# Patient Record
Sex: Female | Born: 1935 | Race: Black or African American | Hispanic: No | Marital: Single | Smoking: Former smoker
Health system: Southern US, Community
[De-identification: ages and names within clinical notes are randomized; demographics above are authoritative.]

## PROBLEM LIST (undated history)

## (undated) DIAGNOSIS — I2510 Atherosclerotic heart disease of native coronary artery without angina pectoris: Secondary | ICD-10-CM

## (undated) DIAGNOSIS — I10 Essential (primary) hypertension: Secondary | ICD-10-CM

## (undated) DIAGNOSIS — E785 Hyperlipidemia, unspecified: Secondary | ICD-10-CM

## (undated) HISTORY — DX: Essential (primary) hypertension: I10

## (undated) HISTORY — PX: ABDOMINAL HYSTERECTOMY: SHX81

## (undated) HISTORY — DX: Hyperlipidemia, unspecified: E78.5

## (undated) HISTORY — PX: CORONARY ARTERY BYPASS GRAFT: SHX141

## (undated) HISTORY — DX: Atherosclerotic heart disease of native coronary artery without angina pectoris: I25.10

---

## 2006-07-22 HISTORY — PX: CORONARY ARTERY BYPASS GRAFT: SHX141

## 2016-07-22 HISTORY — PX: CORONARY ANGIOPLASTY WITH STENT PLACEMENT: SHX49

## 2016-11-14 ENCOUNTER — Encounter: Payer: Self-pay | Admitting: Gastroenterology

## 2016-12-19 ENCOUNTER — Other Ambulatory Visit: Payer: Self-pay

## 2016-12-19 ENCOUNTER — Ambulatory Visit (INDEPENDENT_AMBULATORY_CARE_PROVIDER_SITE_OTHER): Payer: Medicare Other | Admitting: Gastroenterology

## 2016-12-19 ENCOUNTER — Encounter: Payer: Self-pay | Admitting: Gastroenterology

## 2016-12-19 DIAGNOSIS — D649 Anemia, unspecified: Secondary | ICD-10-CM | POA: Diagnosis not present

## 2016-12-19 DIAGNOSIS — R194 Change in bowel habit: Secondary | ICD-10-CM

## 2016-12-19 DIAGNOSIS — R634 Abnormal weight loss: Secondary | ICD-10-CM

## 2016-12-19 NOTE — Patient Instructions (Addendum)
IF YOU CONSUME DAIRY PRODUCTS(CHEESE, ICE CREAM), ADD LACTASE 3 PILLS WITH MEALS UP TO THREE TIMES A DAY.  STOP TAKING IRON.  TAKE CALTRATE 30 MINS AFTER SUPPER.  I WILL GET YOUR RECORDS FROM [facility].  COMPLETE COLONOSCOPY AND UPPER ENDOSCOPY WITHIN 3-4 WEEKS TO EVALUATE WEIGHT LOSS AND LOW BLOOD COUNT.  FOLLOW UP IN 3 MOS.

## 2016-12-19 NOTE — Progress Notes (Signed)
Subjective:    Patient ID: Julia Hart, female    DOB: 1926/09/23, 83 y.o.   MRN: [id number]  [name], MD  HPI Symptoms since Feb 2018. MOST TIME IT'S A DULL ACHE(ALL OVER) AND SOMETIMES BURNING. FIRST STARTED IT WAS CRAMPING AND NOW YOU FEEL PAIN IN YOUR RECTUM AND BELLY. PAIN HAPPENS OFF AND ON. STOOL SOFTENER TWICE A DAY MAKES HER HAVE A BM. HAPPENS: NO BAD EPISODE SINCE SAT. NO RADIATION.THOUGHT IT WAS CONSTIPATION. MAY MISS A DAY. HAD LOOSE STOOLS ALL THE TIME AFTER LINZESS. COULDN'T EAT AT ALL. WAS HAVING RABBIT BALLS BEFORE. HAD PROBLEMS WITH CONSTIPATION JUST SINCE FEB 2018.  MAY FEEL NAUSEATED. NO TASTE FOR WATER OR ANYTHING ELSE. EATS LIKE A BIRD. STOP EATING BECAUSE LOST THE TASTE FOR FOOD. WEIGHT: 167 LBS. THYROID TEST-NO. BEEN ON IRON PILL JUST THIS YEAR. AND STOPPED AND THEN RE-STARTED. CALTRATE IN THE EVENING. HEARTBURN: OCCASIONAL HEARTBURN/INDIGESTION. LAST TCS/EGD > 10 YEARS AGO. WAS GETTING B12 SHOTS. LAST BL;OOD DRAW LAST WEEK.  PT DENIES FEVER, CHILLS, HEMATOCHEZIA, vomiting, melena, CHEST PAIN, SHORTNESS OF BREATH,  CHANGE IN BOWEL IN HABITS, problems swallowing, problems with sedation, heartburn or indigestion.  Past Medical History:  Diagnosis Date  . CAD (coronary artery disease)   . HTN (hypertension)   . Hyperlipidemia     Past Surgical History:  Procedure Laterality Date  . ABDOMINAL HYSTERECTOMY    . CORONARY ARTERY BYPASS GRAFT      No Known Allergies  Current Outpatient Prescriptions  Medication Sig Dispense Refill  . amLODipine (NORVASC) 10 MG tablet Take 10 mg by mouth daily.    Marland Kitchen aspirin EC 81 MG tablet Take 81 mg by mouth daily.    Marland Kitchen atorvastatin (LIPITOR) 20 MG tablet Take 20 mg by mouth daily.    . Calcium Carbonate-Vitamin D (CALTRATE 600+D PO) Take by mouth.    . docusate sodium (COLACE) 100 MG capsule Take 100 mg by mouth 2 (two) times daily.    Marland Kitchen epoetin alfa (EPOGEN,PROCRIT) 28413 UNIT/ML injection Inject into the skin.    .  ferrous sulfate 325 (65 FE) MG tablet Take 325 mg by mouth daily with breakfast.    . latanoprost (XALATAN) 0.005 % ophthalmic solution Place 1 drop into both eyes at bedtime.    . metoprolol tartrate (LOPRESSOR) 25 MG tablet Take 25 mg by mouth 2 (two) times daily.    . Multiple Vitamin (MULTIVITAMIN) capsule Take 1 capsule by mouth daily.    . potassium chloride SA (K-DUR,KLOR-CON) 20 MEQ tablet Take 20 mEq by mouth once.    . ranitidine (ZANTAC) 150 MG tablet Take 150 mg by mouth 2 (two) times daily.    . valsartan-hydrochlorothiazide (DIOVAN-HCT) 160-12.5 MG tablet Take 1 tablet by mouth daily.     Family History  Problem Relation Age of Onset  . Colon cancer Neg Hx   . Colon polyps Neg Hx     Social History   Social History  . Marital status: Single    Spouse name: N/A  . Number of children: N/A  . Years of education: N/A   Social History Main Topics  . Smoking status: Former Research scientist (life sciences)  . Smokeless tobacco: Never Used  . Alcohol use No  . Drug use: No  . Sexual activity: Not Asked   Other Topics Concern  . None   Social History Narrative   QUIT SMOKING LONG TIME AGO. WORKED [employer]. NO KIDS. NIECE HELPS CARE FOR HER.   Review of Systems PER  HPI OTHERWISE ALL SYSTEMS ARE NEGATIVE.    Objective:   Physical Exam  Constitutional: She is oriented to person, place, and time. She appears well-developed and well-nourished. No distress.  HENT:  Head: Normocephalic and atraumatic.  Mouth/Throat: Oropharynx is clear and moist. No oropharyngeal exudate.  Eyes: Pupils are equal, round, and reactive to light. No scleral icterus.  Neck: Normal range of motion. Neck supple.  Cardiovascular: Normal rate, regular rhythm and normal heart sounds.   Pulmonary/Chest: Effort normal and breath sounds normal. No respiratory distress.  Abdominal: Soft. Bowel sounds are normal. She exhibits no distension. There is no tenderness.  Musculoskeletal: She exhibits no edema.    Lymphadenopathy:    She has no cervical adenopathy.  Neurological: She is alert and oriented to person, place, and time.  NO  NEW FOCAL DEFICITS, HARD OF HEARING  Psychiatric: She has a normal mood and affect.  Vitals reviewed.     Assessment & Plan:

## 2016-12-19 NOTE — Progress Notes (Signed)
cc'ed to pcp °

## 2016-12-19 NOTE — Assessment & Plan Note (Addendum)
NORMAL TO CONSTIPATION-ASSOCIATED WITH GI UPSET DUE TO LACTOSE, IRON,  AND CALTRATE.   IF YOU CONSUME DAIRY PRODUCTS(CHEESE, ICE CREAM), ADD LACTASE 3 PILLS WITH MEALS UP TO THREE TIMES A DAY. STOP TAKING IRON. TAKE CALTRATE 30 MINS AFTER SUPPER. I WILL GET YOUR RECORDS FROM [facility]. FOLLOW UP IN 3 MOS. Marland Kitchen

## 2016-12-19 NOTE — Progress Notes (Signed)
ON RECALL  °

## 2016-12-19 NOTE — Assessment & Plan Note (Signed)
LIKELY MULTIFACTORIAL-POLYPS AND CRI.  STOP TAKING IRON. TAKE CALTRATE 30 MINS AFTER SUPPER. I WILL GET YOUR RECORDS FROM [facility]. COMPLETE COLONOSCOPY AND UPPER ENDOSCOPY WITHIN 3-4 WEEKS. DISCUSSED PROCEDURE, BENEFITS, & RISKS: < 1% chance of medication reaction, bleeding, perforation, or rupture of spleen/liver. MOVIPREP SPLIT-FENTANYL/VERSED FOR SEDATION.  FOLLOW UP IN 3 MOS.

## 2017-01-09 ENCOUNTER — Telehealth: Payer: Self-pay

## 2017-01-09 NOTE — Telephone Encounter (Signed)
Talked with Hassan Rowan and she is aware to have her there Monday at 12:45 pm

## 2017-01-09 NOTE — Telephone Encounter (Signed)
Talked with patient about moving her TCS up on 01/13/17 and she asked that I call her niece Hassan Rowan. I left a message for Hassan Rowan to call me back.

## 2017-01-13 ENCOUNTER — Encounter (HOSPITAL_COMMUNITY): Payer: Self-pay

## 2017-01-13 ENCOUNTER — Encounter (HOSPITAL_COMMUNITY)
Admission: RE | Disposition: A | Discharge status: Home / Self Care | Payer: Self-pay | Source: Ambulatory Visit | Attending: Gastroenterology

## 2017-01-13 ENCOUNTER — Ambulatory Visit (HOSPITAL_COMMUNITY)
Admission: RE | Admit: 2017-01-13 | Discharge: 2017-01-13 | Disposition: A | Discharge status: Home / Self Care | Payer: Medicare Other | Source: Ambulatory Visit | Attending: Gastroenterology | Admitting: Gastroenterology

## 2017-01-13 DIAGNOSIS — Z87891 Personal history of nicotine dependence: Secondary | ICD-10-CM | POA: Insufficient documentation

## 2017-01-13 DIAGNOSIS — N189 Chronic kidney disease, unspecified: Secondary | ICD-10-CM | POA: Insufficient documentation

## 2017-01-13 DIAGNOSIS — K2950 Unspecified chronic gastritis without bleeding: Secondary | ICD-10-CM | POA: Insufficient documentation

## 2017-01-13 DIAGNOSIS — Z90710 Acquired absence of both cervix and uterus: Secondary | ICD-10-CM | POA: Insufficient documentation

## 2017-01-13 DIAGNOSIS — I129 Hypertensive chronic kidney disease with stage 1 through stage 4 chronic kidney disease, or unspecified chronic kidney disease: Secondary | ICD-10-CM | POA: Diagnosis not present

## 2017-01-13 DIAGNOSIS — E538 Deficiency of other specified B group vitamins: Secondary | ICD-10-CM | POA: Insufficient documentation

## 2017-01-13 DIAGNOSIS — D649 Anemia, unspecified: Secondary | ICD-10-CM | POA: Diagnosis not present

## 2017-01-13 DIAGNOSIS — D631 Anemia in chronic kidney disease: Secondary | ICD-10-CM | POA: Diagnosis not present

## 2017-01-13 DIAGNOSIS — K2970 Gastritis, unspecified, without bleeding: Secondary | ICD-10-CM

## 2017-01-13 DIAGNOSIS — D124 Benign neoplasm of descending colon: Secondary | ICD-10-CM | POA: Insufficient documentation

## 2017-01-13 DIAGNOSIS — R1013 Epigastric pain: Secondary | ICD-10-CM | POA: Diagnosis not present

## 2017-01-13 DIAGNOSIS — Z951 Presence of aortocoronary bypass graft: Secondary | ICD-10-CM | POA: Insufficient documentation

## 2017-01-13 DIAGNOSIS — K648 Other hemorrhoids: Secondary | ICD-10-CM | POA: Insufficient documentation

## 2017-01-13 DIAGNOSIS — Z7982 Long term (current) use of aspirin: Secondary | ICD-10-CM | POA: Insufficient documentation

## 2017-01-13 DIAGNOSIS — E785 Hyperlipidemia, unspecified: Secondary | ICD-10-CM | POA: Insufficient documentation

## 2017-01-13 DIAGNOSIS — I2510 Atherosclerotic heart disease of native coronary artery without angina pectoris: Secondary | ICD-10-CM | POA: Diagnosis not present

## 2017-01-13 DIAGNOSIS — Q438 Other specified congenital malformations of intestine: Secondary | ICD-10-CM | POA: Insufficient documentation

## 2017-01-13 DIAGNOSIS — R634 Abnormal weight loss: Secondary | ICD-10-CM | POA: Diagnosis not present

## 2017-01-13 DIAGNOSIS — K635 Polyp of colon: Secondary | ICD-10-CM

## 2017-01-13 DIAGNOSIS — R109 Unspecified abdominal pain: Secondary | ICD-10-CM | POA: Diagnosis not present

## 2017-01-13 DIAGNOSIS — K5730 Diverticulosis of large intestine without perforation or abscess without bleeding: Secondary | ICD-10-CM | POA: Insufficient documentation

## 2017-01-13 HISTORY — PX: ESOPHAGOGASTRODUODENOSCOPY: SHX5428

## 2017-01-13 HISTORY — PX: BIOPSY: SHX5522

## 2017-01-13 HISTORY — PX: COLONOSCOPY: SHX5424

## 2017-01-13 LAB — CBC
HCT: 26.1 % — ABNORMAL LOW (ref 36.0–46.0)
HEMOGLOBIN: 8.7 g/dL — AB (ref 12.0–15.0)
MCH: 28.1 pg (ref 26.0–34.0)
MCHC: 33.3 g/dL (ref 30.0–36.0)
MCV: 84.2 fL (ref 78.0–100.0)
Platelets: 210 10*3/uL (ref 150–400)
RBC: 3.1 MIL/uL — AB (ref 3.87–5.11)
RDW: 14.2 % (ref 11.5–15.5)
WBC: 5.9 10*3/uL (ref 4.0–10.5)

## 2017-01-13 SURGERY — COLONOSCOPY
Anesthesia: Moderate Sedation

## 2017-01-13 MED ORDER — MINERAL OIL PO OIL
TOPICAL_OIL | ORAL | Status: AC
  Filled 2017-01-13: qty 30

## 2017-01-13 MED ORDER — MEPERIDINE HCL 100 MG/ML IJ SOLN
INTRAMUSCULAR | Status: AC
  Filled 2017-01-13: qty 2

## 2017-01-13 MED ORDER — MEPERIDINE HCL 100 MG/ML IJ SOLN
INTRAMUSCULAR | Status: DC | PRN
  Administered 2017-01-13 (×2): 25 mg via INTRAVENOUS

## 2017-01-13 MED ORDER — MIDAZOLAM HCL 5 MG/5ML IJ SOLN
INTRAMUSCULAR | Status: AC
  Filled 2017-01-13: qty 10

## 2017-01-13 MED ORDER — LIDOCAINE VISCOUS 2 % MT SOLN
OROMUCOSAL | Status: AC
  Filled 2017-01-13: qty 15

## 2017-01-13 MED ORDER — MIDAZOLAM HCL 5 MG/5ML IJ SOLN
INTRAMUSCULAR | Status: DC | PRN
  Administered 2017-01-13 (×3): 1 mg via INTRAVENOUS
  Administered 2017-01-13: 2 mg via INTRAVENOUS

## 2017-01-13 MED ORDER — STERILE WATER FOR IRRIGATION IR SOLN
Status: DC | PRN
  Administered 2017-01-13: 15:00:00

## 2017-01-13 MED ORDER — SODIUM CHLORIDE 0.9 % IV SOLN
INTRAVENOUS | Status: DC
  Administered 2017-01-13: 1000 mL via INTRAVENOUS

## 2017-01-13 NOTE — H&P (View-Only) (Signed)
Subjective:    Patient ID: Julia Hart, female    DOB: 21-Aug-1926, 83 y.o.   MRN: [id number]  [name], MD  HPI Symptoms since Feb 2018. MOST TIME IT'S A DULL ACHE(ALL OVER) AND SOMETIMES BURNING. FIRST STARTED IT WAS CRAMPING AND NOW YOU FEEL PAIN IN YOUR RECTUM AND BELLY. PAIN HAPPENS OFF AND ON. STOOL SOFTENER TWICE A DAY MAKES HER HAVE A BM. HAPPENS: NO BAD EPISODE SINCE SAT. NO RADIATION.THOUGHT IT WAS CONSTIPATION. MAY MISS A DAY. HAD LOOSE STOOLS ALL THE TIME AFTER LINZESS. COULDN'T EAT AT ALL. WAS HAVING RABBIT BALLS BEFORE. HAD PROBLEMS WITH CONSTIPATION JUST SINCE FEB 2018.  MAY FEEL NAUSEATED. NO TASTE FOR WATER OR ANYTHING ELSE. EATS LIKE A BIRD. STOP EATING BECAUSE LOST THE TASTE FOR FOOD. WEIGHT: 167 LBS. THYROID TEST-NO. BEEN ON IRON PILL JUST THIS YEAR. AND STOPPED AND THEN RE-STARTED. CALTRATE IN THE EVENING. HEARTBURN: OCCASIONAL HEARTBURN/INDIGESTION. LAST TCS/EGD > 10 YEARS AGO. WAS GETTING B12 SHOTS. LAST BL;OOD DRAW LAST WEEK.  PT DENIES FEVER, CHILLS, HEMATOCHEZIA, vomiting, melena, CHEST PAIN, SHORTNESS OF BREATH,  CHANGE IN BOWEL IN HABITS, problems swallowing, problems with sedation, heartburn or indigestion.  Past Medical History:  Diagnosis Date  . CAD (coronary artery disease)   . HTN (hypertension)   . Hyperlipidemia     Past Surgical History:  Procedure Laterality Date  . ABDOMINAL HYSTERECTOMY    . CORONARY ARTERY BYPASS GRAFT      No Known Allergies  Current Outpatient Prescriptions  Medication Sig Dispense Refill  . amLODipine (NORVASC) 10 MG tablet Take 10 mg by mouth daily.    Marland Kitchen aspirin EC 81 MG tablet Take 81 mg by mouth daily.    Marland Kitchen atorvastatin (LIPITOR) 20 MG tablet Take 20 mg by mouth daily.    . Calcium Carbonate-Vitamin D (CALTRATE 600+D PO) Take by mouth.    . docusate sodium (COLACE) 100 MG capsule Take 100 mg by mouth 2 (two) times daily.    Marland Kitchen epoetin alfa (EPOGEN,PROCRIT) 78295 UNIT/ML injection Inject into the skin.    .  ferrous sulfate 325 (65 FE) MG tablet Take 325 mg by mouth daily with breakfast.    . latanoprost (XALATAN) 0.005 % ophthalmic solution Place 1 drop into both eyes at bedtime.    . metoprolol tartrate (LOPRESSOR) 25 MG tablet Take 25 mg by mouth 2 (two) times daily.    . Multiple Vitamin (MULTIVITAMIN) capsule Take 1 capsule by mouth daily.    . potassium chloride SA (K-DUR,KLOR-CON) 20 MEQ tablet Take 20 mEq by mouth once.    . ranitidine (ZANTAC) 150 MG tablet Take 150 mg by mouth 2 (two) times daily.    . valsartan-hydrochlorothiazide (DIOVAN-HCT) 160-12.5 MG tablet Take 1 tablet by mouth daily.     Family History  Problem Relation Age of Onset  . Colon cancer Neg Hx   . Colon polyps Neg Hx     Social History   Social History  . Marital status: Single    Spouse name: N/A  . Number of children: N/A  . Years of education: N/A   Social History Main Topics  . Smoking status: Former Research scientist (life sciences)  . Smokeless tobacco: Never Used  . Alcohol use No  . Drug use: No  . Sexual activity: Not Asked   Other Topics Concern  . None   Social History Narrative   QUIT SMOKING LONG TIME AGO. WORKED [employer]. NO KIDS. NIECE HELPS CARE FOR HER.   Review of Systems PER  HPI OTHERWISE ALL SYSTEMS ARE NEGATIVE.    Objective:   Physical Exam  Constitutional: She is oriented to person, place, and time. She appears well-developed and well-nourished. No distress.  HENT:  Head: Normocephalic and atraumatic.  Mouth/Throat: Oropharynx is clear and moist. No oropharyngeal exudate.  Eyes: Pupils are equal, round, and reactive to light. No scleral icterus.  Neck: Normal range of motion. Neck supple.  Cardiovascular: Normal rate, regular rhythm and normal heart sounds.   Pulmonary/Chest: Effort normal and breath sounds normal. No respiratory distress.  Abdominal: Soft. Bowel sounds are normal. She exhibits no distension. There is no tenderness.  Musculoskeletal: She exhibits no edema.    Lymphadenopathy:    She has no cervical adenopathy.  Neurological: She is alert and oriented to person, place, and time.  NO  NEW FOCAL DEFICITS, HARD OF HEARING  Psychiatric: She has a normal mood and affect.  Vitals reviewed.     Assessment & Plan:

## 2017-01-13 NOTE — Op Note (Signed)
[facility] Patient Name: Julia Hart Procedure Date: 01/13/2017 2:30 PM MRN: [id number] Date of Birth: [birth year] Attending MD: Barney Drain , MD CSN: [id number] Age: 83 Admit Type: Outpatient Procedure:                Colonoscopy WITH COLD FORCEPS POLYPECTOMY Indications:              NORMOCYTIC Anemia, Weight loss, ABDOMINAL PAIN                            AFTER TAKING IRON PO. PMHx: CRI AND B12 DEFICIENCY Providers:                Barney Drain, MD, Otis Peak B. Sharon Seller, RN, Randa Spike, Technician Referring MD:             [name] Medicines:                Meperidine 50 mg IV, Midazolam 4 mg IV Complications:            No immediate complications. Estimated Blood Loss:     Estimated blood loss was minimal. Procedure:                Pre-Anesthesia Assessment:                           - Prior to the procedure, a History and Physical                            was performed, and patient medications and                            allergies were reviewed. The patient's tolerance of                            previous anesthesia was also reviewed. The risks                            and benefits of the procedure and the sedation                            options and risks were discussed with the patient.                            All questions were answered, and informed consent                            was obtained. Prior Anticoagulants: The patient has                            taken aspirin, last dose was 1 day prior to                            procedure. ASA Grade Assessment: II - A patient  with mild systemic disease. After reviewing the                            risks and benefits, the patient was deemed in                            satisfactory condition to undergo the procedure.                            After obtaining informed consent, the colonoscope                            was passed under  direct vision. Throughout the                            procedure, the patient's blood pressure, pulse, and                            oxygen saturations were monitored continuously. The                            EC-[id number] ([id number]) scope was introduced through                            the anus and advanced to the 4 cm into the ileum.                            The colonoscopy was technically difficult and                            complex due to a tortuous colon. Successful                            completion of the procedure was aided by COLOWRAP.                            The patient tolerated the procedure fairly well.                            The quality of the bowel preparation was excellent.                            The terminal ileum, ileocecal valve, appendiceal                            orifice, and rectum were photographed. Scope In: 2:55:21 PM Scope Out: 3:11:25 PM Scope Withdrawal Time: 0 hours 11 minutes 12 seconds  Total Procedure Duration: 0 hours 16 minutes 4 seconds  Findings:      The terminal ileum appeared normal.      Two sessile polyps were found in the descending colon. The polyps were 2       to 3 mm in size. These polyps were removed with a cold biopsy forceps.  Resection and retrieval were complete.      Multiple small and large-mouthed diverticula were found in the entire       colon.      Internal hemorrhoids were found during retroflexion. The hemorrhoids       were mild. Impression:               - The examined portion of the ileum was normal.                           - Two 2 to 3 mm polyps in the descending colon,                            removed with a cold biopsy forceps. Resected and                            retrieved.                           - Diverticulosis in the entire examined colon.                           - Internal hemorrhoids.                           - NO SOURCE FOR WEIGHT LOSS/ANEMIA IDENTIFIED Moderate  Sedation:      Moderate (conscious) sedation was administered by the endoscopy nurse       and supervised by the endoscopist. The following parameters were       monitored: oxygen saturation, heart rate, blood pressure, and response       to care. Total physician intraservice time was 55 minutes. Recommendation:           - Resume previous diet.                           - Continue present medications.                           - Await pathology results.                           - Return to my office in 2 months.                           - Patient has a contact number available for                            emergencies. The signs and symptoms of potential                            delayed complications were discussed with the                            patient. Return to normal activities tomorrow.                            Written  discharge instructions were provided to the                            patient.                           - No repeat colonoscopy due to age. Procedure Code(s):        --- Professional ---                           [phone], Colonoscopy, flexible; with biopsy, single                            or multiple                           99152, Moderate sedation services provided by the                            same physician or other qualified health care                            professional performing the diagnostic or                            therapeutic service that the sedation supports,                            requiring the presence of an independent trained                            observer to assist in the monitoring of the                            patient's level of consciousness and physiological                            status; initial 15 minutes of intraservice time,                            patient age 84 years or older                           [phone], Moderate sedation services; each additional                            15 minutes  intraservice time                           99153, Moderate sedation services; each additional                            15 minutes intraservice time                           99153, Moderate sedation services; each additional  15 minutes intraservice time Diagnosis Code(s):        --- Professional ---                           K64.8, Other hemorrhoids                           D12.4, Benign neoplasm of descending colon                           D64.9, Anemia, unspecified                           R63.4, Abnormal weight loss                           K57.30, Diverticulosis of large intestine without                            perforation or abscess without bleeding CPT copyright 2016 [employer]. All rights reserved. The codes documented in this report are preliminary and upon coder review may  be revised to meet current compliance requirements. Barney Drain, MD Barney Drain, MD 01/13/2017 3:39:56 PM This report has been signed electronically. Number of Addenda: 0

## 2017-01-13 NOTE — Discharge Instructions (Signed)
You had 2 polyps removed. You have internal hemorrhoids & DIVERTICULOSIS IN YOUR RIGHT AND LEFT COLON. THE LAST PART OF YOUR SMALL BOWEL IS NORMAL. You have MILD gastritis.  I biopsied your stomach AND SMALL BOWEL.    I AM CHECKING YOUR BLOOD COUNT, IRON STORES, AND VITAMIN B12 LEVEL TODAY.  DRINK WATER TO KEEP YOUR URINE LIGHT YELLOW.  FOLLOW A HIGH FIBER DIET. AVOID ITEMS THAT CAUSE BLOATING. SEE INFO BELOW.  YOUR BIOPSY RESULTS WILL BE AVAILABLE IN MY CHART AFTER JUN 28 AND MY OFFICE WILL CONTACT YOU IN 10-14 DAYS WITH YOUR RESULTS.   FOLLOW UP IN AUG 2018.   ENDOSCOPY Care After Read the instructions outlined below and refer to this sheet in the next week. These discharge instructions provide you with general information on caring for yourself after you leave the hospital. While your treatment has been planned according to the most current medical practices available, unavoidable complications occasionally occur. If you have any problems or questions after discharge, call DR. Sovereign Ramiro, [phone].  ACTIVITY  You may resume your regular activity, but move at a slower pace for the next 24 hours.   Take frequent rest periods for the next 24 hours.   Walking will help get rid of the air and reduce the bloated feeling in your belly (abdomen).   No driving for 24 hours (because of the medicine (anesthesia) used during the test).   You may shower.   Do not sign any important legal documents or operate any machinery for 24 hours (because of the anesthesia used during the test).    NUTRITION  Drink plenty of fluids.   You may resume your normal diet as instructed by your doctor.   Begin with a light meal and progress to your normal diet. Heavy or fried foods are harder to digest and may make you feel sick to your stomach (nauseated).   Avoid alcoholic beverages for 24 hours or as instructed.    MEDICATIONS  You may resume your normal medications.   WHAT YOU CAN EXPECT  TODAY  Some feelings of bloating in the abdomen.   Passage of more gas than usual.   Spotting of blood in your stool or on the toilet paper  .  IF YOU HAD POLYPS REMOVED DURING THE ENDOSCOPY:  Eat a soft diet IF YOU HAVE NAUSEA, BLOATING, ABDOMINAL PAIN, OR VOMITING.    FINDING OUT THE RESULTS OF YOUR TEST Not all test results are available during your visit. DR. Oneida Alar WILL CALL YOU WITHIN 7 DAYS OF YOUR PROCEDUE WITH YOUR RESULTS. Do not assume everything is normal if you have not heard from DR. Jacere Pangborn IN ONE WEEK, CALL HER OFFICE AT [phone].  SEEK IMMEDIATE MEDICAL ATTENTION AND CALL THE OFFICE: [phone] IF:  You have more than a spotting of blood in your stool.   Your belly is swollen (abdominal distention).   You are nauseated or vomiting.   You have a temperature over 101F.   You have abdominal pain or discomfort that is severe or gets worse throughout the day.   Gastritis/DUODENITIS  Gastritis/DUODENITIS is inflammation (the body's way of reacting to injury and/or infection) of the stomach/SMALLBOWEL. It is often caused by viral or bacterial (germ) infections. It can also be caused BY ASPIRIN, BC/GOODY POWDER'S, (IBUPROFEN) MOTRIN, OR ALEVE (NAPROXEN), chemicals (including alcohol), SPICY FOODS, and medications. This illness may be associated with generalized malaise (feeling tired, not well), UPPER ABDOMINAL STOMACH cramps, and fever. One common bacterial cause of gastritis  is an organism known as H. Pylori. This can be treated with antibiotics.    High-Fiber Diet A high-fiber diet changes your normal diet to include more whole grains, legumes, fruits, and vegetables. Changes in the diet involve replacing refined carbohydrates with unrefined foods. The calorie level of the diet is essentially unchanged. The Dietary Reference Intake (recommended amount) for adult males is 38 grams per day. For adult females, it is 25 grams per day. Pregnant and lactating women  should consume 28 grams of fiber per day. Fiber is the intact part of a plant that is not broken down during digestion. Functional fiber is fiber that has been isolated from the plant to provide a beneficial effect in the body. PURPOSE  Increase stool bulk.   Ease and regulate bowel movements.   Lower cholesterol.  INDICATIONS THAT YOU NEED MORE FIBER  Constipation and hemorrhoids.   Uncomplicated diverticulosis (intestine condition) and irritable bowel syndrome.   Weight management.   As a protective measure against hardening of the arteries (atherosclerosis), diabetes, and cancer.   GUIDELINES FOR INCREASING FIBER IN THE DIET  Start adding fiber to the diet slowly. A gradual increase of about 5 more grams (2 slices of whole-wheat bread, 2 servings of most fruits or vegetables, or 1 bowl of high-fiber cereal) per day is best. Too rapid an increase in fiber may result in constipation, flatulence, and bloating.   Drink enough water and fluids to keep your urine clear or pale yellow. Water, juice, or caffeine-free drinks are recommended. Not drinking enough fluid may cause constipation.   Eat a variety of high-fiber foods rather than one type of fiber.   Try to increase your intake of fiber through using high-fiber foods rather than fiber pills or supplements that contain small amounts of fiber.   The goal is to change the types of food eaten. Do not supplement your present diet with high-fiber foods, but replace foods in your present diet.  INCLUDE A VARIETY OF FIBER SOURCES  Replace refined and processed grains with whole grains, canned fruits with fresh fruits, and incorporate other fiber sources. White rice, white breads, and most bakery goods contain little or no fiber.   Brown whole-grain rice, buckwheat oats, and many fruits and vegetables are all good sources of fiber. These include: broccoli, Brussels sprouts, cabbage, cauliflower, beets, sweet potatoes, white potatoes (skin  on), carrots, tomatoes, eggplant, squash, berries, fresh fruits, and dried fruits.   Cereals appear to be the richest source of fiber. Cereal fiber is found in whole grains and bran. Bran is the fiber-rich outer coat of cereal grain, which is largely removed in refining. In whole-grain cereals, the bran remains. In breakfast cereals, the largest amount of fiber is found in those with "bran" in their names. The fiber content is sometimes indicated on the label.   You may need to include additional fruits and vegetables each day.   In baking, for 1 cup white flour, you may use the following substitutions:   1 cup whole-wheat flour minus 2 tablespoons.   1/2 cup white flour plus 1/2 cup whole-wheat flour.   Diverticulosis Diverticulosis is a common condition that develops when small pouches (diverticula) form in the wall of the colon. The risk of diverticulosis increases with age. It happens more often in people who eat a low-fiber diet. Most individuals with diverticulosis have no symptoms. Those individuals with symptoms usually experience belly (abdominal) pain, constipation, or loose stools (diarrhea).  HOME CARE INSTRUCTIONS  Increase the  amount of fiber in your diet as directed by your caregiver or dietician. This may reduce symptoms of diverticulosis.   Drink at least 6 to 8 glasses of water each day to prevent constipation.   Try not to strain when you have a bowel movement.   Avoiding nuts and seeds to prevent complications is NOT NECESSARY.     FOODS HAVING HIGH FIBER CONTENT INCLUDE:  Fruits. Apple, peach, pear, tangerine, raisins, prunes.   Vegetables. Brussels sprouts, asparagus, broccoli, cabbage, carrot, cauliflower, romaine lettuce, spinach, summer squash, tomato, winter squash, zucchini.   Starchy Vegetables. Baked beans, kidney beans, lima beans, split peas, lentils, potatoes (with skin).   Grains. Whole wheat bread, brown rice, bran flake cereal, plain oatmeal, white  rice, shredded wheat, bran muffins.   Polyps, Colon  A polyp is extra tissue that grows inside your body. Colon polyps grow in the large intestine. The large intestine, also called the colon, is part of your digestive system. It is a long, hollow tube at the end of your digestive tract where your body makes and stores stool.Most polyps are not dangerous. They are benign. This means they are not cancerous. But over time, some types of polyps can turn into cancer. Polyps that are smaller than a pea are usually not harmful. But larger polyps could someday become or may already be cancerous. To be safe, doctors remove all polyps and test them.    PREVENTION There is not one sure way to prevent polyps. You might be able to lower your risk of getting them if you:  Eat more fruits and vegetables and less fatty food.   Do not smoke.   Avoid alcohol.   Exercise every day.   Lose weight if you are overweight.   Eating more calcium and folate can also lower your risk of getting polyps. Some foods that are rich in calcium are milk, cheese, and broccoli. Some foods that are rich in folate are chickpeas, kidney beans, and spinach.

## 2017-01-13 NOTE — Interval H&P Note (Signed)
History and Physical Interval Note:  01/13/2017 1:45 PM  Julia Hart  has presented today for surgery, with the diagnosis of anemia, weight loss  The various methods of treatment have been discussed with the patient and family. After consideration of risks, benefits and other options for treatment, the patient has consented to  Procedure(s) with comments: COLONOSCOPY (N/A) - 3:00pm-moved to 1:45pm per Ginger ESOPHAGOGASTRODUODENOSCOPY (EGD) (N/A) as a surgical intervention .  The patient's history has been reviewed, patient examined, no change in status, stable for surgery.  I have reviewed the patient's chart and labs.  Questions were answered to the patient's satisfaction.     [employer]

## 2017-01-13 NOTE — Op Note (Signed)
[facility] Patient Name: Julia Hart Procedure Date: 01/13/2017 3:14 PM MRN: [id number] Date of Birth: [birth year] Attending MD: Barney Drain , MD CSN: [id number] Age: 83 Admit Type: Outpatient Procedure:                Upper GI endoscopy WITH COLD FORCEPS BIOPSY Indications:              Dyspepsia, Anemia Providers:                Barney Drain, MD, Gwenlyn Fudge RN, RN, Randa Spike, Technician Referring MD:             [name] Medicines:                TCS + Midazolam 1 mg IV Complications:            No immediate complications. Estimated Blood Loss:     Estimated blood loss was minimal. Procedure:                Pre-Anesthesia Assessment:                           - Prior to the procedure, a History and Physical                            was performed, and patient medications and                            allergies were reviewed. The patient's tolerance of                            previous anesthesia was also reviewed. The risks                            and benefits of the procedure and the sedation                            options and risks were discussed with the patient.                            All questions were answered, and informed consent                            was obtained. Prior Anticoagulants: The patient has                            taken aspirin, last dose was 1 day prior to                            procedure. ASA Grade Assessment: II - A patient                            with mild systemic disease. After reviewing the  risks and benefits, the patient was deemed in                            satisfactory condition to undergo the procedure.                            After obtaining informed consent, the endoscope was                            passed under direct vision. Throughout the                            procedure, the patient's blood pressure, pulse, and           oxygen saturations were monitored continuously. The                            [id number]) scope was introduced through the                            mouth, and advanced to the second part of duodenum.                            The upper GI endoscopy was accomplished without                            difficulty. The patient tolerated the procedure                            well. Scope In: 3:18:48 PM Scope Out: 3:28:06 PM Total Procedure Duration: 0 hours 9 minutes 18 seconds  Findings:      The examined esophagus was normal.      Patchy mild inflammation characterized by congestion (edema), erosions       and erythema was found in the gastric antrum. Biopsies were taken with a       cold forceps for Helicobacter pylori testing.      The examined duodenum was normal. Biopsies for histology were taken with       a cold forceps for evaluation of celiac disease. Impression:               - Normal esophagus.                           - Gastritis. Biopsied.                           - ANEMIA MOST LIKELY DUE TO B12 DEFICIENCY AND                            CHRONIC RENAL INSUFFICIENCY Moderate Sedation:      Moderate (conscious) sedation was administered by the endoscopy nurse       and supervised by the endoscopist. The following parameters were       monitored: oxygen saturation, heart rate, blood pressure, and response       to care. Total physician intraservice time was 55 minutes. Recommendation:           -  Await pathology results. CHECK                            CBC/FERRITIN/VITAMIN B12 LEVEL TODAY                           - Resume previous diet.                           - Continue present medications.CONITNUE EPOGEN PRN.                            HOLD PO IRON. MAY NEED REFERRAL TO HEMATOLOGY FOR                            PARENTERL B12.                           - Return to my office in 2 months.                           - Patient has a contact number available  for                            emergencies. The signs and symptoms of potential                            delayed complications were discussed with the                            patient. Return to normal activities tomorrow.                            Written discharge instructions were provided to the                            patient. Procedure Code(s):        --- Professional ---                           [phone], Esophagogastroduodenoscopy, flexible,                            transoral; with biopsy, single or multiple                           99152, Moderate sedation services provided by the                            same physician or other qualified health care                            professional performing the diagnostic or                            therapeutic service that the sedation supports,  requiring the presence of an independent trained                            observer to assist in the monitoring of the                            patient's level of consciousness and physiological                            status; initial 15 minutes of intraservice time,                            patient age 43 years or older                           [phone], Moderate sedation services; each additional                            15 minutes intraservice time                           99153, Moderate sedation services; each additional                            15 minutes intraservice time                           99153, Moderate sedation services; each additional                            15 minutes intraservice time Diagnosis Code(s):        --- Professional ---                           K29.70, Gastritis, unspecified, without bleeding                           R10.13, Epigastric pain                           D64.9, Anemia, unspecified CPT copyright 2016 American [employer] Association. All rights reserved. The codes documented in this report are  preliminary and upon coder review may  be revised to meet current compliance requirements. Barney Drain, MD Barney Drain, MD 01/13/2017 3:43:56 PM This report has been signed electronically. Number of Addenda: 0

## 2017-01-17 ENCOUNTER — Encounter (HOSPITAL_COMMUNITY): Payer: Self-pay | Admitting: Gastroenterology

## 2017-01-26 ENCOUNTER — Telehealth: Payer: Self-pay | Admitting: Gastroenterology

## 2017-01-26 DIAGNOSIS — D649 Anemia, unspecified: Secondary | ICD-10-CM

## 2017-01-26 NOTE — Telephone Encounter (Addendum)
Please call pt. She had a BENIGN polypoid lesion removed and it was benign.  Please call pt. HER stomacH/SMALL BOWEL BIOPSIES shows gastritis & DUODENITIS. SHE NEEDS A VITAMIN B12 AND FERRITIN LEVEL DRAWN BECAUSE HER BLOOD COUNT IS LOW(8.7). OPV W/ [facility] IN SEP 2018 E30 NORMOCYTIC ANEMIA. CANCEL APPT W/ EG AUG 2018.

## 2017-01-27 ENCOUNTER — Other Ambulatory Visit: Payer: Self-pay

## 2017-01-27 ENCOUNTER — Encounter: Payer: Self-pay | Admitting: Gastroenterology

## 2017-01-27 DIAGNOSIS — D649 Anemia, unspecified: Secondary | ICD-10-CM

## 2017-01-27 NOTE — Telephone Encounter (Signed)
LMOM to call.

## 2017-01-27 NOTE — Telephone Encounter (Signed)
APPT CHANGED AND LETTER SENT

## 2017-01-28 NOTE — Telephone Encounter (Signed)
[name], Santo Held called and was informed of results and to do labs in the next day or so.

## 2017-01-28 NOTE — Telephone Encounter (Signed)
I had to write out the order for the Vit B12 and faxed with the other order for the ferritin to [facility]. Pt will probably have to sign a waiver.

## 2017-01-30 LAB — FERRITIN: Ferritin: 314 ng/mL — ABNORMAL HIGH (ref 20–288)

## 2017-02-04 ENCOUNTER — Telehealth: Payer: Self-pay | Admitting: Gastroenterology

## 2017-02-04 ENCOUNTER — Other Ambulatory Visit: Payer: Self-pay

## 2017-02-04 DIAGNOSIS — D649 Anemia, unspecified: Secondary | ICD-10-CM

## 2017-02-04 NOTE — Telephone Encounter (Signed)
PLEASE CALL PT. HER IRON STORES ARE NORMAL. SHE DOES NOT NEED IRON. WE ARE WAITING ON HER VITAMIN B12 RESULTS. REPEAT CBC/FERRITIN IN 3 MOS.

## 2017-02-04 NOTE — Telephone Encounter (Signed)
Pt is aware and lab orders on file for 04/2017.

## 2017-02-10 ENCOUNTER — Telehealth: Payer: Self-pay | Admitting: Gastroenterology

## 2017-02-10 NOTE — Telephone Encounter (Signed)
COMPLETE VITAMIN B12 LEVEL.

## 2017-02-10 NOTE — Telephone Encounter (Signed)
A lady named Mining engineer called to speak with the nurse about patient's lab results. Please call [phone] she said that she will be out some this afternoon to help plan funeral arrangements and to leave a message if she doesn't answer.

## 2017-02-10 NOTE — Telephone Encounter (Signed)
LMOM for a return call.  

## 2017-02-10 NOTE — Telephone Encounter (Signed)
Call LAB FOR B12 RESULT.

## 2017-02-10 NOTE — Telephone Encounter (Signed)
I spoke with Santo Held and she wanted to know about the patient's Hgb.    The patient has also been experiencing some weakness for about a week.  The patient usually would receive B12 shots from her pcp.  She can be reached at [phone].  Routing to Dr. Oneida Alar.

## 2017-02-10 NOTE — Telephone Encounter (Signed)
I called and spoke to [facility].  ( see separate note) I had to fax the order because the computer would not let me put it in.  Someone had put the order in the drawer when it came off of the fax and it did not get done.  They said pt can come by in and they will draw and just bill her insurance. They will not require payment that day.  Does pt need anything else?

## 2017-02-11 NOTE — Telephone Encounter (Signed)
SHE SHOULD WAIT UNTIL THE B12 LEVEL IS COMPLETE TO SEE IF SHE NEEDS A B12 SHOT.

## 2017-02-11 NOTE — Telephone Encounter (Signed)
PT is aware and I left the Vm for Julia Hart that she needs the lab first and to please call and let me know that she got the message.

## 2017-02-11 NOTE — Telephone Encounter (Signed)
Santo Held is aware to take pt for Vit B12 lab. She has had a death in family and will do so when she can. She asked about pt's hemoglobin and I told her the last one I saw was from June at 8.6. She said her PCP usually gives her a shot and she thinks it is a B12 shot when hemoglobin is under 10.

## 2017-02-12 ENCOUNTER — Telehealth: Payer: Self-pay | Admitting: Gastroenterology

## 2017-02-12 NOTE — Telephone Encounter (Signed)
I spoke to [place] this Am. Noted!

## 2017-02-12 NOTE — Telephone Encounter (Signed)
REVIEWED-NO ADDITIONAL RECOMMENDATIONS. 

## 2017-02-12 NOTE — Telephone Encounter (Signed)
Julia Hart is aware and said she did receive the VM.

## 2017-02-12 NOTE — Telephone Encounter (Signed)
Julia Hart called and wanted me to let DS know the patient was going to have her B12 labs done by her PCP

## 2017-03-17 ENCOUNTER — Ambulatory Visit: Payer: Medicare Other | Admitting: Nurse Practitioner

## 2017-03-27 ENCOUNTER — Other Ambulatory Visit: Payer: Self-pay

## 2017-03-27 ENCOUNTER — Encounter: Payer: Self-pay | Admitting: Gastroenterology

## 2017-03-27 ENCOUNTER — Ambulatory Visit (INDEPENDENT_AMBULATORY_CARE_PROVIDER_SITE_OTHER): Payer: Medicare Other | Admitting: Gastroenterology

## 2017-03-27 DIAGNOSIS — R634 Abnormal weight loss: Secondary | ICD-10-CM

## 2017-03-27 DIAGNOSIS — R194 Change in bowel habit: Secondary | ICD-10-CM | POA: Diagnosis not present

## 2017-03-27 DIAGNOSIS — D638 Anemia in other chronic diseases classified elsewhere: Secondary | ICD-10-CM

## 2017-03-27 DIAGNOSIS — D649 Anemia, unspecified: Secondary | ICD-10-CM

## 2017-03-27 NOTE — Progress Notes (Signed)
Subjective:    Patient ID: Julia Hart, female    DOB: Mar 04, 1927, 83 y.o.   MRN: [id number] [name], MD  HPI Stool softener works for bowels. AVOIDING DAIRY PRODUCTS. LOSING WEIGHT. EATS BREAKFAST AND THEN NOT HUNGRY FOR REST OF THE DAY. BMs: ONCE A DAY. IF EATS CHEESE GETS UPSET STOMACH(ACHY). WEIGHT DOWN 3 LBS SINCE MAY 2018.   PT DENIES FEVER, CHILLS, HEMATOCHEZIA, nausea, vomiting, melena, diarrhea, CHEST PAIN, SHORTNESS OF BREATH,  CHANGE IN BOWEL IN HABITS, constipation, abdominal pain, problems swallowing, OR heartburn or indigestion.  Past Medical History:  Diagnosis Date  . CAD (coronary artery disease)   . HTN (hypertension)   . Hyperlipidemia     Past Surgical History:  Procedure Laterality Date  . ABDOMINAL HYSTERECTOMY    . BIOPSY  01/13/2017   Procedure: BIOPSY;  Surgeon: Danie Binder, MD;  Location: [facility];  Service: Endoscopy;;  duodenal gastric   . COLONOSCOPY N/A 01/13/2017   Procedure: COLONOSCOPY;  Surgeon: Danie Binder, MD;  Location: [facility];  Service: Endoscopy;  Laterality: N/A;  3:00pm-moved to 1:45pm per Ginger  . CORONARY ARTERY BYPASS GRAFT    . ESOPHAGOGASTRODUODENOSCOPY N/A 01/13/2017   Procedure: ESOPHAGOGASTRODUODENOSCOPY (EGD);  Surgeon: Danie Binder, MD;  Location: [facility];  Service: Endoscopy;  Laterality: N/A;    No Known Allergies  Current Outpatient Prescriptions  Medication Sig Dispense Refill  . amLODipine (NORVASC) 10 MG tablet Take 10 mg by mouth daily.    Marland Kitchen aspirin EC 81 MG tablet Take 81 mg by mouth daily.    Marland Kitchen atorvastatin (LIPITOR) 20 MG tablet Take 20 mg by mouth daily.    . Calcium Carbonate-Vitamin D (CALTRATE 600+D PO) Take 1 tablet by mouth daily at 3 pm.     . docusate sodium (COLACE) 100 MG capsule Take 100 mg BID    . epoetin alfa 40000 UNIT/ML injection Inject into the skin as needed     . ARTIFICIAL TEARS OP) Place 1-2 drops QD PRN    . ibuprofen (ADVIL,MOTRIN) 200 MG tablet Take  400 mg DAILY  pain. RARE   . latanoprost 0.005 % ophthalmic solution Place 1 drop into both eyes at bedtime.    Marland Kitchen HYZAAR 100-12.5 MG tablet Take 1 tablet by mouth daily.    Marland Kitchen LOPRESSOR 25 MG tablet Take 25 mg BID    . Multiple Vitamin (MULTIVITAMIN) capsule Take 1 capsule by mouth daily.    . potassium chloride SA  20 MEQ tablet Take 20 mEq by mouth daily.     . Ranitidine 150 MG tablet Take 150 mg BID PRN for heartburn.     . valsartan-hydrochlorothiazide (DIOVAN-HCT) 160-12.5 MG tablet Take 1 tablet by mouth daily.     Review of Systems     Objective:   Physical Exam  Constitutional: She is oriented to person, place, and time. She appears well-developed and well-nourished. No distress.  HENT:  Head: Normocephalic and atraumatic.  Mouth/Throat: Oropharynx is clear and moist. No oropharyngeal exudate.  Eyes: Pupils are equal, round, and reactive to light. No scleral icterus.  Neck: Normal range of motion. Neck supple.  Cardiovascular: Normal rate and regular rhythm.   Murmur heard. Pulmonary/Chest: Effort normal and breath sounds normal. No respiratory distress.  Abdominal: Soft. Bowel sounds are normal. She exhibits no distension. There is no tenderness.  Musculoskeletal: She exhibits no edema.  Lymphadenopathy:    She has no cervical adenopathy.  Neurological: She is alert and oriented  to person, place, and time.  NO FOCAL DEFICITS  Psychiatric: She has a normal mood and affect.  Vitals reviewed.         Assessment & Plan:

## 2017-03-27 NOTE — Patient Instructions (Addendum)
FOLLOW A LACTOSE FREE DIET.  IF YOU CONSUME DAIRY, ADD LACTASE 3 PILLS WITH MEALS UP TO THREE TIMES A DAY.  TO PREVENT WEIGHT LOSS, DRINK BOOST TWO TIMES A DAY.  CONTINUE STOOL SOFTENER.  RANITIDINE HELPS MOST WHEN USED AS NEEDED TO CONTROL HEARTBURN. YOU CAN TAKE UP TO 4 OTC RANITIDINE A DAY.  PLEASE CALL WITH QUESTIONS OR CONCERNS.  FOLLOW UP IN 6 MOS. MERRY CHRISTMAS AND HAPPY NEW YEAR!

## 2017-03-27 NOTE — Progress Notes (Signed)
ON RECALL  °

## 2017-03-27 NOTE — Assessment & Plan Note (Signed)
WEIGHT DOWN 3 LBS SINCE MAY 2018.   TO PREVENT WEIGHT LOSS, DRINK BOOST TWO TIMES A DAY. PLEASE CALL WITH QUESTIONS OR CONCERNS.  FOLLOW UP IN 6 MOS.

## 2017-03-27 NOTE — Progress Notes (Signed)
cc'ed to pcp °

## 2017-03-27 NOTE — Assessment & Plan Note (Signed)
NO BRBPR OR MELENA. HB IMPROVED TO 9.7.   CONTINUE TO MONITOR SYMPTOMS. PLEASE CALL WITH QUESTIONS OR CONCERNS.  FOLLOW UP IN 6 MOS.

## 2017-03-27 NOTE — Assessment & Plan Note (Signed)
SYMPTOMS CONTROLLED/RESOLVED AFTER CHANGING MEDS AND AVOIDING DAIRY.  FOLLOW A LACTOSE FREE DIET.  IF YOU CONSUME DAIRY, ADD LACTASE 3 PILLS WITH MEALS UP TO THREE TIMES A DAY. TO PREVENT WEIGHT LOSS, DRINK BOOST TWO TIMES A DAY. CONTINUE STOOL SOFTENER. PLEASE CALL WITH QUESTIONS OR CONCERNS.  FOLLOW UP IN 6 MOS.

## 2017-08-14 ENCOUNTER — Encounter: Payer: Self-pay | Admitting: Gastroenterology

## 2020-06-21 ENCOUNTER — Other Ambulatory Visit: Payer: Self-pay

## 2020-06-21 ENCOUNTER — Ambulatory Visit (INDEPENDENT_AMBULATORY_CARE_PROVIDER_SITE_OTHER): Payer: Medicare Other | Admitting: Nurse Practitioner

## 2020-06-21 ENCOUNTER — Telehealth: Payer: Self-pay | Admitting: *Deleted

## 2020-06-21 ENCOUNTER — Encounter: Payer: Self-pay | Admitting: Nurse Practitioner

## 2020-06-21 ENCOUNTER — Encounter: Payer: Self-pay | Admitting: *Deleted

## 2020-06-21 ENCOUNTER — Encounter: Payer: Self-pay | Admitting: Internal Medicine

## 2020-06-21 VITALS — BP 173/61 | HR 74 | Temp 97.1°F | Ht 66.0 in | Wt 122.8 lb

## 2020-06-21 DIAGNOSIS — K921 Melena: Secondary | ICD-10-CM

## 2020-06-21 DIAGNOSIS — R109 Unspecified abdominal pain: Secondary | ICD-10-CM

## 2020-06-21 DIAGNOSIS — D638 Anemia in other chronic diseases classified elsewhere: Secondary | ICD-10-CM

## 2020-06-21 NOTE — Telephone Encounter (Signed)
Called pt and LMTCB to make aware of pre-op/covid test appt 12/7 at 1:45pm.

## 2020-06-21 NOTE — H&P (View-Only) (Signed)
Primary Care Physician:  [name], MD Primary Gastroenterologist:  Dr. Abbey Chatters  Chief Complaint  Patient presents with  . Anemia    dark stool    HPI:   Julia Hart is a 86 y.o. female who presents on referral from primary care and the [facility] emergency department for anemia and dark stools.  Reviewed information provided with referral including office visit date 06/19/2020 with history of severe anemia on June 08, 2020 as well as 4.9 with diverticulosis and status post transfusion of 4 units PRBCs and 2 units of plasma emergency [place].  She returned the hospital 06/14/2020 and found to have hemoglobin above 11 with no recurrent blood noted.  She was instructed to avoid any coagulation and Plavix as well as baby aspirin as long-term benefits for protection outweighed by a short-term risk of massive GI bleed.  Recommended rechecking CBC that day, results not included.  EGD and colonoscopy completed 01/13/2017.  Findings include two 2 to 3 mm polyps in the descending colon, diverticulosis in the entire examined colon, internal hemorrhoids and no identified source for weight loss/anemia.  EGD found normal esophagus, gastritis status post biopsy, anemia most likely due to B12 deficiency and chronic renal insufficiency.  Surgical pathology of the gastric biopsies found to be mild chronic gastritis.  Today states she doing okay overall. She is accompanied by a family member.  She went to the hospital at [facility] for near syncope in mid-November and hgb as low as 4.9. She got 4 units of blood with hgb improved at discharge.  The following week she was back in the ER for abdominal pain and had hgb 11. At PCP follow-up the next week, hgb is now down to 9. In the ER her CT scan showed "pockets in her intestines that may have ruptured." Stools have been very dark, heme+ in the ER. After discharge her stools remained dark. She is on epogen shots to help with chronic anemia. Hasn't  been on iron since hospital discharge. No pepto bismol. Denies hematochezia/brbpr. Has a lot of gas. Has epigastric and esophageal burning. Denies other ongoing abdominal pain, N/V, fever, chills. Denies URI or flu-like symptoms. Denies loss of sense of taste or smell. The patient has received COVID-19 vaccination(s). Denies chest pain, dyspnea, dizziness, lightheadedness, syncope, near syncope. Denies any other upper or lower GI symptoms.  She is chronically on Eliquis, ASA 81 daily, and Plavix all due to coronary stenting in 2018. This has been held. Denies other NSAIDs. She is on Protonix daily.   Past Medical History:  Diagnosis Date  . CAD (coronary artery disease)   . HTN (hypertension)   . Hyperlipidemia     Past Surgical History:  Procedure Laterality Date  . ABDOMINAL HYSTERECTOMY    . BIOPSY  01/13/2017   Procedure: BIOPSY;  Surgeon: Danie Binder, MD;  [facility];  Service: Endoscopy;;  duodenal gastric   . COLONOSCOPY N/A 01/13/2017   Procedure: COLONOSCOPY;  Surgeon: Danie Binder, MD;  [facility];  Service: Endoscopy;  Laterality: N/A;  3:00pm-moved to 1:45pm per Ginger  . CORONARY ANGIOPLASTY WITH STENT PLACEMENT  2018  . CORONARY ARTERY BYPASS GRAFT  2008  . ESOPHAGOGASTRODUODENOSCOPY N/A 01/13/2017   Procedure: ESOPHAGOGASTRODUODENOSCOPY (EGD);  Surgeon: Danie Binder, MD;  [facility];  Service: Endoscopy;  Laterality: N/A;    No current facility-administered medications for this visit.   No current outpatient medications on file.   Facility-Administered Medications Ordered  in Other Visits  Medication Dose Route Frequency Provider Last Rate Last Admin  . lactated ringers infusion   Intravenous Continuous PRN Vista Deck, CRNA   New Bag at 06/29/20 1230  . simethicone susp in sterile water 1000 mL irrigation    PRN Daneil Dolin, MD   100 mL at 06/29/20 1229    Allergies as of 06/21/2020  . (No Known Allergies)     Family History  Problem Relation Age of Onset  . Colon cancer Neg Hx   . Colon polyps Neg Hx     Social History   Socioeconomic History  . Marital status: Single    Spouse name: Not on file  . Number of children: Not on file  . Years of education: Not on file  . Highest education level: Not on file  Occupational History  . Not on file  Tobacco Use  . Smoking status: Former Research scientist (life sciences)  . Smokeless tobacco: Never Used  Substance and Sexual Activity  . Alcohol use: No  . Drug use: No  . Sexual activity: Not on file  Other Topics Concern  . Not on file  Social History Narrative   QUIT SMOKING LONG TIME AGO. WORKED [employer]. NO KIDS. NIECE HELPS CARE FOR HER.   Social Determinants of Health   Financial Resource Strain: Not on file  Food Insecurity: Not on file  Transportation Needs: Not on file  Physical Activity: Not on file  Stress: Not on file  Social Connections: Not on file  Intimate Partner Violence: Not on file    Subjective: Review of Systems  Constitutional: Negative for chills, fever, malaise/fatigue and weight loss.  HENT: Negative for congestion and sore throat.   Respiratory: Negative for cough and shortness of breath.   Cardiovascular: Negative for chest pain and palpitations.  Gastrointestinal: Positive for abdominal pain, heartburn and melena. Negative for blood in stool, constipation, diarrhea, nausea and vomiting.  Musculoskeletal: Negative for joint pain and myalgias.  Skin: Negative for rash.  Neurological: Negative for dizziness and weakness.  Endo/Heme/Allergies: Does not bruise/bleed easily.  Psychiatric/Behavioral: Negative for depression. The patient is not nervous/anxious.   All other systems reviewed and are negative.      Objective: BP (!) 173/61   Pulse 74   Temp (!) 97.1 F (36.2 C) (Temporal)   Ht _0  (1.676 m)   Wt 122 lb 12.8 oz (55.7 kg)   BMI 19.82 kg/m  Physical Exam Vitals and nursing note reviewed.   Constitutional:      General: She is not in acute distress.    Appearance: Normal appearance. She is well-developed and normal weight. She is not ill-appearing, toxic-appearing or diaphoretic.  HENT:     Head: Normocephalic and atraumatic.     Nose: No congestion or rhinorrhea.  Eyes:     General: No scleral icterus. Cardiovascular:     Rate and Rhythm: Normal rate and regular rhythm.     Heart sounds: Murmur heard.  Systolic murmur is present with a grade of 4/6.   Pulmonary:     Effort: Pulmonary effort is normal. No respiratory distress.     Breath sounds: Normal breath sounds.  Abdominal:     General: Bowel sounds are normal.     Palpations: Abdomen is soft. There is no hepatomegaly, splenomegaly or mass.     Tenderness: There is abdominal tenderness in the epigastric area. There is no guarding or rebound.     Hernia: No hernia is present.  Skin:    General: Skin is warm and dry.     Coloration: Skin is not jaundiced.     Findings: No rash.  Neurological:     General: No focal deficit present.     Mental Status: She is alert and oriented to person, place, and time.  Psychiatric:        Attention and Perception: Attention normal.        Mood and Affect: Mood normal.        Speech: Speech normal.        Behavior: Behavior normal.        Thought Content: Thought content normal.        Cognition and Memory: Cognition and memory normal.      Assessment:  Recent hospitalization for acute on chronic anemia with hemoglobin as low as 4.9 transfused at [facility].  Does not appear to have had endoscopic evaluation, possibly no GI service availability at [facility].  Follow-up hemoglobin on 06/14/2020 at the hospital was 11.  Primary care did draw follow-up labs as well but these have not been sent to our office.  She appears to be stable at this point.  She does have a history of hemorrhoids and diverticulosis.  Acute on chronic anemia: Chronic anemia likely  multifactorial including vitamin deficiency and chronic kidney disease.  She likely did have acute GI bleed possibly diverticular in nature.  No ongoing bleeding noted.  The patient states she was told her follow-up hemoglobin is now 9.  Possibly felt possible diverticular bleed, although she states her stools were very dark/black and denies Pepto-Bismol or iron supplements.  Given the dark nature of her stools upper GI bleed is more likely.  GERD: The patient does have upper GI symptoms reminiscent of GERD.  She is currently on Protonix.  I will plan to increase this to twice a day for now.  Cannot rule out NSAID induced bleeding and recommend she avoid all NSAIDs given her ongoing GERD symptoms.  Given likely upper GI source for bleeding and worsening GERD symptoms we will plan for an endoscopy.   Proceed with EGD on propofol/MAC with Dr. Gala Romney in near future: the risks, benefits, and alternatives have been discussed with the patient in detail. The patient states understanding and desires to proceed.  The patient is currently on Eliquis, Plavix although these have been held recently given her bleed and no need to hold specifically for procedure. The patient is not on any other anticoagulants, anxiolytics, chronic pain medications, antidepressants, antidiabetics, or iron supplements.   Plan: 1. EGD as noted above 2. Increase Protonix to twice daily 3. Avoid all NSAIDs 4. Follow-up in 2 months 5. Call us for any recurrent bleeding/dark stools 6. Call us for any signs of anemia such as weakness, fatigue, dyspnea, chest pain, etc.    Thank you for allowing [place] to participate in the care of Sula Soda, DNP, AGNP-C Adult & Gerontological Nurse Practitioner [facility]   06/29/2020 12:44 PM   Disclaimer: This note was dictated with voice recognition software. Similar sounding words can inadvertently be transcribed and may not be corrected upon  review.

## 2020-06-21 NOTE — Patient Instructions (Addendum)
Your health issues we discussed today were:   Apparent active bleeding from your GI tract with dark stools: 1. While you have always had a little bit of anemia (blood loss) likely from vitamin deficiencies and chronic disease, it appears you are now having actual bleeding 2. Continue to hold your blood thinners (Eliquis, daily aspirin, Plavix) 3. Keep your appointment with cardiology on Monday 4. I am going to increase your Protonix to 40 mg twice a day.  I will send a new prescription to your pharmacy 5. Avoid all NSAIDs (ibuprofen, Motrin, Advil, Aleve, naproxen, Naprosyn, anything "NSAID" on the bottle) 6. We will schedule an upper endoscopy to evaluate for sources of bleeding 7. Further recommendations will follow 8. If you see worsening bleeding or get dizzy, worsening lightheadedness, worsening weakness, feeling like you are passing out or going to pass out and proceed to [facility] emergency room.  If they admit you then we can see you while you are in the hospital to address your bleeding.  Upper abdominal pain, likely GERD (reflux/heartburn): 1. This is likely related to the bleeding you are having 2. I am going to increase your Protonix to 40 mg twice a day 3. Take this pursing in the morning and then again 30 minutes before your last meal the day 4. Avoid all NSAIDs as I discussed above 5. Call us for any worsening or severe symptoms  Overall I recommend:  1. Continue your other current medications 2. Return for follow-up in 2 months 3. Call us for any questions or concerns   ---------------------------------------------------------------  I am glad you have gotten your COVID-19 vaccination!  Even though you are fully vaccinated you should continue to follow CDC and state/local guidelines.  ---------------------------------------------------------------   At [facility] we value your feedback. You may receive a survey about your visit today. Please share  your experience as we strive to create trusting relationships with our patients to provide genuine, compassionate, quality care.  We appreciate your understanding and patience as we review any laboratory studies, imaging, and other diagnostic tests that are ordered as we care for you. Our office policy is 5 business days for review of these results, and any emergent or urgent results are addressed in a timely manner for your best interest. If you do not hear from our office in 1 week, please contact us.   We also encourage the use of MyChart, which contains your medical information for your review as well. If you are not enrolled in this feature, an access code is on this after visit summary for your convenience. Thank you for allowing [place] to be involved in your care.  It was great to see you today!  I hope you have a Merry Christmas and Happy New Year!!

## 2020-06-21 NOTE — Progress Notes (Signed)
  Primary Care Physician:  Caplan, Michael, MD Primary Gastroenterologist:  Dr. Carver  Chief Complaint  Patient presents with  . Anemia    dark stool    HPI:   Julia Hart is a 86 y.o. female who presents on referral from primary care and the [facility] emergency department for anemia and dark stools.  Reviewed information provided with referral including office visit date 06/19/2020 with history of severe anemia on June 08, 2020 as well as 4.9 with diverticulosis and status post transfusion of 4 units PRBCs and 2 units of plasma emergency [place].  She returned the hospital 06/14/2020 and found to have hemoglobin above 11 with no recurrent blood noted.  She was instructed to avoid any coagulation and Plavix as well as baby aspirin as long-term benefits for protection outweighed by a short-term risk of massive GI bleed.  Recommended rechecking CBC that day, results not included.  EGD and colonoscopy completed 01/13/2017.  Findings include two 2 to 3 mm polyps in the descending colon, diverticulosis in the entire examined colon, internal hemorrhoids and no identified source for weight loss/anemia.  EGD found normal esophagus, gastritis status post biopsy, anemia most likely due to B12 deficiency and chronic renal insufficiency.  Surgical pathology of the gastric biopsies found to be mild chronic gastritis.  Today states she doing okay overall. She is accompanied by a family member.  She went to the hospital at [place] for near syncope in mid-November and hgb as low as 4.9. She got 4 units of blood with hgb improved at discharge.  The following week she was back in the ER for abdominal pain and had hgb 11. At PCP follow-up the next week, hgb is now down to 9. In the ER her CT scan showed "pockets in her intestines that may have ruptured." Stools have been very dark, heme+ in the ER. After discharge her stools remained dark. She is on epogen shots to help with chronic anemia. Hasn't  been on iron since hospital discharge. No pepto bismol. Denies hematochezia/brbpr. Has a lot of gas. Has epigastric and esophageal burning. Denies other ongoing abdominal pain, N/V, fever, chills. Denies URI or flu-like symptoms. Denies loss of sense of taste or smell. The patient has received COVID-19 vaccination(s). Denies chest pain, dyspnea, dizziness, lightheadedness, syncope, near syncope. Denies any other upper or lower GI symptoms.  She is chronically on Eliquis, ASA 81 daily, and Plavix all due to coronary stenting in 2018. This has been held. Denies other NSAIDs. She is on Protonix daily.   Past Medical History:  Diagnosis Date  . CAD (coronary artery disease)   . HTN (hypertension)   . Hyperlipidemia     Past Surgical History:  Procedure Laterality Date  . ABDOMINAL HYSTERECTOMY    . BIOPSY  01/13/2017   Procedure: BIOPSY;  Surgeon: Fields, Sandi L, MD;  [facility];  Service: Endoscopy;;  duodenal gastric   . COLONOSCOPY N/A 01/13/2017   Procedure: COLONOSCOPY;  Surgeon: Fields, Sandi L, MD;  [facility];  Service: Endoscopy;  Laterality: N/A;  3:00pm-moved to 1:45pm per Ginger  . CORONARY ANGIOPLASTY WITH STENT PLACEMENT  2018  . CORONARY ARTERY BYPASS GRAFT  2008  . ESOPHAGOGASTRODUODENOSCOPY N/A 01/13/2017   Procedure: ESOPHAGOGASTRODUODENOSCOPY (EGD);  Surgeon: Fields, Sandi L, MD;  [facility];  Service: Endoscopy;  Laterality: N/A;    No current facility-administered medications for this visit.   No current outpatient medications on file.   Facility-Administered Medications Ordered   in Other Visits  Medication Dose Route Frequency Provider Last Rate Last Admin  . lactated ringers infusion   Intravenous Continuous PRN Yates, Teresa S, CRNA   New Bag at 06/29/20 1230  . simethicone susp in sterile water 1000 mL irrigation    PRN Rourk, Robert M, MD   100 mL at 06/29/20 1229    Allergies as of 06/21/2020  . (No Known Allergies)     Family History  Problem Relation Age of Onset  . Colon cancer Neg Hx   . Colon polyps Neg Hx     Social History   Socioeconomic History  . Marital status: Single    Spouse name: Not on file  . Number of children: Not on file  . Years of education: Not on file  . Highest education level: Not on file  Occupational History  . Not on file  Tobacco Use  . Smoking status: Former Smoker  . Smokeless tobacco: Never Used  Substance and Sexual Activity  . Alcohol use: No  . Drug use: No  . Sexual activity: Not on file  Other Topics Concern  . Not on file  Social History Narrative   QUIT SMOKING LONG TIME AGO. WORKED FOR [employer]. NO KIDS. NIECE HELPS CARE FOR HER.   Social Determinants of Health   Financial Resource Strain: Not on file  Food Insecurity: Not on file  Transportation Needs: Not on file  Physical Activity: Not on file  Stress: Not on file  Social Connections: Not on file  Intimate Partner Violence: Not on file    Subjective: Review of Systems  Constitutional: Negative for chills, fever, malaise/fatigue and weight loss.  HENT: Negative for congestion and sore throat.   Respiratory: Negative for cough and shortness of breath.   Cardiovascular: Negative for chest pain and palpitations.  Gastrointestinal: Positive for abdominal pain, heartburn and melena. Negative for blood in stool, constipation, diarrhea, nausea and vomiting.  Musculoskeletal: Negative for joint pain and myalgias.  Skin: Negative for rash.  Neurological: Negative for dizziness and weakness.  Endo/Heme/Allergies: Does not bruise/bleed easily.  Psychiatric/Behavioral: Negative for depression. The patient is not nervous/anxious.   All other systems reviewed and are negative.      Objective: BP (!) 173/61   Pulse 74   Temp (!) 97.1 F (36.2 C) (Temporal)   Ht 5' 6" (1.676 m)   Wt 122 lb 12.8 oz (55.7 kg)   BMI 19.82 kg/m  Physical Exam Vitals and nursing note reviewed.   Constitutional:      General: She is not in acute distress.    Appearance: Normal appearance. She is well-developed and normal weight. She is not ill-appearing, toxic-appearing or diaphoretic.  HENT:     Head: Normocephalic and atraumatic.     Nose: No congestion or rhinorrhea.  Eyes:     General: No scleral icterus. Cardiovascular:     Rate and Rhythm: Normal rate and regular rhythm.     Heart sounds: Murmur heard.  Systolic murmur is present with a grade of 4/6.   Pulmonary:     Effort: Pulmonary effort is normal. No respiratory distress.     Breath sounds: Normal breath sounds.  Abdominal:     General: Bowel sounds are normal.     Palpations: Abdomen is soft. There is no hepatomegaly, splenomegaly or mass.     Tenderness: There is abdominal tenderness in the epigastric area. There is no guarding or rebound.     Hernia: No hernia is present.    Skin:    General: Skin is warm and dry.     Coloration: Skin is not jaundiced.     Findings: No rash.  Neurological:     General: No focal deficit present.     Mental Status: She is alert and oriented to person, place, and time.  Psychiatric:        Attention and Perception: Attention normal.        Mood and Affect: Mood normal.        Speech: Speech normal.        Behavior: Behavior normal.        Thought Content: Thought content normal.        Cognition and Memory: Cognition and memory normal.      Assessment:  Recent hospitalization for acute on chronic anemia with hemoglobin as low as 4.9 transfused at [facility].  Does not appear to have had endoscopic evaluation, possibly no GI service availability at [facility].  Follow-up hemoglobin on 06/14/2020 at the hospital was 11.  Primary care did draw follow-up labs as well but these have not been sent to our office.  She appears to be stable at this point.  She does have a history of hemorrhoids and diverticulosis.  Acute on chronic anemia: Chronic anemia likely  multifactorial including vitamin deficiency and chronic kidney disease.  She likely did have acute GI bleed possibly diverticular in nature.  No ongoing bleeding noted.  The patient states she was told her follow-up hemoglobin is now 9.  Possibly felt possible diverticular bleed, although she states her stools were very dark/black and denies Pepto-Bismol or iron supplements.  Given the dark nature of her stools upper GI bleed is more likely.  GERD: The patient does have upper GI symptoms reminiscent of GERD.  She is currently on Protonix.  I will plan to increase this to twice a day for now.  Cannot rule out NSAID induced bleeding and recommend she avoid all NSAIDs given her ongoing GERD symptoms.  Given likely upper GI source for bleeding and worsening GERD symptoms we will plan for an endoscopy.   Proceed with EGD on propofol/MAC with Dr. Rourk in near future: the risks, benefits, and alternatives have been discussed with the patient in detail. The patient states understanding and desires to proceed.  The patient is currently on Eliquis, Plavix although these have been held recently given her bleed and no need to hold specifically for procedure. The patient is not on any other anticoagulants, anxiolytics, chronic pain medications, antidepressants, antidiabetics, or iron supplements.   Plan: 1. EGD as noted above 2. Increase Protonix to twice daily 3. Avoid all NSAIDs 4. Follow-up in 2 months 5. Call us for any recurrent bleeding/dark stools 6. Call us for any signs of anemia such as weakness, fatigue, dyspnea, chest pain, etc.    Thank you for allowing us to participate in the care of Nashali Strathman  Ayaana Biondo, DNP, AGNP-C Adult & Gerontological Nurse Practitioner [facility]   06/29/2020 12:44 PM   Disclaimer: This note was dictated with voice recognition software. Similar sounding words can inadvertently be transcribed and may not be corrected upon  review.  

## 2020-06-22 NOTE — Patient Instructions (Signed)
Monserrate Blaschke  06/22/2020     @PREFPERIOPPHARMACY @   Your procedure is scheduled on  06/29/2020.  Report to [facility] at  1115  A.M.  Call this number if you have problems the morning of surgery:  [phone]   Remember:  Follow the diet and prep instructions given to you by the office.                      Take these medicines the morning of surgery with A SIP OF WATER  Amlodipine, isosorbide, remeron, protonix.    Do not wear jewelry, make-up or nail polish.  Do not wear lotions, powders, or perfumes. Please wear deodorant and brush your teeth.  Do not shave 48 hours prior to surgery.  Men may shave face and neck.  Do not bring valuables to the hospital.  [facility] is not responsible for any belongings or valuables.  Contacts, dentures or bridgework may not be worn into surgery.  Leave your suitcase in the car.  After surgery it may be brought to your room.  For patients admitted to the hospital, discharge time will be determined by your treatment team.  Patients discharged the day of surgery will not be allowed to drive home.   Name and phone number of your driver:   family Special instructions:  DO NOT smoke the morning of your procedure.  Please read over the following fact sheets that you were given. Anesthesia Post-op Instructions and Care and Recovery After Surgery       Upper Endoscopy, Adult, Care After This sheet gives you information about how to care for yourself after your procedure. Your health care provider may also give you more specific instructions. If you have problems or questions, contact your health care provider. What can I expect after the procedure? After the procedure, it is common to have:  A sore throat.  Mild stomach pain or discomfort.  Bloating.  Nausea. Follow these instructions at home:   Follow instructions from your health care provider about what to eat or drink after your procedure.  Return to your normal  activities as told by your health care provider. Ask your health care provider what activities are safe for you.  Take over-the-counter and prescription medicines only as told by your health care provider.  Do not drive for 24 hours if you were given a sedative during your procedure.  Keep all follow-up visits as told by your health care provider. This is important. Contact a health care provider if you have:  A sore throat that lasts longer than one day.  Trouble swallowing. Get help right away if:  You vomit blood or your vomit looks like coffee grounds.  You have: ? A fever. ? Bloody, black, or tarry stools. ? A severe sore throat or you cannot swallow. ? Difficulty breathing. ? Severe pain in your chest or abdomen. Summary  After the procedure, it is common to have a sore throat, mild stomach discomfort, bloating, and nausea.  Do not drive for 24 hours if you were given a sedative during the procedure.  Follow instructions from your health care provider about what to eat or drink after your procedure.  Return to your normal activities as told by your health care provider. This information is not intended to replace advice given to you by your health care provider. Make sure you discuss any questions you have with your health care provider. Document Revised: 12/30/2017  Document Reviewed: 12/08/2017 Elsevier Patient Education  [place] After These instructions provide you with information about caring for yourself after your procedure. Your health care provider may also give you more specific instructions. Your treatment has been planned according to current medical practices, but problems sometimes occur. Call your health care provider if you have any problems or questions after your procedure. What can I expect after the procedure? After your procedure, you may:  Feel sleepy for several hours.  Feel clumsy and have poor balance  for several hours.  Feel forgetful about what happened after the procedure.  Have poor judgment for several hours.  Feel nauseous or vomit.  Have a sore throat if you had a breathing tube during the procedure. Follow these instructions at home: For at least 24 hours after the procedure:      Have a responsible adult stay with you. It is important to have someone help care for you until you are awake and alert.  Rest as needed.  Do not: ? Participate in activities in which you could fall or become injured. ? Drive. ? Use heavy machinery. ? Drink alcohol. ? Take sleeping pills or medicines that cause drowsiness. ? Make important decisions or sign legal documents. ? Take care of children on your own. Eating and drinking  Follow the diet that is recommended by your health care provider.  If you vomit, drink water, juice, or soup when you can drink without vomiting.  Make sure you have little or no nausea before eating solid foods. General instructions  Take over-the-counter and prescription medicines only as told by your health care provider.  If you have sleep apnea, surgery and certain medicines can increase your risk for breathing problems. Follow instructions from your health care provider about wearing your sleep device: ? Anytime you are sleeping, including during daytime naps. ? While taking prescription pain medicines, sleeping medicines, or medicines that make you drowsy.  If you smoke, do not smoke without supervision.  Keep all follow-up visits as told by your health care provider. This is important. Contact a health care provider if:  You keep feeling nauseous or you keep vomiting.  You feel light-headed.  You develop a rash.  You have a fever. Get help right away if:  You have trouble breathing. Summary  For several hours after your procedure, you may feel sleepy and have poor judgment.  Have a responsible adult stay with you for at least 24 hours  or until you are awake and alert. This information is not intended to replace advice given to you by your health care provider. Make sure you discuss any questions you have with your health care provider. Document Revised: 10/06/2017 Document Reviewed: 10/29/2015 Elsevier Patient Education  [place].

## 2020-06-22 NOTE — Telephone Encounter (Signed)
Patient called back and spoke with Manuela Schwartz regarding appt

## 2020-06-27 ENCOUNTER — Other Ambulatory Visit (HOSPITAL_COMMUNITY)
Admission: RE | Admit: 2020-06-27 | Discharge: 2020-06-27 | Disposition: A | Discharge status: Home / Self Care | Payer: Medicare Other | Source: Ambulatory Visit | Attending: Internal Medicine | Admitting: Internal Medicine

## 2020-06-27 ENCOUNTER — Encounter (HOSPITAL_COMMUNITY)
Admission: RE | Admit: 2020-06-27 | Discharge: 2020-06-27 | Disposition: A | Discharge status: Home / Self Care | Payer: Medicare Other | Source: Ambulatory Visit | Attending: Internal Medicine | Admitting: Internal Medicine

## 2020-06-27 ENCOUNTER — Encounter (HOSPITAL_COMMUNITY): Payer: Self-pay

## 2020-06-27 ENCOUNTER — Other Ambulatory Visit: Payer: Self-pay

## 2020-06-27 DIAGNOSIS — Z01812 Encounter for preprocedural laboratory examination: Secondary | ICD-10-CM | POA: Diagnosis not present

## 2020-06-27 DIAGNOSIS — Z20822 Contact with and (suspected) exposure to covid-19: Secondary | ICD-10-CM | POA: Diagnosis not present

## 2020-06-27 LAB — CBC WITH DIFFERENTIAL/PLATELET
Abs Immature Granulocytes: 0.01 10*3/uL (ref 0.00–0.07)
Basophils Absolute: 0 10*3/uL (ref 0.0–0.1)
Basophils Relative: 1 %
Eosinophils Absolute: 0.4 10*3/uL (ref 0.0–0.5)
Eosinophils Relative: 8 %
HCT: 29.1 % — ABNORMAL LOW (ref 36.0–46.0)
Hemoglobin: 9 g/dL — ABNORMAL LOW (ref 12.0–15.0)
Immature Granulocytes: 0 %
Lymphocytes Relative: 23 %
Lymphs Abs: 1.1 10*3/uL (ref 0.7–4.0)
MCH: 29.1 pg (ref 26.0–34.0)
MCHC: 30.9 g/dL (ref 30.0–36.0)
MCV: 94.2 fL (ref 80.0–100.0)
Monocytes Absolute: 0.5 10*3/uL (ref 0.1–1.0)
Monocytes Relative: 10 %
Neutro Abs: 2.7 10*3/uL (ref 1.7–7.7)
Neutrophils Relative %: 58 %
Platelets: 204 10*3/uL (ref 150–400)
RBC: 3.09 MIL/uL — ABNORMAL LOW (ref 3.87–5.11)
RDW: 15 % (ref 11.5–15.5)
WBC: 4.7 10*3/uL (ref 4.0–10.5)
nRBC: 0 % (ref 0.0–0.2)

## 2020-06-27 LAB — BASIC METABOLIC PANEL
Anion gap: 8 (ref 5–15)
BUN: 29 mg/dL — ABNORMAL HIGH (ref 8–23)
CO2: 26 mmol/L (ref 22–32)
Calcium: 8.4 mg/dL — ABNORMAL LOW (ref 8.9–10.3)
Chloride: 105 mmol/L (ref 98–111)
Creatinine, Ser: 1.21 mg/dL — ABNORMAL HIGH (ref 0.44–1.00)
GFR, Estimated: 42 mL/min — ABNORMAL LOW (ref >60)
Glucose, Bld: 102 mg/dL — ABNORMAL HIGH (ref 70–99)
Potassium: 3.8 mmol/L (ref 3.5–5.1)
Sodium: 139 mmol/L (ref 135–145)

## 2020-06-27 LAB — SARS CORONAVIRUS 2 (TAT 6-24 HRS): SARS Coronavirus 2: NEGATIVE

## 2020-06-29 ENCOUNTER — Encounter (HOSPITAL_COMMUNITY)
Admission: RE | Disposition: A | Discharge status: Home / Self Care | Payer: Self-pay | Source: Home / Self Care | Attending: Internal Medicine

## 2020-06-29 ENCOUNTER — Ambulatory Visit (HOSPITAL_COMMUNITY)
Admission: RE | Admit: 2020-06-29 | Discharge: 2020-06-29 | Disposition: A | Discharge status: Home / Self Care | Payer: Medicare Other | Attending: Internal Medicine | Admitting: Internal Medicine

## 2020-06-29 ENCOUNTER — Ambulatory Visit (HOSPITAL_COMMUNITY): Payer: Medicare Other | Admitting: Anesthesiology

## 2020-06-29 ENCOUNTER — Encounter: Payer: Self-pay | Admitting: Nurse Practitioner

## 2020-06-29 DIAGNOSIS — D649 Anemia, unspecified: Secondary | ICD-10-CM | POA: Diagnosis not present

## 2020-06-29 DIAGNOSIS — Z79899 Other long term (current) drug therapy: Secondary | ICD-10-CM | POA: Insufficient documentation

## 2020-06-29 DIAGNOSIS — K449 Diaphragmatic hernia without obstruction or gangrene: Secondary | ICD-10-CM | POA: Insufficient documentation

## 2020-06-29 DIAGNOSIS — K921 Melena: Secondary | ICD-10-CM

## 2020-06-29 DIAGNOSIS — Z87891 Personal history of nicotine dependence: Secondary | ICD-10-CM | POA: Diagnosis not present

## 2020-06-29 DIAGNOSIS — K219 Gastro-esophageal reflux disease without esophagitis: Secondary | ICD-10-CM | POA: Diagnosis not present

## 2020-06-29 DIAGNOSIS — R109 Unspecified abdominal pain: Secondary | ICD-10-CM | POA: Insufficient documentation

## 2020-06-29 HISTORY — PX: ESOPHAGOGASTRODUODENOSCOPY (EGD) WITH PROPOFOL: SHX5813

## 2020-06-29 SURGERY — ESOPHAGOGASTRODUODENOSCOPY (EGD) WITH PROPOFOL
Anesthesia: General

## 2020-06-29 MED ORDER — LIDOCAINE HCL (CARDIAC) PF 50 MG/5ML IV SOSY
PREFILLED_SYRINGE | INTRAVENOUS | Status: DC | PRN
  Administered 2020-06-29: 50 mg via INTRAVENOUS

## 2020-06-29 MED ORDER — STERILE WATER FOR IRRIGATION IR SOLN
Status: DC | PRN
  Administered 2020-06-29: 100 mL

## 2020-06-29 MED ORDER — LIDOCAINE VISCOUS HCL 2 % MT SOLN
OROMUCOSAL | Status: AC
  Filled 2020-06-29: qty 15

## 2020-06-29 MED ORDER — PROPOFOL 10 MG/ML IV BOLUS
INTRAVENOUS | Status: DC | PRN
  Administered 2020-06-29: 70 mg via INTRAVENOUS

## 2020-06-29 MED ORDER — LACTATED RINGERS IV SOLN: Freq: Once | INTRAVENOUS | Status: AC

## 2020-06-29 MED ORDER — LACTATED RINGERS IV SOLN: INTRAVENOUS | Status: DC | PRN

## 2020-06-29 MED ORDER — LIDOCAINE VISCOUS HCL 2 % MT SOLN
15.0000 mL | Freq: Once | OROMUCOSAL | Status: AC
  Administered 2020-06-29: 15 mL via OROMUCOSAL

## 2020-06-29 NOTE — Anesthesia Preprocedure Evaluation (Addendum)
Anesthesia Evaluation  Patient identified by MRN, date of birth, ID band Patient awake    Reviewed: Allergy & Precautions, NPO status , Patient's Chart, lab work & pertinent test results  Airway Mallampati: I  TM Distance: >3 FB Neck ROM: Full    Dental  (+) Edentulous Upper, Edentulous Lower   Pulmonary shortness of breath and with exertion, former smoker,    Pulmonary exam normal breath sounds clear to auscultation       Cardiovascular hypertension, Pt. on medications + CAD, + Cardiac Stents (2018), + CABG, +CHF (EF - 35 to 40%) and + DOE  + dysrhythmias Atrial Fibrillation  Rhythm:Regular Rate:Normal + Systolic murmurs    Neuro/Psych negative psych ROS   GI/Hepatic negative GI ROS, Neg liver ROS,   Endo/Other  negative endocrine ROS  Renal/GU Renal InsufficiencyRenal disease     Musculoskeletal negative musculoskeletal ROS (+)   Abdominal   Peds  Hematology  (+) anemia ,   Anesthesia Other Findings   Reproductive/Obstetrics negative OB ROS                           Anesthesia Physical Anesthesia Plan  ASA: IV  Anesthesia Plan: General   Post-op Pain Management:    Induction: Intravenous  PONV Risk Score and Plan: TIVA  Airway Management Planned: Nasal Cannula and Natural Airway  Additional Equipment:   Intra-op Plan:   Post-operative Plan:   Informed Consent: I have reviewed the patients History and Physical, chart, labs and discussed the procedure including the risks, benefits and alternatives for the proposed anesthesia with the patient or authorized representative who has indicated his/her understanding and acceptance.       Plan Discussed with: CRNA and Surgeon  Anesthesia Plan Comments:        Anesthesia Quick Evaluation

## 2020-06-29 NOTE — Interval H&P Note (Signed)
History and Physical Interval Note:  06/29/2020 12:46 PM  Julia Hart  has presented today for surgery, with the diagnosis of abd pain, melena, acute on chronic anemia.  The various methods of treatment have been discussed with the patient and family. After consideration of risks, benefits and other options for treatment, the patient has consented to  Procedure(s) with comments: ESOPHAGOGASTRODUODENOSCOPY (EGD) WITH PROPOFOL (N/A) - 1:45pm as a surgical intervention.  The patient's history has been reviewed, patient examined, no change in status, stable for surgery.  I have reviewed the patient's chart and labs.  Questions were answered to the patient's satisfaction.     Gweneth Fredlund  No change.  No dysphagia.  EGD per plan today.  The risks, benefits, limitations, alternatives and imponderables have been reviewed with the patient. Potential for esophageal dilation, biopsy, etc. have also been reviewed.  Questions have been answered. All parties agreeable.

## 2020-06-29 NOTE — Anesthesia Postprocedure Evaluation (Signed)
Anesthesia Post Note  Patient: Kasha Howeth  Procedure(s) Performed: ESOPHAGOGASTRODUODENOSCOPY (EGD) WITH PROPOFOL (N/A )  Patient location during evaluation: PACU Anesthesia Type: General Level of consciousness: awake and alert and patient cooperative Pain management: satisfactory to patient Vital Signs Assessment: post-procedure vital signs reviewed and stable Respiratory status: spontaneous breathing Cardiovascular status: stable Postop Assessment: no apparent nausea or vomiting Anesthetic complications: no   No complications documented.   Last Vitals:  Vitals:   06/29/20 1302 06/29/20 1315  BP: (!) 133/47 (!) 136/43  Pulse: 73 61  Resp: (!) 22 18  Temp: 36.8 C   SpO2: 100% 98%    Last Pain:  Vitals:   06/29/20 1324  TempSrc:   PainSc: 0-No pain                 Chelcee Korpi

## 2020-06-29 NOTE — Transfer of Care (Signed)
Immediate Anesthesia Transfer of Care Note  Patient: Julia Hart  Procedure(s) Performed: ESOPHAGOGASTRODUODENOSCOPY (EGD) WITH PROPOFOL (N/A )  Patient Location: PACU  Anesthesia Type:General  Level of Consciousness: sedated and patient cooperative  Airway & Oxygen Therapy: Patient Spontanous Breathing and Patient connected to nasal cannula oxygen  Post-op Assessment: Report given to RN and Post -op Vital signs reviewed and stable  Post vital signs: Reviewed and stable  Last Vitals:  Vitals Value Taken Time  BP    Temp 98.2   Pulse 73 06/29/20 1301  Resp 22 06/29/20 1301  SpO2 100 % 06/29/20 1301  Vitals shown include unvalidated device data.  Last Pain:  Vitals:   06/29/20 1248  TempSrc:   PainSc: 0-No pain      Patients Stated Pain Goal: 8 (09/92/78 0044)  Complications: No complications documented.

## 2020-06-29 NOTE — Anesthesia Procedure Notes (Signed)
Date/Time: 06/29/2020 12:48 PM Performed by: Vista Deck, CRNA Pre-anesthesia Checklist: Patient identified, Emergency Drugs available, Suction available, Timeout performed and Patient being monitored Patient Re-evaluated:Patient Re-evaluated prior to induction Oxygen Delivery Method: Nasal Cannula

## 2020-06-29 NOTE — Op Note (Signed)
[facility] Patient Name: Julia Hart Procedure Date: 06/29/2020 12:26 PM MRN: [id number] Date of Birth: 12/30/1926 Attending MD: Norvel Richards , MD CSN: [id number] Age: 86 Admit Type: Outpatient Procedure:                Upper GI endoscopy Indications:              Melena Providers:                Norvel Richards, MD, Caprice Kluver, Rosina Lowenstein,                            RN, Aram Candela Referring MD:              Medicines:                Propofol per Anesthesia Complications:            No immediate complications. Estimated Blood Loss:     Estimated blood loss: none. Procedure:                Pre-Anesthesia Assessment:                           - Prior to the procedure, a History and Physical                            was performed, and patient medications and                            allergies were reviewed. The patient's tolerance of                            previous anesthesia was also reviewed. The risks                            and benefits of the procedure and the sedation                            options and risks were discussed with the patient.                            All questions were answered, and informed consent                            was obtained. Prior Anticoagulants: The patient has                            taken no previous anticoagulant or antiplatelet                            agents. ASA Grade Assessment: III - A patient with                            severe systemic disease. After reviewing the risks  and benefits, the patient was deemed in                            satisfactory condition to undergo the procedure.                           After obtaining informed consent, the endoscope was                            passed under direct vision. Throughout the                            procedure, the patient's blood pressure, pulse, and                            oxygen saturations were  monitored continuously. The                            GIF-H190 ([id number]) scope was introduced through the                            mouth, and advanced to the second part of duodenum.                            The upper GI endoscopy was accomplished without                            difficulty. The patient tolerated the procedure                            well. Scope In: 12:53:12 PM Scope Out: 37:90:24 PM Total Procedure Duration: 0 hours 3 minutes 0 seconds  Findings:      The examined esophagus was normal.      A small hiatal hernia was present.      The exam was otherwise without abnormality. Impression:               - Normal esophagus.                           - Small hiatal hernia.                           - The examination was otherwise normal.                           - No specimens collected. In the setting of Plavix                            and Eliquis administration, bleeding could have                            bled from anywhere along the GI tract. Eliquis and  Plavix have been previously discontinued.                            Hemoglobin 9.0 on 12/7. Moderate Sedation:      Moderate (conscious) sedation was personally administered by an       anesthesia professional. The following parameters were monitored: oxygen       saturation, heart rate, blood pressure, respiratory rate, EKG, adequacy       of pulmonary ventilation, and response to care. Recommendation:           - Patient has a contact number available for                            emergencies. The signs and symptoms of potential                            delayed complications were discussed with the                            patient. Return to normal activities tomorrow.                            Written discharge instructions were provided to the                            patient.                           - Advance diet as tolerated. Office visit with [place]                             in 3 weeks with repeat CBC. At that time, will                            decide if further GI evaluation is warranted.                            Protonix can be decreased to 40 mg once daily. Procedure Code(s):        --- Professional ---                           [phone], Esophagogastroduodenoscopy, flexible,                            transoral; diagnostic, including collection of                            specimen(s) by brushing or washing, when performed                            (separate procedure) Diagnosis Code(s):        --- Professional ---                           K44.9, Diaphragmatic hernia without obstruction or  gangrene                           K92.1, Melena (includes Hematochezia) CPT copyright 2019 American Medical Association. All rights reserved. The codes documented in this report are preliminary and upon coder review may  be revised to meet current compliance requirements. [name]. [name], MD Norvel Richards, MD 06/29/2020 1:05:24 PM This report has been signed electronically. Number of Addenda: 0

## 2020-06-29 NOTE — Discharge Instructions (Signed)
EGD Discharge instructions Please read the instructions outlined below and refer to this sheet in the next few weeks. These discharge instructions provide you with general information on caring for yourself after you leave the hospital. Your doctor may also give you specific instructions. While your treatment has been planned according to the most current medical practices available, unavoidable complications occasionally occur. If you have any problems or questions after discharge, please call your doctor. ACTIVITY  You may resume your regular activity but move at a slower pace for the next 24 hours.   Take frequent rest periods for the next 24 hours.   Walking will help expel (get rid of) the air and reduce the bloated feeling in your abdomen.   No driving for 24 hours (because of the anesthesia (medicine) used during the test).   You may shower.   Do not sign any important legal documents or operate any machinery for 24 hours (because of the anesthesia used during the test).  NUTRITION  Drink plenty of fluids.   You may resume your normal diet.   Begin with a light meal and progress to your normal diet.   Avoid alcoholic beverages for 24 hours or as instructed by your caregiver.  MEDICATIONS  You may resume your normal medications unless your caregiver tells you otherwise.  WHAT YOU CAN EXPECT TODAY  You may experience abdominal discomfort such as a feeling of fullness or "gas" pains.  FOLLOW-UP  Your doctor will discuss the results of your test with you.  SEEK IMMEDIATE MEDICAL ATTENTION IF ANY OF THE FOLLOWING OCCUR:  Excessive nausea (feeling sick to your stomach) and/or vomiting.   Severe abdominal pain and distention (swelling).   Trouble swallowing.   Temperature over 101 F (37.8 C).   Rectal bleeding or vomiting of blood.   Upper GI tract looked good today.  Only a small hiatal hernia which is not causing any problems.  May decrease Protonix to 40 mg once  daily  CBC and office visit with [place]) in 3 weeks to decide if further GI evaluation is needed.  At patient request, I called Judson Roch at [phone]  -got voicemail.  Left a message.   Monitored Anesthesia Care, Care After These instructions provide you with information about caring for yourself after your procedure. Your health care provider may also give you more specific instructions. Your treatment has been planned according to current medical practices, but problems sometimes occur. Call your health care provider if you have any problems or questions after your procedure. What can I expect after the procedure? After your procedure, you may:  Feel sleepy for several hours.  Feel clumsy and have poor balance for several hours.  Feel forgetful about what happened after the procedure.  Have poor judgment for several hours.  Feel nauseous or vomit.  Have a sore throat if you had a breathing tube during the procedure. Follow these instructions at home: For at least 24 hours after the procedure:      Have a responsible adult stay with you. It is important to have someone help care for you until you are awake and alert.  Rest as needed.  Do not: ? Participate in activities in which you could fall or become injured. ? Drive. ? Use heavy machinery. ? Drink alcohol. ? Take sleeping pills or medicines that cause drowsiness. ? Make important decisions or sign legal documents. ? Take care of children on your own. Eating and drinking  Follow the  diet that is recommended by your health care provider.  If you vomit, drink water, juice, or soup when you can drink without vomiting.  Make sure you have little or no nausea before eating solid foods. General instructions  Take over-the-counter and prescription medicines only as told by your health care provider.  If you have sleep apnea, surgery and certain medicines can increase your risk for breathing problems.  Follow instructions from your health care provider about wearing your sleep device: ? Anytime you are sleeping, including during daytime naps. ? While taking prescription pain medicines, sleeping medicines, or medicines that make you drowsy.  If you smoke, do not smoke without supervision.  Keep all follow-up visits as told by your health care provider. This is important. Contact a health care provider if:  You keep feeling nauseous or you keep vomiting.  You feel light-headed.  You develop a rash.  You have a fever. Get help right away if:  You have trouble breathing. Summary  For several hours after your procedure, you may feel sleepy and have poor judgment.  Have a responsible adult stay with you for at least 24 hours or until you are awake and alert. This information is not intended to replace advice given to you by your health care provider. Make sure you discuss any questions you have with your health care provider. Document Revised: 10/06/2017 Document Reviewed: 10/29/2015 Elsevier Patient Education  [place].

## 2020-06-30 NOTE — Progress Notes (Signed)
Cc'ed to pcp °

## 2020-07-03 ENCOUNTER — Telehealth: Payer: Self-pay

## 2020-07-03 NOTE — Telephone Encounter (Signed)
Communication noted.  We can fall back and have pt follow all of Dr. Sanjuan Dame recommendations

## 2020-07-03 NOTE — Telephone Encounter (Signed)
Noted. Spoke with Julia Hart. She is aware we will continue to hold off on the Plavix/Eliquis, draw labs in 2 weeks and get input from PCP/cardiologist on restarting meds. Called pts PCP office [name], MD) and left a message for the nurse to call me back. Waiting on a return call.  Also left a message for pts cardiologist office to return call Dr. Orpah Greek.

## 2020-07-03 NOTE — Telephone Encounter (Signed)
Received a call from [place]. She would like to know should pt resume the Eliquis and Plavix that she was taking prior to her procedure on 06/29/20.

## 2020-07-03 NOTE — Telephone Encounter (Signed)
FYI Spoke with Dr. Ammie Ferrier nurse. Pt was asked to d/c Asprin indefinitely at her apt on 06/26/20. Pt was then asked to come back in three weeks to discuss decreasing the Eliquis to 2.5 mg. Dr. Sabra Heck plans to see pt back to see if pt needs to reduce dosages of anticoagulant medications.   Mailing lab orders to pt.

## 2020-07-03 NOTE — Telephone Encounter (Signed)
That is a very good question.  Patient still anemic.  No overt bleeding.  I recommend she resume a baby aspirin 81 mg daily and continue Protonix 40 mg twice daily.  Hold up on resuming Eliquis and Plavix for now.  Should have a CBC in about 2 weeks and an office visit.  May need a capsule study.  May need input from PCP and cardiology regarding continuing Plavix and aspirin.

## 2020-07-04 ENCOUNTER — Other Ambulatory Visit: Payer: Self-pay

## 2020-07-04 DIAGNOSIS — D649 Anemia, unspecified: Secondary | ICD-10-CM

## 2020-07-04 NOTE — Telephone Encounter (Signed)
Spoke with Hassan Rowan. She is aware that Dr. Ammie Ferrier office informed our office that pt d/c ASA on 06/26/20 indefinitely and will f/u with pt in 3 weeks to discuss decreasing dosage of Plavix and Eliquis. Per Dr. Gala Romney , they are to follow recommendations for anticoagulation medication from PCP Dr. Sabra Heck. Lab orders were mailed to pt.

## 2020-07-05 ENCOUNTER — Encounter (HOSPITAL_COMMUNITY): Payer: Self-pay | Admitting: Internal Medicine

## 2020-07-19 ENCOUNTER — Telehealth: Payer: Self-pay | Admitting: Internal Medicine

## 2020-07-19 NOTE — Telephone Encounter (Signed)
Pt is coming to office visit in the morning and has questions about having her labs done. [phone]

## 2020-07-20 ENCOUNTER — Encounter: Payer: Self-pay | Admitting: *Deleted

## 2020-07-20 ENCOUNTER — Encounter: Payer: Self-pay | Admitting: Internal Medicine

## 2020-07-20 ENCOUNTER — Other Ambulatory Visit: Payer: Self-pay

## 2020-07-20 ENCOUNTER — Ambulatory Visit (INDEPENDENT_AMBULATORY_CARE_PROVIDER_SITE_OTHER): Payer: Medicare Other | Admitting: Internal Medicine

## 2020-07-20 ENCOUNTER — Telehealth: Payer: Self-pay | Admitting: Internal Medicine

## 2020-07-20 VITALS — BP 164/58 | HR 67 | Temp 96.9°F | Ht 66.0 in | Wt 128.4 lb

## 2020-07-20 DIAGNOSIS — K219 Gastro-esophageal reflux disease without esophagitis: Secondary | ICD-10-CM | POA: Diagnosis not present

## 2020-07-20 DIAGNOSIS — D649 Anemia, unspecified: Secondary | ICD-10-CM

## 2020-07-20 NOTE — Progress Notes (Signed)
Referring Provider: Romeo Rabonaplan, Michael, MD Primary Care Physician:  Romeo Rabonaplan, Michael, MD Primary GI:  Dr. Marletta Lorarver  Chief Complaint  Patient presents with  . Anemia    [facility] d/t heart fluttering, dizziness and shortness of breath. Hgb 8.6. Restrarted Metoprolol. Stopped taking Iron because stool was so dark she couldn't see what was in stool.    HPI:   Julia Bouchardovella Hart is a 86 y.o. female who presents for follow-up visit.  Recently seen for acute on chronic anemia.  Underwent EGD by Dr. Jena Gaussourk which was largely unremarkable besides small hiatal hernia.  She is chronically on Eliquis and Plavix.  Was also previously on ASA but this has been discontinued.  She is currently holding her Eliquis and Plavix at the moment but patient's family member is very concerned about patient not being on this for risk of stroke and/or cardiac event.  Today she overall states she is doing well.  Notes that she had her hemoglobin checked a few days ago and it was 8.6.  Denies any melena hematochezia.  Also has chronic reflux which is well controlled on Protonix daily.   Past Medical History:  Diagnosis Date  . CAD (coronary artery disease)   . HTN (hypertension)   . Hyperlipidemia     Past Surgical History:  Procedure Laterality Date  . ABDOMINAL HYSTERECTOMY    . BIOPSY  01/13/2017   Procedure: BIOPSY;  Surgeon: West BaliFields, Sandi L, MD;  [facility];  Service: Endoscopy;;  duodenal gastric   . COLONOSCOPY N/A 01/13/2017   Procedure: COLONOSCOPY;  Surgeon: West BaliFields, Sandi L, MD;  [facility];  Service: Endoscopy;  Laterality: N/A;  3:00pm-moved to 1:45pm per Ginger  . CORONARY ANGIOPLASTY WITH STENT PLACEMENT  2018  . CORONARY ARTERY BYPASS GRAFT  2008  . ESOPHAGOGASTRODUODENOSCOPY N/A 01/13/2017   Procedure: ESOPHAGOGASTRODUODENOSCOPY (EGD);  Surgeon: West BaliFields, Sandi L, MD;  [facility];  Service: Endoscopy;  Laterality: N/A;  . ESOPHAGOGASTRODUODENOSCOPY  (EGD) WITH PROPOFOL N/A 06/29/2020   Procedure: ESOPHAGOGASTRODUODENOSCOPY (EGD) WITH PROPOFOL;  Surgeon: Corbin Adeourk, Robert M, MD;  [facility];  Service: Endoscopy;  Laterality: N/A;  1:45pm    Current Outpatient Medications  Medication Sig Dispense Refill  . amLODipine (NORVASC) 5 MG tablet Take 5 mg by mouth daily.    Marland Kitchen. atorvastatin (LIPITOR) 20 MG tablet Take 20 mg by mouth at bedtime.     . Calcium Carbonate-Vitamin D (CALTRATE 600+D PO) Take 1 tablet by mouth daily at 3 pm.     . docusate sodium (COLACE) 100 MG capsule Take 100 mg by mouth daily.     Marland Kitchen. ENTRESTO 97-103 MG Take 1 tablet by mouth 2 (two) times daily.    Marland Kitchen. epoetin alfa (EPOGEN,PROCRIT) 1610940000 UNIT/ML injection Inject into the skin as needed (low red blood cell count).     . hydrALAZINE (APRESOLINE) 50 MG tablet Take 50 mg by mouth 3 (three) times daily.     . isosorbide dinitrate (ISORDIL) 5 MG tablet Take 5 mg by mouth 2 (two) times daily.     . metoprolol succinate (TOPROL-XL) 25 MG 24 hr tablet Take 25 mg by mouth daily.    . Multiple Vitamins-Iron (MULTIVITAMINS WITH IRON) TABS tablet Take 1 tablet by mouth daily.    . nitroGLYCERIN (NITROSTAT) 0.4 MG SL tablet Place 0.4 mg under the tongue every 5 (five) minutes as needed for chest pain.    . pantoprazole (PROTONIX) 40 MG tablet  Take 40 mg by mouth 2 (two) times daily.     . prednisoLONE acetate (PRED FORTE) 1 % ophthalmic suspension Place 1 drop into both eyes every other day.    . spironolactone (ALDACTONE) 25 MG tablet Take 25 mg by mouth daily.     Marland Kitchen torsemide (DEMADEX) 20 MG tablet Take 20 mg by mouth every other day.     Marland Kitchen apixaban (ELIQUIS) 2.5 MG TABS tablet Take by mouth 2 (two) times daily. (Patient not taking: Reported on 07/20/2020)    . clopidogrel (PLAVIX) 75 MG tablet Take 75 mg by mouth daily. (Patient not taking: Reported on 07/20/2020)     No current facility-administered medications for this visit.    Allergies as of 07/20/2020  . (No Known  Allergies)    Family History  Problem Relation Age of Onset  . Colon cancer Neg Hx   . Colon polyps Neg Hx     Social History   Socioeconomic History  . Marital status: Single    Spouse name: Not on file  . Number of children: Not on file  . Years of education: Not on file  . Highest education level: Not on file  Occupational History  . Not on file  Tobacco Use  . Smoking status: Former Research scientist (life sciences)  . Smokeless tobacco: Never Used  Substance and Sexual Activity  . Alcohol use: No  . Drug use: No  . Sexual activity: Not on file  Other Topics Concern  . Not on file  Social History Narrative   QUIT SMOKING LONG TIME AGO. WORKED [place]. NO KIDS. NIECE HELPS CARE FOR HER.   Social Determinants of Health   Financial Resource Strain: Not on file  Food Insecurity: Not on file  Transportation Needs: Not on file  Physical Activity: Not on file  Stress: Not on file  Social Connections: Not on file    Subjective: Review of Systems  Constitutional: Negative for chills and fever.  HENT: Negative for congestion and hearing loss.   Eyes: Negative for blurred vision and double vision.  Respiratory: Negative for cough and shortness of breath.   Cardiovascular: Negative for chest pain and palpitations.  Gastrointestinal: Negative for abdominal pain, blood in stool, constipation, diarrhea, heartburn, melena and vomiting.  Genitourinary: Negative for dysuria and urgency.  Musculoskeletal: Negative for joint pain and myalgias.  Skin: Negative for itching and rash.  Neurological: Negative for dizziness and headaches.  Psychiatric/Behavioral: Negative for depression. The patient is not nervous/anxious.      Objective: BP (!) 164/58   Pulse 67   Temp (!) 96.9 F (36.1 C) (Temporal)   Ht 5\' 6"  (1.676 m)   Wt 128 lb 6.4 oz (58.2 kg)   BMI 20.72 kg/m  Physical Exam Constitutional:      Appearance: Normal appearance.  HENT:     Head: Normocephalic and atraumatic.  Eyes:      Extraocular Movements: Extraocular movements intact.     Conjunctiva/sclera: Conjunctivae normal.  Cardiovascular:     Rate and Rhythm: Normal rate and regular rhythm.     Heart sounds: Murmur heard.    Pulmonary:     Effort: Pulmonary effort is normal.     Breath sounds: Normal breath sounds.  Abdominal:     General: Bowel sounds are normal.     Palpations: Abdomen is soft.  Musculoskeletal:        General: No swelling. Normal range of motion.     Cervical back: Normal range of motion and  neck supple.  Skin:    General: Skin is warm and dry.     Coloration: Skin is not jaundiced.  Neurological:     General: No focal deficit present.     Mental Status: She is alert and oriented to person, place, and time.  Psychiatric:        Mood and Affect: Mood normal.        Behavior: Behavior normal.      Assessment: *Acute on chronic anemia *GERD-well-controlled on Protonix *Chronic anticoagulation  Plan: Discussed patient's anemia in depth with her and family member.  Patient is at high risk for further GI bleeding if she goes back on Eliquis and Plavix.  Patient's family member is very concerned about potential stroke and/or cardiac event if she does not take these medications.  I told her that I cannot safely recommend she go back on these without a full GI work-up.  I recommended capsule endoscopy and patient is more concerned about her colon and wishes to have a repeat colonoscopy.  We will start with capsule endoscopy at this time to evaluate for small bowel AVMs.  Patient is scheduled to see her cardiologist Dr. Hyacinth Meeker later today.  Will await his formal recommendations.  If the decision is made to stop anticoagulation and Plavix, we may be able to forego further GI work-up and monitor hemoglobin serially.  We will check hemoglobin today.  07/20/2020 1:11 PM   Disclaimer: This note was dictated with voice recognition software. Similar sounding words can inadvertently be  transcribed and may not be corrected upon review.

## 2020-07-20 NOTE — Patient Instructions (Signed)
We will schedule you for capsule endoscopy to further evaluate your anemia.  If this is negative, we will proceed with colonoscopy.  I will check blood counts today and call you with results.  At [facility] we value your feedback. You may receive a survey about your visit today. Please share your experience as we strive to create trusting relationships with our patients to provide genuine, compassionate, quality care.  We appreciate your understanding and patience as we review any laboratory studies, imaging, and other diagnostic tests that are ordered as we care for you. Our office policy is 5 business days for review of these results, and any emergent or urgent results are addressed in a timely manner for your best interest. If you do not hear from our office in 1 week, please contact us.   We also encourage the use of MyChart, which contains your medical information for your review as well. If you are not enrolled in this feature, an access code is on this after visit summary for your convenience. Thank you for allowing [place] to be involved in your care.  It was great to see you today!  I hope you have a great rest of your winter!!    Hennie Duos. Marletta Lor, D.O. Gastroenterology and Hepatology [facility]

## 2020-07-20 NOTE — Telephone Encounter (Signed)
Will discuss with the pt at her office visit this morning.

## 2020-07-20 NOTE — Telephone Encounter (Signed)
Patient niece called said that after the visit today they went to her other doctor appointment and he put her back on eloquis 2.5mg  and also plavix again.  She also wants to be called when her lab results come back so she can keep up with her hemoglobin

## 2020-07-20 NOTE — Telephone Encounter (Signed)
Dr. Marletta Lor the pts niece called back to advise you of her PCP putting the pt back on Eliquis 2.5 mg and they will be notified once here lab results return.

## 2020-07-21 LAB — CBC
Hematocrit: 30.2 % — ABNORMAL LOW (ref 34.0–46.6)
Hemoglobin: 9.6 g/dL — ABNORMAL LOW (ref 11.1–15.9)
MCH: 28.7 pg (ref 26.6–33.0)
MCHC: 31.8 g/dL (ref 31.5–35.7)
MCV: 90 fL (ref 79–97)
Platelets: 197 10*3/uL (ref 150–450)
RBC: 3.34 x10E6/uL — ABNORMAL LOW (ref 3.77–5.28)
RDW: 14.1 % (ref 11.7–15.4)
WBC: 4.7 10*3/uL (ref 3.4–10.8)

## 2020-07-25 ENCOUNTER — Telehealth: Payer: Self-pay | Admitting: Internal Medicine

## 2020-07-25 NOTE — Telephone Encounter (Signed)
Phoned the pt and her niece back and advised lab results have not been released to me yet. Once Dr. Marletta Lor releases I will call with the results.

## 2020-07-25 NOTE — Telephone Encounter (Signed)
Pt needs to speak to nurse about her labs. [phone]

## 2020-07-27 NOTE — Telephone Encounter (Signed)
Phoned the pt and spoke with her daughter Steward Drone and advised of the results.

## 2020-07-27 NOTE — Telephone Encounter (Signed)
Hgb stable at 9.6  

## 2020-08-03 ENCOUNTER — Encounter (HOSPITAL_COMMUNITY): Payer: Self-pay

## 2020-08-03 ENCOUNTER — Other Ambulatory Visit: Payer: Self-pay

## 2020-08-03 ENCOUNTER — Emergency Department (HOSPITAL_COMMUNITY): Payer: Medicare Other

## 2020-08-03 ENCOUNTER — Telehealth: Payer: Self-pay | Admitting: Internal Medicine

## 2020-08-03 ENCOUNTER — Inpatient Hospital Stay (HOSPITAL_COMMUNITY)
Admission: EM | Admit: 2020-08-03 | Discharge: 2020-08-07 | DRG: 291 | Disposition: A | Discharge status: Home / Self Care | Payer: Medicare Other | Attending: Internal Medicine | Admitting: Internal Medicine

## 2020-08-03 DIAGNOSIS — I252 Old myocardial infarction: Secondary | ICD-10-CM | POA: Diagnosis not present

## 2020-08-03 DIAGNOSIS — I255 Ischemic cardiomyopathy: Secondary | ICD-10-CM | POA: Diagnosis present

## 2020-08-03 DIAGNOSIS — K219 Gastro-esophageal reflux disease without esophagitis: Secondary | ICD-10-CM | POA: Diagnosis present

## 2020-08-03 DIAGNOSIS — U071 COVID-19: Secondary | ICD-10-CM | POA: Diagnosis present

## 2020-08-03 DIAGNOSIS — I350 Nonrheumatic aortic (valve) stenosis: Secondary | ICD-10-CM | POA: Diagnosis not present

## 2020-08-03 DIAGNOSIS — Z7901 Long term (current) use of anticoagulants: Secondary | ICD-10-CM | POA: Diagnosis not present

## 2020-08-03 DIAGNOSIS — I482 Chronic atrial fibrillation, unspecified: Secondary | ICD-10-CM | POA: Diagnosis present

## 2020-08-03 DIAGNOSIS — I2510 Atherosclerotic heart disease of native coronary artery without angina pectoris: Secondary | ICD-10-CM | POA: Diagnosis present

## 2020-08-03 DIAGNOSIS — I110 Hypertensive heart disease with heart failure: Secondary | ICD-10-CM | POA: Diagnosis present

## 2020-08-03 DIAGNOSIS — I10 Essential (primary) hypertension: Secondary | ICD-10-CM | POA: Diagnosis not present

## 2020-08-03 DIAGNOSIS — I25810 Atherosclerosis of coronary artery bypass graft(s) without angina pectoris: Secondary | ICD-10-CM | POA: Diagnosis present

## 2020-08-03 DIAGNOSIS — I351 Nonrheumatic aortic (valve) insufficiency: Secondary | ICD-10-CM | POA: Diagnosis not present

## 2020-08-03 DIAGNOSIS — I5023 Acute on chronic systolic (congestive) heart failure: Secondary | ICD-10-CM | POA: Diagnosis present

## 2020-08-03 DIAGNOSIS — Z79899 Other long term (current) drug therapy: Secondary | ICD-10-CM

## 2020-08-03 DIAGNOSIS — E876 Hypokalemia: Secondary | ICD-10-CM | POA: Diagnosis present

## 2020-08-03 DIAGNOSIS — Z7902 Long term (current) use of antithrombotics/antiplatelets: Secondary | ICD-10-CM

## 2020-08-03 DIAGNOSIS — I509 Heart failure, unspecified: Secondary | ICD-10-CM

## 2020-08-03 DIAGNOSIS — E785 Hyperlipidemia, unspecified: Secondary | ICD-10-CM | POA: Diagnosis present

## 2020-08-03 LAB — RESP PANEL BY RT-PCR (FLU A&B, COVID) ARPGX2
Influenza A by PCR: NEGATIVE
Influenza B by PCR: NEGATIVE
SARS Coronavirus 2 by RT PCR: POSITIVE — AB

## 2020-08-03 LAB — COMPREHENSIVE METABOLIC PANEL
ALT: 18 U/L (ref 0–44)
AST: 30 U/L (ref 15–41)
Albumin: 3.1 g/dL — ABNORMAL LOW (ref 3.5–5.0)
Alkaline Phosphatase: 92 U/L (ref 38–126)
Anion gap: 7 (ref 5–15)
BUN: 26 mg/dL — ABNORMAL HIGH (ref 8–23)
CO2: 25 mmol/L (ref 22–32)
Calcium: 8.2 mg/dL — ABNORMAL LOW (ref 8.9–10.3)
Chloride: 101 mmol/L (ref 98–111)
Creatinine, Ser: 1.21 mg/dL — ABNORMAL HIGH (ref 0.44–1.00)
GFR, Estimated: 42 mL/min — ABNORMAL LOW (ref >60)
Glucose, Bld: 121 mg/dL — ABNORMAL HIGH (ref 70–99)
Potassium: 3.5 mmol/L (ref 3.5–5.1)
Sodium: 133 mmol/L — ABNORMAL LOW (ref 135–145)
Total Bilirubin: 0.5 mg/dL (ref 0.3–1.2)
Total Protein: 6.5 g/dL (ref 6.5–8.1)

## 2020-08-03 LAB — CBC WITH DIFFERENTIAL/PLATELET
Abs Immature Granulocytes: 0.02 10*3/uL (ref 0.00–0.07)
Basophils Absolute: 0 10*3/uL (ref 0.0–0.1)
Basophils Relative: 1 %
Eosinophils Absolute: 0.2 10*3/uL (ref 0.0–0.5)
Eosinophils Relative: 4 %
HCT: 28.5 % — ABNORMAL LOW (ref 36.0–46.0)
Hemoglobin: 9.2 g/dL — ABNORMAL LOW (ref 12.0–15.0)
Immature Granulocytes: 0 %
Lymphocytes Relative: 21 %
Lymphs Abs: 1 10*3/uL (ref 0.7–4.0)
MCH: 29.1 pg (ref 26.0–34.0)
MCHC: 32.3 g/dL (ref 30.0–36.0)
MCV: 90.2 fL (ref 80.0–100.0)
Monocytes Absolute: 0.6 10*3/uL (ref 0.1–1.0)
Monocytes Relative: 13 %
Neutro Abs: 2.8 10*3/uL (ref 1.7–7.7)
Neutrophils Relative %: 61 %
Platelets: 180 10*3/uL (ref 150–400)
RBC: 3.16 MIL/uL — ABNORMAL LOW (ref 3.87–5.11)
RDW: 14.9 % (ref 11.5–15.5)
WBC: 4.6 10*3/uL (ref 4.0–10.5)
nRBC: 0 % (ref 0.0–0.2)

## 2020-08-03 LAB — TROPONIN I (HIGH SENSITIVITY): Troponin I (High Sensitivity): 63 ng/L — ABNORMAL HIGH (ref <18)

## 2020-08-03 LAB — PROCALCITONIN: Procalcitonin: <0.1 ng/mL

## 2020-08-03 LAB — LACTATE DEHYDROGENASE: LDH: 221 U/L — ABNORMAL HIGH (ref 98–192)

## 2020-08-03 LAB — FERRITIN: Ferritin: 72 ng/mL (ref 11–307)

## 2020-08-03 LAB — C-REACTIVE PROTEIN: CRP: 0.7 mg/dL (ref <1.0)

## 2020-08-03 LAB — FIBRINOGEN: Fibrinogen: 364 mg/dL (ref 210–475)

## 2020-08-03 LAB — BRAIN NATRIURETIC PEPTIDE: B Natriuretic Peptide: 3434 pg/mL — ABNORMAL HIGH (ref 0.0–100.0)

## 2020-08-03 LAB — D-DIMER, QUANTITATIVE: D-Dimer, Quant: 1.57 ug/mL-FEU — ABNORMAL HIGH (ref 0.00–0.50)

## 2020-08-03 MED ORDER — ALBUTEROL SULFATE HFA 108 (90 BASE) MCG/ACT IN AERS
2.0000 | INHALATION_SPRAY | Freq: Four times a day (QID) | RESPIRATORY_TRACT | Status: DC
  Administered 2020-08-04 (×2): 2 via RESPIRATORY_TRACT
  Filled 2020-08-03: qty 6.7

## 2020-08-03 MED ORDER — ZINC SULFATE 220 (50 ZN) MG PO CAPS
220.0000 mg | ORAL_CAPSULE | Freq: Every day | ORAL | Status: DC
  Administered 2020-08-03 – 2020-08-07 (×5): 220 mg via ORAL
  Filled 2020-08-03 (×5): qty 1

## 2020-08-03 MED ORDER — DEXAMETHASONE SODIUM PHOSPHATE 10 MG/ML IJ SOLN
6.0000 mg | INTRAMUSCULAR | Status: DC
  Administered 2020-08-03: 6 mg via INTRAVENOUS
  Filled 2020-08-03: qty 1

## 2020-08-03 MED ORDER — GUAIFENESIN-DM 100-10 MG/5ML PO SYRP: 10.0000 mL | ORAL_SOLUTION | ORAL | Status: DC | PRN

## 2020-08-03 MED ORDER — HYDRALAZINE HCL 20 MG/ML IJ SOLN: 10.0000 mg | INTRAMUSCULAR | Status: DC | PRN

## 2020-08-03 MED ORDER — SODIUM CHLORIDE 0.9 % IV SOLN
100.0000 mg | Freq: Once | INTRAVENOUS | Status: AC
  Administered 2020-08-04: 100 mg via INTRAVENOUS
  Filled 2020-08-03: qty 20

## 2020-08-03 MED ORDER — ONDANSETRON HCL 4 MG/2ML IJ SOLN: 4.0000 mg | Freq: Four times a day (QID) | INTRAMUSCULAR | Status: DC | PRN

## 2020-08-03 MED ORDER — APIXABAN 2.5 MG PO TABS
2.5000 mg | ORAL_TABLET | Freq: Two times a day (BID) | ORAL | Status: DC
  Administered 2020-08-03 – 2020-08-07 (×8): 2.5 mg via ORAL
  Filled 2020-08-03 (×8): qty 1

## 2020-08-03 MED ORDER — FUROSEMIDE 10 MG/ML IJ SOLN
40.0000 mg | INTRAMUSCULAR | Status: AC
  Administered 2020-08-03: 40 mg via INTRAVENOUS
  Filled 2020-08-03: qty 4

## 2020-08-03 MED ORDER — POLYETHYLENE GLYCOL 3350 17 G PO PACK
17.0000 g | PACK | Freq: Every day | ORAL | Status: DC | PRN
  Administered 2020-08-06: 17 g via ORAL
  Filled 2020-08-03: qty 1

## 2020-08-03 MED ORDER — ACETAMINOPHEN 650 MG RE SUPP: 650.0000 mg | Freq: Four times a day (QID) | RECTAL | Status: DC | PRN

## 2020-08-03 MED ORDER — ASCORBIC ACID 500 MG PO TABS
500.0000 mg | ORAL_TABLET | Freq: Every day | ORAL | Status: DC
  Administered 2020-08-03 – 2020-08-07 (×5): 500 mg via ORAL
  Filled 2020-08-03 (×5): qty 1

## 2020-08-03 MED ORDER — SODIUM CHLORIDE 0.9 % IV SOLN: 100.0000 mg | Freq: Every day | INTRAVENOUS | Status: DC

## 2020-08-03 MED ORDER — ACETAMINOPHEN 325 MG PO TABS: 650.0000 mg | ORAL_TABLET | Freq: Four times a day (QID) | ORAL | Status: DC | PRN

## 2020-08-03 MED ORDER — SODIUM CHLORIDE 0.9 % IV SOLN
100.0000 mg | Freq: Every day | INTRAVENOUS | Status: DC
  Administered 2020-08-04 – 2020-08-06 (×3): 100 mg via INTRAVENOUS
  Filled 2020-08-03 (×3): qty 20

## 2020-08-03 MED ORDER — FUROSEMIDE 10 MG/ML IJ SOLN
40.0000 mg | Freq: Two times a day (BID) | INTRAMUSCULAR | Status: AC
  Administered 2020-08-04 – 2020-08-06 (×6): 40 mg via INTRAVENOUS
  Filled 2020-08-03 (×6): qty 4

## 2020-08-03 MED ORDER — SODIUM CHLORIDE 0.9 % IV SOLN
200.0000 mg | Freq: Once | INTRAVENOUS | Status: DC
  Filled 2020-08-03: qty 40

## 2020-08-03 MED ORDER — ATORVASTATIN CALCIUM 20 MG PO TABS
20.0000 mg | ORAL_TABLET | Freq: Every day | ORAL | Status: DC
  Administered 2020-08-03 – 2020-08-06 (×4): 20 mg via ORAL
  Filled 2020-08-03 (×4): qty 1

## 2020-08-03 MED ORDER — ONDANSETRON HCL 4 MG PO TABS: 4.0000 mg | ORAL_TABLET | Freq: Four times a day (QID) | ORAL | Status: DC | PRN

## 2020-08-03 NOTE — ED Notes (Signed)
Report given to [employer], RN on unit 300 at this facility.  Patient can be transported to unit.

## 2020-08-03 NOTE — ED Triage Notes (Signed)
Pt reports that she has been building up fluid in her legs for awhile. Sob with exertion. Unable to give me a time frame on swelling

## 2020-08-03 NOTE — Telephone Encounter (Addendum)
Spoke to niece.  She informed me that pt has swelling (fluid) all over, short of breath, and stomach hurts to touch.  Advised them to take pt to ER to be evaluated.  Informed her that I would route note to Dr. Abbey Chatters as well as Juluis Rainier.

## 2020-08-03 NOTE — Telephone Encounter (Signed)
Pt's family member, Mrs Marden Noble, called to say that patient is scheduled procedure with Dr Abbey Chatters on 08/09/2020, but she has a lot of fluid build up in her legs. She doesn't know if she needs to go to ER at [facility] or go to [place]. Please call Hassan Rowan at [phone]

## 2020-08-03 NOTE — H&P (Signed)
History and Physical    Cj Beecher LHT:[id number] DOB: [birth year] DOA: 08/03/2020  PCP: [name], MD   Patient coming from: Home  I have personally briefly reviewed patient's old medical records in [place]  Chief Complaint: Difficulty breathing  HPI: Julia Hart is a 87 y.o. female with medical history significant for  CAD- CABG, HTN.  Patient presented to the ED with complaints of difficulty breathing and swelling of lower extremities.  She tells me symptoms started on the fifth of this month-days ago.  She denies chest pain. Patient has gotten 3 doses of her COVID-vaccine.  She reports mild intermittent cough for which she takes Mucinex.  She denies body aches or fevers.  Patient denies known sick contacts.  Patient was previously taking torsemide 20 mg every other day, but niece reports that since swelling started patient has been taking torsemide 20 mg every day over the past 5 days.  ED Course: Temperature 98.1.  Heart rate 69-75, respiratory rate 18-24, blood pressure systolic 572I to 203T, O2 sats > 95% on room air.  COVID test positive.  Troponin 63.  BNP markedly elevated at 3434.  Portable chest x-ray findings consistent of severe CHF, atypical pneumonia not excluded. IV Lasix 40 mg x 1 given.  Hospitalist to admit for CHF exacerbation.  Review of Systems: As per HPI all other systems reviewed and negative.  Past Medical History:  Diagnosis Date  . CAD (coronary artery disease)   . HTN (hypertension)   . Hyperlipidemia     Past Surgical History:  Procedure Laterality Date  . ABDOMINAL HYSTERECTOMY    . BIOPSY  01/13/2017   Procedure: BIOPSY;  Surgeon: Danie Binder, MD;  [facility];  Service: Endoscopy;;  duodenal gastric   . COLONOSCOPY N/A 01/13/2017   Procedure: COLONOSCOPY;  Surgeon: Danie Binder, MD;  [facility];  Service: Endoscopy;  Laterality: N/A;  3:00pm-moved to 1:45pm per Ginger  . CORONARY ANGIOPLASTY WITH  STENT PLACEMENT  2018  . CORONARY ARTERY BYPASS GRAFT  2008  . ESOPHAGOGASTRODUODENOSCOPY N/A 01/13/2017   Procedure: ESOPHAGOGASTRODUODENOSCOPY (EGD);  Surgeon: Danie Binder, MD;  [facility];  Service: Endoscopy;  Laterality: N/A;  . ESOPHAGOGASTRODUODENOSCOPY (EGD) WITH PROPOFOL N/A 06/29/2020   Procedure: ESOPHAGOGASTRODUODENOSCOPY (EGD) WITH PROPOFOL;  Surgeon: Daneil Dolin, MD;  [facility];  Service: Endoscopy;  Laterality: N/A;  1:45pm     reports that she has quit smoking. She has never used smokeless tobacco. She reports that she does not drink alcohol and does not use drugs.  No Known Allergies  Family History  Problem Relation Age of Onset  . Colon cancer Neg Hx   . Colon polyps Neg Hx     Prior to Admission medications   Medication Sig Start Date End Date Taking? Authorizing Provider  amLODipine (NORVASC) 5 MG tablet Take 5 mg by mouth daily.    [provider]  apixaban (ELIQUIS) 2.5 MG TABS tablet Take by mouth 2 (two) times daily. Patient not taking: Reported on 07/20/2020    [provider]  atorvastatin (LIPITOR) 20 MG tablet Take 20 mg by mouth at bedtime.     [provider]  Calcium Carbonate-Vitamin D (CALTRATE 600+D PO) Take 1 tablet by mouth daily at 3 pm.     [provider]  clopidogrel (PLAVIX) 75 MG tablet Take 75 mg by mouth daily. Patient not taking: Reported on 07/20/2020    [provider]  docusate sodium (COLACE) 100 MG capsule Take 100 mg by mouth daily.     [provider]  ENTRESTO 97-103 MG Take 1 tablet by mouth 2 (two) times daily. 06/13/20   [provider]  epoetin alfa (EPOGEN,PROCRIT) 08144 UNIT/ML injection Inject into the skin as needed (low red blood cell count).     [provider]  hydrALAZINE (APRESOLINE) 50 MG tablet Take 50 mg by mouth 3 (three) times daily.  10/29/19   [provider]  isosorbide dinitrate (ISORDIL) 5 MG tablet  Take 5 mg by mouth 2 (two) times daily.  04/24/20   [provider]  metoprolol succinate (TOPROL-XL) 25 MG 24 hr tablet Take 25 mg by mouth daily.    [provider]  Multiple Vitamins-Iron (MULTIVITAMINS WITH IRON) TABS tablet Take 1 tablet by mouth daily.    [provider]  nitroGLYCERIN (NITROSTAT) 0.4 MG SL tablet Place 0.4 mg under the tongue every 5 (five) minutes as needed for chest pain.    [provider]  pantoprazole (PROTONIX) 40 MG tablet Take 40 mg by mouth 2 (two) times daily.  05/17/20   [provider]  prednisoLONE acetate (PRED FORTE) 1 % ophthalmic suspension Place 1 drop into both eyes every other day. 03/15/20   [provider]  spironolactone (ALDACTONE) 25 MG tablet Take 25 mg by mouth daily.  05/16/20   [provider]  torsemide (DEMADEX) 20 MG tablet Take 20 mg by mouth every other day.  05/27/20   [provider]    Physical Exam: Vitals:   08/03/20 1830 08/03/20 1900 08/03/20 1930 08/03/20 2000  BP: (!) 156/42 (!) 151/50 (!) 186/52 (!) 171/50  Pulse: 70 71  69  Resp:  18 18 18   Temp:  98.1 F (36.7 C)    TempSrc:  Oral    SpO2: 94% 96%  95%  Weight:        Constitutional: NAD, calm, comfortable Vitals:   08/03/20 1830 08/03/20 1900 08/03/20 1930 08/03/20 2000  BP: (!) 156/42 (!) 151/50 (!) 186/52 (!) 171/50  Pulse: 70 71  69  Resp:  18 18 18   Temp:  98.1 F (36.7 C)    TempSrc:  Oral    SpO2: 94% 96%  95%  Weight:       Eyes: PERRL, lids and conjunctivae normal ENMT: Mucous membranes are moist.  Neck: normal, supple, no masses, no thyromegaly Respiratory:  Normal respiratory effort. No accessory muscle use.  Cardiovascular: Regular rate and rhythm, no murmurs / rubs / gallops.  1+ pitting extremity edema to upper third of legs, bilateral lower extremity warm and well-perfused.    Abdomen: no tenderness, no masses palpated. No hepatosplenomegaly. Bowel sounds positive.   Musculoskeletal: no clubbing / cyanosis. No joint deformity upper and lower extremities. Good ROM, no contractures. Normal muscle tone.  Skin: no rashes, lesions, ulcers. No induration Neurologic: No apparent clinical emergency, when extremities spontaneously. Psychiatric: Normal judgment and insight. Alert and oriented x 3. Normal mood.   Labs on Admission: I have personally reviewed following labs and imaging studies  CBC: Recent Labs  Lab 08/03/20 1837  WBC 4.6  NEUTROABS 2.8  HGB 9.2*  HCT 28.5*  MCV 90.2  PLT 818   Basic Metabolic Panel: Recent Labs  Lab 08/03/20 1837  NA 133*  K 3.5  CL 101  CO2 25  GLUCOSE 121*  BUN 26*  CREATININE 1.21*  CALCIUM 8.2*   Liver Function Tests: Recent Labs  Lab 08/03/20 1837  AST 30  ALT 18  ALKPHOS 92  BILITOT 0.5  PROT 6.5  ALBUMIN 3.1*    Radiological Exams on Admission: DG Chest Port 1 View  Result Date: 08/03/2020 CLINICAL DATA:  87 year old female with shortness of breath. EXAM: PORTABLE CHEST 1 VIEW COMPARISON:  None. FINDINGS: There is cardiomegaly with vascular congestion and mild edema. Atypical pneumonia is not excluded clinical correlation is recommended. Probable trace bilateral pleural effusion. No pneumothorax. Median sternotomy wires and CABG vascular clips. Atherosclerotic calcification the aorta. No acute osseous pathology. IMPRESSION: Cardiomegaly with findings of CHF. Atypical pneumonia is not excluded. Electronically Signed   By: Anner Crete M.D.   On: 08/03/2020 19:17    EKG: Pending  Assessment/Plan Principal Problem:   Decompensated heart failure (HCC) Active Problems:   Coronary artery disease   HTN (hypertension)   COVID-19 virus infection   Atrial fibrillation, chronic (HCC)    Acute on chronic congestive heart failure-type unspecified.  Bilateral lower extremity edema, chest x-ray consistent with CHF.  BNP markedly elevated at 3434.  No prior BNP to compare.  Patient[facility] cardi[facility].  Per chart marked weigt gain more than 122 on 06/21/20 -140 today.  Last echo per care everywhere 2019- EF 50%.  Home medications torsemide 20mg   -IV Lasix 40 mg twice daily -Strict input output, daily weights -BMP daily - Obtain echocardiogram  -Pending med reconciliation resume Entresto, spironolactone.  -Hold home torsemide 20 mg daily  COVID infection-on room air.  Chest x-ray findings of CHF, atypical pneumonia not excluded. Fully vaccinated against COVID x3 with Moderna vaccine.  No specific COVID symptoms to determine duration of infection.  - Considering advanced age, comorbidities, and unable to exclude COVID-pneumonia, will start remdesivir pharmacy to dose - IV Dexamethasone 6 mg daily -Obtain and trend inflammatory markers - Mucolytic's, incentive spirometry, multivitamins -COVID admission protocol  Atrial fibrillation-rate controlled and on chronic anticoagulation with Eliquis. - Resume Eliquis, confirmed -Resume metoprolol pending med reconciliation  Coronary artery disease-history of CABG 2008, and stent placement 2018. -Resume Plavix, Imdur, metoprolol  HTN-blood pressure elevated. -Pending med reconciliation resume Norvasc, hydralazine, Imdur, metoprolol, spironolactone  - PRN Hydralazine for systolic greater than 99991111.   DVT prophylaxis: Eliquis. Code Status: Limited code.  CPR and medications only.  Confirmed with patient's niece-Brenda Witcher on the phone, who patient confirms is her caretaker and primary decision maker.    Family Communication: Spoke to patient's niece Santo Held on the phone, herself and patient's brother at patient's primary decision-makers. Disposition Plan: ~ /> 2 days Consults called: none Admission status: Inpatient, telemetry I certify that at the point of admission it is my clinical judgment that the patient will require inpatient hospital care spanning beyond 2 midnights from the point of admission due to high intensity  of service, high risk for further deterioration and high frequency of surveillance required. The following factors support the patient status of inpatient: Decompensated CHF in the setting of COVID infection requiring IV diuretics and COVID medications.   Bethena Roys MD [facility]  08/03/2020, 8:13 PM

## 2020-08-03 NOTE — ED Provider Notes (Signed)
[facility] EMERGENCY DEPARTMENT Provider Note   CSN: [id number] Arrival date & time: 08/03/20  1746     History Chief Complaint  Patient presents with  . Leg Swelling    Julia Hart is a 87 y.o. female.  HPI   This patient is a 87 year old female, she has a known history of coronary disease, hypertension and hyperlipidemia in that she has had a coronary bypass graft in the past, she has a known history of congestive heart failure and takes the following medications  Entresto Torsemide 20 mg every other day Imdur 5 mg twice a day Hydralazine 50 mg 3 times a day Mirtazapine 7.5 mg every other day Spironolactone 25 mg daily Atorvastatin 20 mg daily, Pantoprazole 40 mg daily Amlodipine 5 mg daily Plavix 75 mg daily Eliquis 2.5 mg twice a day  Her primary cardiologist is in [place].  She recently has been told to take less of her diuretic, she does not know why, she states that over the last week or 2 she has had progressive swelling of her legs, increasing shortness of breath on exertion and orthopnea to the point where she is very dyspneic today.  She has no chest pain, no fevers, symptoms are gradually worsening and become severe.  I have reviewed the medical record, the patient has been seen at [facility] cardiology several years ago in relation to ventricular tachycardia The patient does not have an echocardiogram in this system Past Medical History:  Diagnosis Date  . CAD (coronary artery disease)   . HTN (hypertension)   . Hyperlipidemia     Patient Active Problem List   Diagnosis Date Noted  . GERD (gastroesophageal reflux disease) 07/20/2020  . Melena 06/29/2020  . Abdominal pain 06/29/2020  . Loss of weight   . Anemia 12/19/2016  . Change in bowel habits 12/19/2016    Past Surgical History:  Procedure Laterality Date  . ABDOMINAL HYSTERECTOMY    . BIOPSY  01/13/2017   Procedure: BIOPSY;  Surgeon: Danie Binder, MD;  Location: [facility] END[facility];   Service: Endoscopy;;  duodenal gastric   . COLONOSCOPY N/A 01/13/2017   Procedure: COLONOSCOPY;  Surgeon: Danie Binder, MD;  Location: [facility];  Service: Endoscopy;  Laterality: N/A;  3:00pm-moved to 1:45pm per Ginger  . CORONARY ANGIOPLASTY WITH STENT PLACEMENT  2018  . CORONARY ARTERY BYPASS GRAFT  2008  . ESOPHAGOGASTRODUODENOSCOPY N/A 01/13/2017   Procedure: ESOPHAGOGASTRODUODENOSCOPY (EGD);  Surgeon: Danie Binder, MD;  Location: [facility];  Service: Endoscopy;  Laterality: N/A;  . ESOPHAGOGASTRODUODENOSCOPY (EGD) WITH PROPOFOL N/A 06/29/2020   Procedure: ESOPHAGOGASTRODUODENOSCOPY (EGD) WITH PROPOFOL;  Surgeon: Daneil Dolin, MD;  Location: [facility];  Service: Endoscopy;  Laterality: N/A;  1:45pm     OB History   No obstetric history on file.     Family History  Problem Relation Age of Onset  . Colon cancer Neg Hx   . Colon polyps Neg Hx     Social History   Tobacco Use  . Smoking status: Former Research scientist (life sciences)  . Smokeless tobacco: Never Used  Substance Use Topics  . Alcohol use: No  . Drug use: No    Home Medications Prior to Admission medications   Medication Sig Start Date End Date Taking? Authorizing Provider  amLODipine (NORVASC) 5 MG tablet Take 5 mg by mouth daily.    [provider]  apixaban (ELIQUIS) 2.5 MG TABS tablet Take by mouth 2 (two) times daily. Patient not taking: Reported  on 07/20/2020    [provider]  atorvastatin (LIPITOR) 20 MG tablet Take 20 mg by mouth at bedtime.     [provider]  Calcium Carbonate-Vitamin D (CALTRATE 600+D PO) Take 1 tablet by mouth daily at 3 pm.     [provider]  clopidogrel (PLAVIX) 75 MG tablet Take 75 mg by mouth daily. Patient not taking: Reported on 07/20/2020    [provider]  docusate sodium (COLACE) 100 MG capsule Take 100 mg by mouth daily.     [provider]  ENTRESTO 97-103 MG Take 1 tablet by mouth 2 (two) times daily. 06/13/20    [provider]  epoetin alfa (EPOGEN,PROCRIT) 40981 UNIT/ML injection Inject into the skin as needed (low red blood cell count).     [provider]  hydrALAZINE (APRESOLINE) 50 MG tablet Take 50 mg by mouth 3 (three) times daily.  10/29/19   [provider]  isosorbide dinitrate (ISORDIL) 5 MG tablet Take 5 mg by mouth 2 (two) times daily.  04/24/20   [provider]  metoprolol succinate (TOPROL-XL) 25 MG 24 hr tablet Take 25 mg by mouth daily.    [provider]  Multiple Vitamins-Iron (MULTIVITAMINS WITH IRON) TABS tablet Take 1 tablet by mouth daily.    [provider]  nitroGLYCERIN (NITROSTAT) 0.4 MG SL tablet Place 0.4 mg under the tongue every 5 (five) minutes as needed for chest pain.    [provider]  pantoprazole (PROTONIX) 40 MG tablet Take 40 mg by mouth 2 (two) times daily.  05/17/20   [provider]  prednisoLONE acetate (PRED FORTE) 1 % ophthalmic suspension Place 1 drop into both eyes every other day. 03/15/20   [provider]  spironolactone (ALDACTONE) 25 MG tablet Take 25 mg by mouth daily.  05/16/20   [provider]  torsemide (DEMADEX) 20 MG tablet Take 20 mg by mouth every other day.  05/27/20   [provider]    Allergies    Patient has no known allergies.  Review of Systems   Review of Systems  All other systems reviewed and are negative.   Physical Exam Updated Vital Signs BP (!) 151/50 (BP Location: Right Arm)   Pulse 71   Temp 98.1 F (36.7 C) (Oral)   Resp 18   Wt 63.8 kg   SpO2 96%   BMI 22.69 kg/m   Physical Exam Vitals and nursing note reviewed.  Constitutional:      General: She is not in acute distress.    Appearance: She is well-developed and well-nourished.  HENT:     Head: Normocephalic and atraumatic.     Mouth/Throat:     Mouth: Oropharynx is clear and moist.     Pharynx: No oropharyngeal exudate.  Eyes:     General: No scleral  icterus.       Right eye: No discharge.        Left eye: No discharge.     Extraocular Movements: EOM normal.     Conjunctiva/sclera: Conjunctivae normal.     Pupils: Pupils are equal, round, and reactive to light.  Neck:     Thyroid: No thyromegaly.     Vascular: No JVD.     Comments: JVD bilaterally in the upright position to the angle of the jaw Cardiovascular:     Rate and Rhythm: Normal rate and regular rhythm.     Pulses: Intact distal pulses.     Heart sounds: Murmur heard.  No friction rub. No gallop.   Pulmonary:     Effort: No respiratory distress.     Breath sounds: Rales present. No wheezing.     Comments: Mild tachypnea - rales bialterally Abdominal:     General: Bowel sounds are normal. There is no distension.     Palpations: Abdomen is soft. There is no mass.     Tenderness: There is no abdominal tenderness.  Musculoskeletal:        General: No tenderness. Normal range of motion.     Cervical back: Normal range of motion and neck supple.     Right lower leg: Edema present.     Left lower leg: Edema present.  Lymphadenopathy:     Cervical: No cervical adenopathy.  Skin:    General: Skin is warm and dry.     Findings: No erythema or rash.  Neurological:     General: No focal deficit present.     Mental Status: She is alert.     Coordination: Coordination normal.  Psychiatric:        Mood and Affect: Mood and affect normal.        Behavior: Behavior normal.      ED Results / Procedures / Treatments   Labs (all labs ordered are listed, but only abnormal results are displayed) Labs Reviewed  RESP PANEL BY RT-PCR (FLU A&B, COVID) ARPGX2 - Abnormal; Notable for the following components:      Result Value   SARS Coronavirus 2 by RT PCR POSITIVE (*)    All other components within normal limits  CBC WITH DIFFERENTIAL/PLATELET - Abnormal; Notable for the following components:   RBC 3.16 (*)    Hemoglobin 9.2 (*)    HCT 28.5 (*)    All other components within  normal limits  COMPREHENSIVE METABOLIC PANEL - Abnormal; Notable for the following components:   Sodium 133 (*)    Glucose, Bld 121 (*)    BUN 26 (*)    Creatinine, Ser 1.21 (*)    Calcium 8.2 (*)    Albumin 3.1 (*)    GFR, Estimated 42 (*)    All other components within normal limits  BRAIN NATRIURETIC PEPTIDE - Abnormal; Notable for the following components:   B Natriuretic Peptide 3,434.0 (*)    All other components within normal limits  TROPONIN I (HIGH SENSITIVITY) - Abnormal; Notable for the following components:   Troponin I (High Sensitivity) 63 (*)    All other components within normal limits    EKG None  Radiology DG Chest Port 1 View  Result Date: 08/03/2020 CLINICAL DATA:  87 year old female with shortness of breath. EXAM: PORTABLE CHEST 1 VIEW COMPARISON:  None. FINDINGS: There is cardiomegaly with vascular congestion and mild edema. Atypical pneumonia is not excluded clinical correlation is recommended. Probable trace bilateral pleural effusion. No pneumothorax. Median sternotomy wires and CABG vascular clips. Atherosclerotic calcification the aorta. No acute osseous pathology. IMPRESSION: Cardiomegaly with findings of CHF. Atypical pneumonia is not excluded. Electronically Signed   By: Anner Crete M.D.   On: 08/03/2020 19:17    Procedures .Critical Care Performed by: Noemi Chapel, MD Authorized by: Noemi Chapel, MD   Critical care provider statement:    Critical care time (minutes):  35   Critical care time was exclusive of:  Separately billable procedures and treating other patients and teaching time   Critical care was necessary to treat or prevent imminent or life-threatening deterioration of the following conditions:  Respiratory failure and  cardiac failure   Critical care was time spent personally by me on the following activities:  Blood draw for specimens, development of treatment plan with patient or surrogate, discussions with consultants, evaluation  of patient's response to treatment, examination of patient, obtaining history from patient or surrogate, ordering and performing treatments and interventions, ordering and review of laboratory studies, ordering and review of radiographic studies, pulse oximetry, re-evaluation of patient's condition and review of old charts   (including critical care time)  Medications Ordered in ED Medications  furosemide (LASIX) injection 40 mg (40 mg Intravenous Given 08/03/20 1900)    ED Course  I have reviewed the triage vital signs and the nursing notes.  Pertinent labs & imaging results that were available during my care of the patient were reviewed by me and considered in my medical decision making (see chart for details).  Clinical Course as of 08/03/20 1955  Thu Aug 03, 2020  1939 I have personally viewed the x-ray, there does appear to be significant cardiomegaly with some pulmonary edema, no significant effusions or infiltrates, there is some hyperexpansion. [BM]  1939 Due to the correspondence of findings including an elevated BNP with cardiomegaly and findings of CHF the patient will need to be admitted.  She has significant JVD, and she is hypertensive [BM]    Clinical Course User Index [BM] Noemi Chapel, MD   MDM Rules/Calculators/A&P                          Constellation of orthopnea dyspnea on exertion JVD and peripheral edema suggest that she has a congestive heart failure exacerbation which may be brought on by insufficient amounts of diuretic.  The patient will need to have diuresis, she will likely need to be admitted to the hospital due to her severe age severe symptoms hypoxia tachypnea and need for inpatient diuresis.  The patient has had a positive COVID test, I do not see acute infiltrates on the x-ray but this may be in some part contributing to her decompensated congestive heart failure.  Precautions will be maintained, hospitalist will be paged for admission  Julia Hart  was evaluated in Emergency Department on 08/03/2020 for the symptoms described in the history of present illness. She was evaluated in the context of the global COVID-19 pandemic, which necessitated consideration that the patient might be at risk for infection with the SARS-CoV-2 virus that causes COVID-19. Institutional protocols and algorithms that pertain to the evaluation of patients at risk for COVID-19 are in a state of rapid change based on information released by regulatory bodies including the CDC and federal and state organizations. These policies and algorithms were followed during the patient's care in the ED.   Final Clinical Impression(s) / ED Diagnoses Final diagnoses:  Acute congestive heart failure, unspecified heart failure type ([place])  COVID-19      Noemi Chapel, MD 08/03/20 2009

## 2020-08-04 ENCOUNTER — Inpatient Hospital Stay (HOSPITAL_COMMUNITY): Payer: Medicare Other

## 2020-08-04 DIAGNOSIS — I255 Ischemic cardiomyopathy: Secondary | ICD-10-CM | POA: Diagnosis not present

## 2020-08-04 DIAGNOSIS — I350 Nonrheumatic aortic (valve) stenosis: Secondary | ICD-10-CM | POA: Diagnosis not present

## 2020-08-04 DIAGNOSIS — I10 Essential (primary) hypertension: Secondary | ICD-10-CM

## 2020-08-04 DIAGNOSIS — I351 Nonrheumatic aortic (valve) insufficiency: Secondary | ICD-10-CM | POA: Diagnosis not present

## 2020-08-04 DIAGNOSIS — U071 COVID-19: Secondary | ICD-10-CM

## 2020-08-04 DIAGNOSIS — I5023 Acute on chronic systolic (congestive) heart failure: Secondary | ICD-10-CM

## 2020-08-04 DIAGNOSIS — I2510 Atherosclerotic heart disease of native coronary artery without angina pectoris: Secondary | ICD-10-CM

## 2020-08-04 DIAGNOSIS — I482 Chronic atrial fibrillation, unspecified: Secondary | ICD-10-CM

## 2020-08-04 LAB — ECHOCARDIOGRAM COMPLETE
AR max vel: 1.31 cm2
AV Area VTI: 1.34 cm2
AV Area mean vel: 1.26 cm2
AV Mean grad: 21.4 mmHg
AV Peak grad: 39.6 mmHg
Ao pk vel: 3.15 m/s
Area-P 1/2: 3.12 cm2
Height: 66 in
MV M vel: 5.34 m/s
MV Peak grad: 114.1 mmHg
P 1/2 time: 263 msec
S' Lateral: 2.33 cm
Weight: 2236.35 oz

## 2020-08-04 LAB — C-REACTIVE PROTEIN: CRP: 0.8 mg/dL (ref <1.0)

## 2020-08-04 LAB — MAGNESIUM: Magnesium: 1.4 mg/dL — ABNORMAL LOW (ref 1.7–2.4)

## 2020-08-04 LAB — COMPREHENSIVE METABOLIC PANEL
ALT: 15 U/L (ref 0–44)
AST: 26 U/L (ref 15–41)
Albumin: 2.9 g/dL — ABNORMAL LOW (ref 3.5–5.0)
Alkaline Phosphatase: 86 U/L (ref 38–126)
Anion gap: 10 (ref 5–15)
BUN: 23 mg/dL (ref 8–23)
CO2: 24 mmol/L (ref 22–32)
Calcium: 8.5 mg/dL — ABNORMAL LOW (ref 8.9–10.3)
Chloride: 104 mmol/L (ref 98–111)
Creatinine, Ser: 1.04 mg/dL — ABNORMAL HIGH (ref 0.44–1.00)
GFR, Estimated: 51 mL/min — ABNORMAL LOW (ref >60)
Glucose, Bld: 120 mg/dL — ABNORMAL HIGH (ref 70–99)
Potassium: 3.6 mmol/L (ref 3.5–5.1)
Sodium: 138 mmol/L (ref 135–145)
Total Bilirubin: 0.8 mg/dL (ref 0.3–1.2)
Total Protein: 6.3 g/dL — ABNORMAL LOW (ref 6.5–8.1)

## 2020-08-04 LAB — D-DIMER, QUANTITATIVE: D-Dimer, Quant: 1.43 ug/mL-FEU — ABNORMAL HIGH (ref 0.00–0.50)

## 2020-08-04 LAB — PHOSPHORUS: Phosphorus: 3.8 mg/dL (ref 2.5–4.6)

## 2020-08-04 LAB — FERRITIN: Ferritin: 75 ng/mL (ref 11–307)

## 2020-08-04 MED ORDER — ISOSORBIDE DINITRATE 5 MG PO TABS
5.0000 mg | ORAL_TABLET | Freq: Two times a day (BID) | ORAL | Status: DC
  Administered 2020-08-04 – 2020-08-07 (×7): 5 mg via ORAL
  Filled 2020-08-04 (×11): qty 1

## 2020-08-04 MED ORDER — CLOPIDOGREL BISULFATE 75 MG PO TABS
75.0000 mg | ORAL_TABLET | Freq: Every day | ORAL | Status: DC
  Administered 2020-08-04 – 2020-08-07 (×4): 75 mg via ORAL
  Filled 2020-08-04 (×4): qty 1

## 2020-08-04 MED ORDER — ALBUTEROL SULFATE HFA 108 (90 BASE) MCG/ACT IN AERS: 2.0000 | INHALATION_SPRAY | Freq: Four times a day (QID) | RESPIRATORY_TRACT | Status: DC | PRN

## 2020-08-04 MED ORDER — HYDRALAZINE HCL 25 MG PO TABS
50.0000 mg | ORAL_TABLET | Freq: Three times a day (TID) | ORAL | Status: DC
  Administered 2020-08-04 – 2020-08-07 (×11): 50 mg via ORAL
  Filled 2020-08-04 (×10): qty 2

## 2020-08-04 MED ORDER — SPIRONOLACTONE 25 MG PO TABS
25.0000 mg | ORAL_TABLET | Freq: Every day | ORAL | Status: DC
  Administered 2020-08-04 – 2020-08-07 (×4): 25 mg via ORAL
  Filled 2020-08-04 (×4): qty 1

## 2020-08-04 NOTE — Progress Notes (Signed)
*  PRELIMINARY RESULTS* Echocardiogram 2D Echocardiogram has been performed.  Leavy Cella 08/04/2020, 10:19 AM

## 2020-08-04 NOTE — Progress Notes (Signed)
PROGRESS NOTE  Julia Hart WCB:[id number] DOB: [birth year] DOA: 08/03/2020 PCP: [name], MD  Brief History:  86 year old female with a history of coronary artery disease/NSTEMI, hypertension, hyperlipidemia, ischemic cardiomyopathy with previous EF 35% presenting with 1 week history of shortness of breath and worsening lower extremity edema.  The patient states that she has gained weight from 120 pounds to 140 pounds, but cannot tell me the duration of which this occurred.  She denies any fevers, chills, headache, sore throat, hemoptysis, nausea, vomiting, diarrhea, abdominal pain.  Normally, the patient takes the torsemide 20 mg every other day.  Since her worsening shortness of breath, the patient has been taking her torsemide on a daily basis for the last 5 days prior to admission.  She states that she had recently followed up with her cardiologist, Dr. Orpah Greek in [place] on July 20, 2020.  No changes were made at that time with her heart regimen. Notably, the patient had a recent hospitalization in [place] November 2021 where she had a hemoglobin of 4.9 and was transfused 4 units PRBC.  EGD on 06/26/2020 showed a small hiatus hernia, normal esophagus.  She was recently restarted on apixaban on 07/20/2020.  In the emergency department, the patient was afebrile and hemodynamically stable with oxygen saturation 92% on room air.  The patient was started on IV furosemide.  She was also noted to have COVID-positive RT-PCR.  She was started on remdesivir and steroids.  Assessment/Plan: Acute on chronic systolic CHF -01/05/736 echo EF 50% -Previously had echo with a EF of 35% -Continue IV furosemide -Daily weights -Accurate I's and O's -Continue Entresto, isosorbide, hydralazine -Continue spironolactone  Coronary artery disease  -history of NSTEMI and PCI of the ostial and proximal RCA 08/27/2017 -No chest pain presently -08/27/2017 cath--RCA ostial 80% calcified  stenosis; occluded SVG jump graft; first OM 70% stenosis; 90% circumflex stenosis -Continue Plavix and apixaban  COVID-19 positive -She has triple vaccinated -Clinically stable presently -Check inflammatory markers -stable on RA -continue remdesivir  Essential hypertension -Continue hydralazine and isosorbide -Holding amlodipine to allow margin for diuresis  Hyperlipidemia -Continue statin  GERD -Continue Protonix   Chronic atrial fibrillation -Rate controlled -Continue apixaban       Status is: Inpatient  Remains inpatient appropriate because:IV treatments appropriate due to intensity of illness or inability to take PO   Dispo: The patient is from: Home              Anticipated d/c is to: Home              Anticipated d/c date is: 2 days              Patient currently is not medically stable to d/c.        Family Communication:  no Family at bedside  Consultants:  none  Code Status:   DNI  DVT Prophylaxis: apixaban   Procedures: As Listed in Progress Note Above  Antibiotics: None       Subjective: She still complains of lower extremity edema and some dyspnea on exertion.  She denies any headache, sore throat, chest pain, nausea, vomiting without abdominal pain.  There is no dysuria or hematuria.  Objective: Vitals:   08/03/20 2226 08/03/20 2226 08/04/20 0039 08/04/20 0527  BP:  (!) 167/58 (!) 150/59 (!) 154/77  Pulse:  79 70 76  Resp:  19 18 17   Temp:  98.1 F (36.7 C) 98.2 F (36.8  C) (!) 97.5 F (36.4 C)  TempSrc:      SpO2:  97% 98% 92%  Weight: 63.4 kg     Height: 5\' 6"  (1.676 m)       Intake/Output Summary (Last 24 hours) at 08/04/2020 0830 Last data filed at 08/04/2020 0300 Gross per 24 hour  Intake 200 ml  Output --  Net 200 ml   Weight change:  Exam:   General:  Pt is alert, follows commands appropriately, not in acute distress  HEENT: No icterus, No thrush, No neck mass, /AT  Cardiovascular: RRR, S1/S2, no  rubs, no gallops  Respiratory: Bibasilar crackles but no wheezing.  Good air movement  Abdomen: Soft/+BS, non tender, non distended, no guarding  Extremities: 2+LE edema, No lymphangitis, No petechiae, No rashes, no synovitis   Data Reviewed: I have personally reviewed following labs and imaging studies Basic Metabolic Panel: Recent Labs  Lab 08/03/20 1837 08/04/20 0459  NA 133* 138  K 3.5 3.6  CL 101 104  CO2 25 24  GLUCOSE 121* 120*  BUN 26* 23  CREATININE 1.21* 1.04*  CALCIUM 8.2* 8.5*  MG  --  1.4*  PHOS  --  3.8   Liver Function Tests: Recent Labs  Lab 08/03/20 1837 08/04/20 0459  AST 30 26  ALT 18 15  ALKPHOS 92 86  BILITOT 0.5 0.8  PROT 6.5 6.3*  ALBUMIN 3.1* 2.9*   No results for input(s): LIPASE, AMYLASE in the last 168 hours. No results for input(s): AMMONIA in the last 168 hours. Coagulation Profile: No results for input(s): INR, PROTIME in the last 168 hours. CBC: Recent Labs  Lab 08/03/20 1837  WBC 4.6  NEUTROABS 2.8  HGB 9.2*  HCT 28.5*  MCV 90.2  PLT 180   Cardiac Enzymes: No results for input(s): CKTOTAL, CKMB, CKMBINDEX, TROPONINI in the last 168 hours. BNP: Invalid input(s): POCBNP CBG: No results for input(s): GLUCAP in the last 168 hours. HbA1C: No results for input(s): HGBA1C in the last 72 hours. Urine analysis: No results found for: COLORURINE, APPEARANCEUR, LABSPEC, PHURINE, GLUCOSEU, HGBUR, BILIRUBINUR, KETONESUR, PROTEINUR, UROBILINOGEN, NITRITE, LEUKOCYTESUR Sepsis Labs: @LABRCNTIP (procalcitonin:4,lacticidven:4) ) Recent Results (from the past 240 hour(s))  Resp Panel by RT-PCR (Flu A&B, Covid) Nasopharyngeal Swab     Status: Abnormal   Collection Time: 08/03/20  6:38 PM   Specimen: Nasopharyngeal Swab; Nasopharyngeal(NP) swabs in vial transport medium  Result Value Ref Range Status   SARS Coronavirus 2 by RT PCR POSITIVE (A) NEGATIVE Final    Comment: RESULT CALLED TO, READ BACK BY AND VERIFIED WITH: C  TURNER,RN@1944  08/03/20 MKELLY (NOTE) SARS-CoV-2 target nucleic acids are DETECTED.  The SARS-CoV-2 RNA is generally detectable in upper respiratory specimens during the acute phase of infection. Positive results are indicative of the presence of the identified virus, but do not rule out bacterial infection or co-infection with other pathogens not detected by the test. Clinical correlation with patient history and other diagnostic information is necessary to determine patient infection status. The expected result is Negative.  Fact Sheet for Patients: EntrepreneurPulse.com.au  Fact Sheet for Healthcare Providers: IncredibleEmployment.be  This test is not yet approved or cleared by the [place] FDA and  has been authorized for detection and/or diagnosis of SARS-CoV-2 by FDA under an Emergency Use Authorization (EUA).  This EUA will remain in effect (meaning this test can be  used) for the duration of  the COVID-19 declaration under Section 564(b)(1) of the Act, 21 U.S.C. section 360bbb-3(b)(1), unless the authorization  is terminated or revoked sooner.     Influenza A by PCR NEGATIVE NEGATIVE Final   Influenza B by PCR NEGATIVE NEGATIVE Final    Comment: (NOTE) The Xpert Xpress SARS-CoV-2/FLU/RSV plus assay is intended as an aid in the diagnosis of influenza from Nasopharyngeal swab specimens and should not be used as a sole basis for treatment. Nasal washings and aspirates are unacceptable for Xpert Xpress SARS-CoV-2/FLU/RSV testing.  Fact Sheet for Patients: EntrepreneurPulse.com.au  Fact Sheet for Healthcare Providers: IncredibleEmployment.be  This test is not yet approved or cleared by the [place] FDA and has been authorized for detection and/or diagnosis of SARS-CoV-2 by FDA under an Emergency Use Authorization (EUA). This EUA will remain in effect (meaning this test can be used) for the  duration of the COVID-19 declaration under Section 564(b)(1) of the Act, 21 U.S.C. section 360bbb-3(b)(1), unless the authorization is terminated or revoked.  Performed at [facility], [address], [place], [place] [address]      Scheduled Meds: . albuterol  2 puff Inhalation Q6H  . apixaban  2.5 mg Oral BID  . vitamin C  500 mg Oral Daily  . atorvastatin  20 mg Oral QHS  . dexamethasone (DECADRON) injection  6 mg Intravenous Q24H  . furosemide  40 mg Intravenous Q12H  . zinc sulfate  220 mg Oral Daily   Continuous Infusions: . remdesivir 100 mg in NS 100 mL      Procedures/Studies: DG Chest Port 1 View  Result Date: 08/03/2020 CLINICAL DATA:  87 year old female with shortness of breath. EXAM: PORTABLE CHEST 1 VIEW COMPARISON:  None. FINDINGS: There is cardiomegaly with vascular congestion and mild edema. Atypical pneumonia is not excluded clinical correlation is recommended. Probable trace bilateral pleural effusion. No pneumothorax. Median sternotomy wires and CABG vascular clips. Atherosclerotic calcification the aorta. No acute osseous pathology. IMPRESSION: Cardiomegaly with findings of CHF. Atypical pneumonia is not excluded. Electronically Signed   By: Anner Crete M.D.   On: 08/03/2020 19:17    [name], DO  [facility]  If 7PM-7AM, please contact night-coverage [email or web address] Password [email or web address] 08/04/2020, 8:30 AM   LOS: 1 day

## 2020-08-04 NOTE — Telephone Encounter (Signed)
Noted, thanks!

## 2020-08-04 NOTE — Telephone Encounter (Signed)
noted 

## 2020-08-04 NOTE — Care Management Important Message (Signed)
Important Message  Patient Details  Name: Julia Hart MRN: [id number] Date of Birth: 03-08-1927   Medicare Important Message Given:  Yes - Important Message mailed due to current National Emergency     Tommy Medal 08/04/2020, 11:15 AM

## 2020-08-05 LAB — COMPREHENSIVE METABOLIC PANEL
ALT: 13 U/L (ref 0–44)
AST: 24 U/L (ref 15–41)
Albumin: 2.4 g/dL — ABNORMAL LOW (ref 3.5–5.0)
Alkaline Phosphatase: 71 U/L (ref 38–126)
Anion gap: 7 (ref 5–15)
BUN: 26 mg/dL — ABNORMAL HIGH (ref 8–23)
CO2: 27 mmol/L (ref 22–32)
Calcium: 8.2 mg/dL — ABNORMAL LOW (ref 8.9–10.3)
Chloride: 103 mmol/L (ref 98–111)
Creatinine, Ser: 1.12 mg/dL — ABNORMAL HIGH (ref 0.44–1.00)
GFR, Estimated: 46 mL/min — ABNORMAL LOW (ref >60)
Glucose, Bld: 83 mg/dL (ref 70–99)
Potassium: 3.3 mmol/L — ABNORMAL LOW (ref 3.5–5.1)
Sodium: 137 mmol/L (ref 135–145)
Total Bilirubin: 0.6 mg/dL (ref 0.3–1.2)
Total Protein: 5.5 g/dL — ABNORMAL LOW (ref 6.5–8.1)

## 2020-08-05 LAB — PHOSPHORUS: Phosphorus: 3.6 mg/dL (ref 2.5–4.6)

## 2020-08-05 LAB — C-REACTIVE PROTEIN: CRP: 0.7 mg/dL (ref <1.0)

## 2020-08-05 LAB — FERRITIN: Ferritin: 65 ng/mL (ref 11–307)

## 2020-08-05 LAB — MAGNESIUM: Magnesium: 1.3 mg/dL — ABNORMAL LOW (ref 1.7–2.4)

## 2020-08-05 LAB — D-DIMER, QUANTITATIVE: D-Dimer, Quant: 1.04 ug/mL-FEU — ABNORMAL HIGH (ref 0.00–0.50)

## 2020-08-05 MED ORDER — MAGNESIUM SULFATE 2 GM/50ML IV SOLN
2.0000 g | Freq: Once | INTRAVENOUS | Status: AC
  Administered 2020-08-05: 2 g via INTRAVENOUS
  Filled 2020-08-05: qty 50

## 2020-08-05 MED ORDER — POTASSIUM CHLORIDE CRYS ER 20 MEQ PO TBCR
40.0000 meq | EXTENDED_RELEASE_TABLET | Freq: Once | ORAL | Status: AC
  Administered 2020-08-05: 40 meq via ORAL
  Filled 2020-08-05: qty 2

## 2020-08-05 MED ORDER — MAGNESIUM SULFATE 2 GM/50ML IV SOLN: 2.0000 g | Freq: Once | INTRAVENOUS | Status: DC

## 2020-08-05 MED ORDER — PREDNISOLONE ACETATE 1 % OP SUSP
1.0000 [drp] | OPHTHALMIC | Status: DC
  Administered 2020-08-05 – 2020-08-07 (×2): 1 [drp] via OPHTHALMIC
  Filled 2020-08-05: qty 1

## 2020-08-05 NOTE — Progress Notes (Signed)
PROGRESS NOTE  Julia Hart [id number] DOB: 1926/09/09 DOA: 08/03/2020 PCP: [name], MD   Brief History:  87 year old female with a history of coronary artery disease/NSTEMI, hypertension, hyperlipidemia, ischemic cardiomyopathy with previous EF 35% presenting with 1 week history of shortness of breath and worsening lower extremity edema.  The patient states that she has gained weight from 120 pounds to 140 pounds, but cannot tell me the duration of which this occurred.  She denies any fevers, chills, headache, sore throat, hemoptysis, nausea, vomiting, diarrhea, abdominal pain.  Normally, the patient takes the torsemide 20 mg every other day.  Since her worsening shortness of breath, the patient has been taking her torsemide on a daily basis for the last 5 days prior to admission.  She states that she had recently followed up with her cardiologist, Dr. Orpah Greek in [facility] on July 20, 2020.  No changes were made at that time with her heart regimen. Notably, the patient had a recent hospitalization in [place] November 2021 where she had a hemoglobin of 4.9 and was transfused 4 units PRBC.  EGD on 06/26/2020 showed a small hiatus hernia, normal esophagus.  She was recently restarted on apixaban on 07/20/2020.  In the emergency department, the patient was afebrile and hemodynamically stable with oxygen saturation 92% on room air.  The patient was started on IV furosemide.  She was also noted to have COVID-positive RT-PCR.  She was started on remdesivir and steroids.  Assessment/Plan: Acute on chronic systolic CHF -99991111 echo EF 50% -08/05/20 Echo 65-70%, no WMA, mod enlarge RV, mod TR; small pericardial effusion -Previously had echo with a EF of 35% -remains clinically fluid overloaded -Continue IV furosemide -Daily weights--132.3 lbs (1/15) -Accurate I's and O's -Continue isosorbide, hydralazine -Continue spironolactone -Holding Entresto  temporarily  Coronary artery disease  -history of NSTEMI and PCI of the ostial and proximal RCA 08/27/2017 -No chest pain presently -08/27/2017 cath--RCA ostial 80% calcified stenosis; occluded SVG jump graft; first OM 70% stenosis; 90% circumflex stenosis -Continue Plavix and apixaban  COVID-19 positive -She has been triple vaccinated -Clinically stable presently -D-dimer-1.57>>1.43>>1.04 -CRP 0.7>>0.8>>0.9 -ferritin 72>>75>>65 -stable on RA -continue remdesivir  Essential hypertension -Continue hydralazine and isosorbide -Holding amlodipine to allow margin for diuresis  Hyperlipidemia -Continue statin  GERD -Continue Protonix   Chronic atrial fibrillation -Rate controlled -Continue apixaban       Status is: Inpatient  Remains inpatient appropriate because:IV treatments appropriate due to intensity of illness or inability to take PO   Dispo: The patient is from: Home  Anticipated d/c is to: Home  Anticipated d/c date is: 2 days  Patient currently is not medically stable to d/c.        Family Communication:  no Family at bedside  Consultants:  none  Code Status:   DNI  DVT Prophylaxis: apixaban   Procedures: As Listed in Progress Note Above  Antibiotics: None  Subjective: Patient still has some dyspnea on exertion.  Denies f/c, cp, n/v/d, abd pain.  Objective: Vitals:   08/04/20 1430 08/04/20 2149 08/05/20 0518 08/05/20 1349  BP: (!) 143/46 (!) 137/52 (!) 127/43 (!) 140/52  Pulse: 73 72 67 69  Resp: 18 18 18 17   Temp: 98.1 F (36.7 C) 97.9 F (36.6 C) 97.8 F (36.6 C) 98.2 F (36.8 C)  TempSrc: Oral Oral Oral Oral  SpO2: 100% 100% 97% 99%  Weight:      Height:  Intake/Output Summary (Last 24 hours) at 08/05/2020 1444 Last data filed at 08/05/2020 1300 Gross per 24 hour  Intake 580 ml  Output 900 ml  Net -320 ml   Weight change:  Exam:   General:  Pt is  alert, follows commands appropriately, not in acute distress  HEENT: No icterus, No thrush, No neck mass, [place]/AT  Cardiovascular: IRRR, S1/S2, no rubs, no gallops  Respiratory: bibasilar crackles. No wheeze  Abdomen: Soft/+BS, non tender, non distended, no guarding  Extremities: 1+ LE edema, No lymphangitis, No petechiae, No rashes, no synovitis   Data Reviewed: I have personally reviewed following labs and imaging studies Basic Metabolic Panel: Recent Labs  Lab 08/03/20 1837 08/04/20 0459 08/05/20 0644  NA 133* 138 137  K 3.5 3.6 3.3*  CL 101 104 103  CO2 25 24 27   GLUCOSE 121* 120* 83  BUN 26* 23 26*  CREATININE 1.21* 1.04* 1.12*  CALCIUM 8.2* 8.5* 8.2*  MG  --  1.4* 1.3*  PHOS  --  3.8 3.6   Liver Function Tests: Recent Labs  Lab 08/03/20 1837 08/04/20 0459 08/05/20 0644  AST 30 26 24   ALT 18 15 13   ALKPHOS 92 86 71  BILITOT 0.5 0.8 0.6  PROT 6.5 6.3* 5.5*  ALBUMIN 3.1* 2.9* 2.4*   No results for input(s): LIPASE, AMYLASE in the last 168 hours. No results for input(s): AMMONIA in the last 168 hours. Coagulation Profile: No results for input(s): INR, PROTIME in the last 168 hours. CBC: Recent Labs  Lab 08/03/20 1837  WBC 4.6  NEUTROABS 2.8  HGB 9.2*  HCT 28.5*  MCV 90.2  PLT 180   Cardiac Enzymes: No results for input(s): CKTOTAL, CKMB, CKMBINDEX, TROPONINI in the last 168 hours. BNP: Invalid input(s): POCBNP CBG: No results for input(s): GLUCAP in the last 168 hours. HbA1C: No results for input(s): HGBA1C in the last 72 hours. Urine analysis: No results found for: COLORURINE, APPEARANCEUR, LABSPEC, PHURINE, GLUCOSEU, HGBUR, BILIRUBINUR, KETONESUR, PROTEINUR, UROBILINOGEN, NITRITE, LEUKOCYTESUR Sepsis Labs: @LABRCNTIP (procalcitonin:4,lacticidven:4) ) Recent Results (from the past 240 hour(s))  Resp Panel by RT-PCR (Flu A&B, Covid) Nasopharyngeal Swab     Status: Abnormal   Collection Time: 08/03/20  6:38 PM   Specimen: Nasopharyngeal Swab;  Nasopharyngeal(NP) swabs in vial transport medium  Result Value Ref Range Status   SARS Coronavirus 2 by RT PCR POSITIVE (A) NEGATIVE Final    Comment: RESULT CALLED TO, READ BACK BY AND VERIFIED WITH: C TURNER,RN@1944  08/03/20 MKELLY (NOTE) SARS-CoV-2 target nucleic acids are DETECTED.  The SARS-CoV-2 RNA is generally detectable in upper respiratory specimens during the acute phase of infection. Positive results are indicative of the presence of the identified virus, but do not rule out bacterial infection or co-infection with other pathogens not detected by the test. Clinical correlation with patient history and other diagnostic information is necessary to determine patient infection status. The expected result is Negative.  Fact Sheet for Patients: EntrepreneurPulse.com.au  Fact Sheet for Healthcare Providers: IncredibleEmployment.be  This test is not yet approved or cleared by the [place] FDA and  has been authorized for detection and/or diagnosis of SARS-CoV-2 by FDA under an Emergency Use Authorization (EUA).  This EUA will remain in effect (meaning this test can be  used) for the duration of  the COVID-19 declaration under Section 564(b)(1) of the Act, 21 U.S.C. section 360bbb-3(b)(1), unless the authorization is terminated or revoked sooner.     Influenza A by PCR NEGATIVE NEGATIVE Final   Influenza B  by PCR NEGATIVE NEGATIVE Final    Comment: (NOTE) The Xpert Xpress SARS-CoV-2/FLU/RSV plus assay is intended as an aid in the diagnosis of influenza from Nasopharyngeal swab specimens and should not be used as a sole basis for treatment. Nasal washings and aspirates are unacceptable for Xpert Xpress SARS-CoV-2/FLU/RSV testing.  Fact Sheet for Patients: EntrepreneurPulse.com.au  Fact Sheet for Healthcare Providers: IncredibleEmployment.be  This test is not yet approved or cleared by the  [place] FDA and has been authorized for detection and/or diagnosis of SARS-CoV-2 by FDA under an Emergency Use Authorization (EUA). This EUA will remain in effect (meaning this test can be used) for the duration of the COVID-19 declaration under Section 564(b)(1) of the Act, 21 U.S.C. section 360bbb-3(b)(1), unless the authorization is terminated or revoked.  Performed at [facility], [address], [place], [place] [address]      Scheduled Meds: . apixaban  2.5 mg Oral BID  . vitamin C  500 mg Oral Daily  . atorvastatin  20 mg Oral QHS  . clopidogrel  75 mg Oral Daily  . furosemide  40 mg Intravenous Q12H  . hydrALAZINE  50 mg Oral Q8H  . isosorbide dinitrate  5 mg Oral BID  . prednisoLONE acetate  1 drop Both Eyes QODAY  . spironolactone  25 mg Oral Daily  . zinc sulfate  220 mg Oral Daily   Continuous Infusions: . remdesivir 100 mg in NS 100 mL 100 mg (08/05/20 0912)    Procedures/Studies: DG Chest Port 1 View  Result Date: 08/03/2020 CLINICAL DATA:  87 year old female with shortness of breath. EXAM: PORTABLE CHEST 1 VIEW COMPARISON:  None. FINDINGS: There is cardiomegaly with vascular congestion and mild edema. Atypical pneumonia is not excluded clinical correlation is recommended. Probable trace bilateral pleural effusion. No pneumothorax. Median sternotomy wires and CABG vascular clips. Atherosclerotic calcification the aorta. No acute osseous pathology. IMPRESSION: Cardiomegaly with findings of CHF. Atypical pneumonia is not excluded. Electronically Signed   By: Anner Crete M.D.   On: 08/03/2020 19:17   ECHOCARDIOGRAM COMPLETE  Result Date: 08/04/2020    ECHOCARDIOGRAM REPORT   Patient Name:   DAYRIN SCOFIELD Date of Exam: 08/04/2020 Medical Rec #:  [id number]     Height:       66.0 in Accession #:    [id number]    Weight:       139.8 lb Date of Birth:  02/13/1927     BSA:          1.717 m Patient Age:    32 years      BP:           154/77 mmHg Patient Gender: F              HR:           76 bpm. Exam Location:  [facility] Procedure: 2D Echo Indications:    Cardiomyopathy-Ischemic I25.5  History:        Patient has no prior history of Echocardiogram examinations.                 CHF, CAD, Arrythmias:Atrial Fibrillation; Risk                 Factors:Hypertension. COVID-19 virus infection.  Sonographer:    Leavy Cella RDCS (AE) Referring Phys: [place]  1. Left ventricular ejection fraction, by estimation, is 65 to 70%. The left ventricle has normal function. The left ventricle has no regional wall motion abnormalities.  There is severe left ventricular hypertrophy. Left ventricular diastolic parameters  are consistent with Grade II diastolic dysfunction (pseudonormalization). Elevated left atrial pressure.  2. Right ventricular systolic function is mildly reduced. The right ventricular size is moderately enlarged.  3. Left atrial size was severely dilated.  4. Right atrial size was moderately dilated.  5. A small pericardial effusion is present. The pericardial effusion is circumferential.  6. The mitral valve is abnormal. Mild mitral valve regurgitation. No evidence of mitral stenosis. Moderate mitral annular calcification.  7. Tricuspid valve regurgitation is moderate.  8. The aortic valve is tricuspid. There is severe calcifcation of the aortic valve. There is severe thickening of the aortic valve. Aortic valve regurgitation is moderate. Moderate aortic valve stenosis.  9. Moderate pulmonary HTN, PASP is 55 mmHg. 10. The inferior vena cava is dilated in size with <50% respiratory variability, suggesting right atrial pressure of 15 mmHg. FINDINGS  Left Ventricle: Left ventricular ejection fraction, by estimation, is 65 to 70%. The left ventricle has normal function. The left ventricle has no regional wall motion abnormalities. The left ventricular internal cavity size was normal in size. There is  severe left ventricular hypertrophy. Left  ventricular diastolic parameters are consistent with Grade II diastolic dysfunction (pseudonormalization). Elevated left atrial pressure. Right Ventricle: The right ventricular size is moderately enlarged. Right vetricular wall thickness was not assessed. Right ventricular systolic function is mildly reduced. Left Atrium: Left atrial size was severely dilated. Right Atrium: Right atrial size was moderately dilated. Pericardium: A small pericardial effusion is present. The pericardial effusion is circumferential. Mitral Valve: The mitral valve is abnormal. There is moderate thickening of the mitral valve leaflet(s). There is moderate calcification of the mitral valve leaflet(s). Moderate mitral annular calcification. Mild mitral valve regurgitation. No evidence of mitral valve stenosis. Tricuspid Valve: The tricuspid valve is normal in structure. Tricuspid valve regurgitation is moderate . No evidence of tricuspid stenosis. Aortic Valve: The aortic valve is tricuspid. There is severe calcifcation of the aortic valve. There is severe thickening of the aortic valve. There is severe aortic valve annular calcification. Aortic valve regurgitation is moderate. Aortic regurgitation PHT measures 263 msec. Moderate aortic stenosis is present. Aortic valve mean gradient measures 21.4 mmHg. Aortic valve peak gradient measures 39.6 mmHg. Aortic valve area, by VTI measures 1.34 cm. Pulmonic Valve: The pulmonic valve was not well visualized. Pulmonic valve regurgitation is not visualized. No evidence of pulmonic stenosis. Aorta: The aortic root is normal in size and structure. Pulmonary Artery: Moderate pulmonary HTN, PASP is 55 mmHg. Venous: The inferior vena cava is dilated in size with less than 50% respiratory variability, suggesting right atrial pressure of 15 mmHg. IAS/Shunts: No atrial level shunt detected by color flow Doppler.  LEFT VENTRICLE PLAX 2D LVIDd:         3.71 cm  Diastology LVIDs:         2.33 cm  LV e'  medial:    5.22 cm/s LV PW:         1.85 cm  LV E/e' medial:  19.1 LV IVS:        2.02 cm  LV e' lateral:   6.31 cm/s LVOT diam:     2.00 cm  LV E/e' lateral: 15.8 LV SV:         96 LV SV Index:   56 LVOT Area:     3.14 cm  RIGHT VENTRICLE RV S prime:     9.46 cm/s TAPSE (M-mode): 1.2 cm LEFT ATRIUM  Index       RIGHT ATRIUM           Index LA diam:        3.90 cm 2.27 cm/m  RA Area:     20.70 cm LA Vol (A2C):   90.5 ml 52.70 ml/m RA Volume:   59.30 ml  34.53 ml/m LA Vol (A4C):   66.2 ml 38.55 ml/m LA Biplane Vol: 77.8 ml 45.30 ml/m  AORTIC VALVE AV Area (Vmax):    1.31 cm AV Area (Vmean):   1.26 cm AV Area (VTI):     1.34 cm AV Vmax:           314.71 cm/s AV Vmean:          214.903 cm/s AV VTI:            0.717 m AV Peak Grad:      39.6 mmHg AV Mean Grad:      21.4 mmHg LVOT Vmax:         130.99 cm/s LVOT Vmean:        86.254 cm/s LVOT VTI:          0.305 m LVOT/AV VTI ratio: 0.43 AI PHT:            263 msec  AORTA Ao Root diam: 2.60 cm MITRAL VALVE               TRICUSPID VALVE MV Area (PHT): 3.12 cm    TR Peak grad:   40.7 mmHg MV Decel Time: 243 msec    TR Vmax:        319.00 cm/s MR Peak grad: 114.1 mmHg MR Mean grad: 76.0 mmHg    SHUNTS MR Vmax:      534.00 cm/s  Systemic VTI:  0.30 m MR Vmean:     404.0 cm/s   Systemic Diam: 2.00 cm MV E velocity: 99.50 cm/s MV A velocity: 57.40 cm/s MV E/A ratio:  1.73 Carlyle Dolly MD Electronically signed by Carlyle Dolly MD Signature Date/Time: 08/04/2020/11:29:14 AM    Final     [name], DO  [facility]  If 7PM-7AM, please contact night-coverage [email or web address] Password [email or web address] 08/05/2020, 2:44 PM   LOS: 2 days

## 2020-08-05 NOTE — Plan of Care (Signed)
  Problem: Education: Goal: Knowledge of General Education information will improve Description: Including pain rating scale, medication(s)/side effects and non-pharmacologic comfort measures Outcome: Progressing   Problem: Health Behavior/Discharge Planning: Goal: Ability to manage health-related needs will improve Outcome: Progressing   Problem: Clinical Measurements: Goal: Ability to maintain clinical measurements within normal limits will improve Outcome: Progressing Goal: Will remain free from infection Outcome: Progressing Goal: Diagnostic test results will improve Outcome: Progressing Goal: Respiratory complications will improve Outcome: Progressing Goal: Cardiovascular complication will be avoided Outcome: Progressing   Problem: Activity: Goal: Risk for activity intolerance will decrease Outcome: Progressing   Problem: Nutrition: Goal: Adequate nutrition will be maintained Outcome: Progressing   Problem: Coping: Goal: Level of anxiety will decrease Outcome: Progressing   Problem: Elimination: Goal: Will not experience complications related to bowel motility Outcome: Progressing Goal: Will not experience complications related to urinary retention Outcome: Progressing   Problem: Pain Managment: Goal: General experience of comfort will improve Outcome: Progressing   Problem: Safety: Goal: Ability to remain free from injury will improve Outcome: Progressing   Problem: Skin Integrity: Goal: Risk for impaired skin integrity will decrease Outcome: Progressing   Problem: Education: Goal: Knowledge of risk factors and measures for prevention of condition will improve Outcome: Progressing   Problem: Coping: Goal: Psychosocial and spiritual needs will be supported Outcome: Progressing   Problem: Respiratory: Goal: Will maintain a patent airway Outcome: Progressing Goal: Complications related to the disease process, condition or treatment will be avoided or  minimized Outcome: Progressing   Problem: Education: Goal: Ability to demonstrate management of disease process will improve Outcome: Progressing Goal: Ability to verbalize understanding of medication therapies will improve Outcome: Progressing Goal: Individualized Educational Video(s) Outcome: Progressing   Problem: Activity: Goal: Capacity to carry out activities will improve Outcome: Progressing   Problem: Cardiac: Goal: Ability to achieve and maintain adequate cardiopulmonary perfusion will improve Outcome: Progressing   

## 2020-08-06 LAB — COMPREHENSIVE METABOLIC PANEL
ALT: 15 U/L (ref 0–44)
AST: 25 U/L (ref 15–41)
Albumin: 2.7 g/dL — ABNORMAL LOW (ref 3.5–5.0)
Alkaline Phosphatase: 75 U/L (ref 38–126)
Anion gap: 9 (ref 5–15)
BUN: 28 mg/dL — ABNORMAL HIGH (ref 8–23)
CO2: 30 mmol/L (ref 22–32)
Calcium: 8.7 mg/dL — ABNORMAL LOW (ref 8.9–10.3)
Chloride: 96 mmol/L — ABNORMAL LOW (ref 98–111)
Creatinine, Ser: 1.14 mg/dL — ABNORMAL HIGH (ref 0.44–1.00)
GFR, Estimated: 45 mL/min — ABNORMAL LOW (ref >60)
Glucose, Bld: 77 mg/dL (ref 70–99)
Potassium: 4 mmol/L (ref 3.5–5.1)
Sodium: 135 mmol/L (ref 135–145)
Total Bilirubin: 0.7 mg/dL (ref 0.3–1.2)
Total Protein: 5.7 g/dL — ABNORMAL LOW (ref 6.5–8.1)

## 2020-08-06 LAB — PHOSPHORUS: Phosphorus: 3.7 mg/dL (ref 2.5–4.6)

## 2020-08-06 LAB — C-REACTIVE PROTEIN: CRP: 0.8 mg/dL (ref <1.0)

## 2020-08-06 LAB — MAGNESIUM: Magnesium: 1.5 mg/dL — ABNORMAL LOW (ref 1.7–2.4)

## 2020-08-06 LAB — D-DIMER, QUANTITATIVE: D-Dimer, Quant: 1.17 ug/mL-FEU — ABNORMAL HIGH (ref 0.00–0.50)

## 2020-08-06 LAB — FERRITIN: Ferritin: 74 ng/mL (ref 11–307)

## 2020-08-06 MED ORDER — FUROSEMIDE 40 MG PO TABS
40.0000 mg | ORAL_TABLET | Freq: Every day | ORAL | Status: DC
  Administered 2020-08-07: 40 mg via ORAL
  Filled 2020-08-06: qty 1

## 2020-08-06 MED ORDER — POLYETHYLENE GLYCOL 3350 17 G PO PACK
17.0000 g | PACK | Freq: Every day | ORAL | Status: DC
  Administered 2020-08-06 – 2020-08-07 (×2): 17 g via ORAL
  Filled 2020-08-06 (×2): qty 1

## 2020-08-06 MED ORDER — BISACODYL 5 MG PO TBEC
5.0000 mg | DELAYED_RELEASE_TABLET | Freq: Once | ORAL | Status: AC
  Administered 2020-08-06: 5 mg via ORAL
  Filled 2020-08-06: qty 1

## 2020-08-06 MED ORDER — MAGNESIUM SULFATE 2 GM/50ML IV SOLN
2.0000 g | Freq: Once | INTRAVENOUS | Status: AC
  Administered 2020-08-06: 2 g via INTRAVENOUS
  Filled 2020-08-06: qty 50

## 2020-08-06 NOTE — Progress Notes (Signed)
PROGRESS NOTE  Julia Hart XBD:[id number] DOB: February 01, 1927 DOA: 08/03/2020 PCP: [name], MD   Brief History: 87 year old female with a history of coronary artery disease/NSTEMI, hypertension, hyperlipidemia, ischemic cardiomyopathy with previous EF 35% presenting with 1 week history of shortness of breath and worsening lower extremity edema. The patient states that she has gained weight from 120 pounds to 140 pounds, but cannot tell me the duration of which this occurred. She denies any fevers, chills, headache, sore throat, hemoptysis, nausea, vomiting, diarrhea, abdominal pain. Normally, the patient takes the torsemide 20 mg every other day. Since her worsening shortness of breath, the patient has been taking her torsemide on a daily basis for the last 5 days prior to admission. She states that she had recently followed up with her cardiologist, Dr. Orpah Greek in [place] on July 20, 2020. No changes were made at that time with her heart regimen. Notably, the patient had a recent hospitalization in [place] November 2021 where she had a hemoglobin of 4.9 and was transfused 4 units PRBC. EGD on 06/26/2020 showed a small hiatus hernia, normal esophagus. She was recently restarted on apixaban on 07/20/2020.  In the emergency department, the patient was afebrile and hemodynamically stable with oxygen saturation 92% on room air. The patient was started on IV furosemide. She was also noted to have COVID-positive RT-PCR. She was started on remdesivir and steroids.  Assessment/Plan: Acute on chronic systolic CHF -8/34/1962 echo EF 50% -08/05/20 Echo 65-70%, no WMA, mod enlarge RV, mod TR; small pericardial effusion -Previously had echo with a EF of 35% -remains clinically fluid overloaded -Continue IV furosemide -Daily weights--132.3 lbs (1/15)>>126.7 lbs (1/16) -Accurate I's and O's -Continue isosorbide, hydralazine -Continue spironolactone -Holding  Entresto temporarily  Coronary artery disease -history of NSTEMI and PCI of the ostial and proximal RCA 08/27/2017 -No chest pain presently -08/27/2017 cath--RCA ostial 80% calcified stenosis;occluded SVG jump graft;first OM 70% stenosis;90% circumflex stenosis -Continue Plavix and apixaban  COVID-19 positive -She has been triple vaccinated -Clinically stable presently -D-dimer-1.57>>1.43>>1.04 -CRP 0.7>>0.8>>0.9 -ferritin 72>>75>>65 -stable on RA -continue remdesivir  Essential hypertension -Continue hydralazine and isosorbide -Holding amlodipine to allow margin for diuresis  Hyperlipidemia -Continue statin  GERD -Continue Protonix  Chronic atrial fibrillation -Rate controlled -Continue apixaban  Hypomagnesemia/Hypokalemia -replete      Status is: Inpatient  Remains inpatient appropriate because:IV treatments appropriate due to intensity of illness or inability to take PO   Dispo: The patient is from:Home Anticipated d/c is IW:LNLG Anticipated d/c date is: 2 days Patient currently is not medically stable to d/c.        Family Communication:noFamily at bedside  Consultants:none  Code Status: DNI  DVT Prophylaxis:apixaban   Procedures: As Listed in Progress Note Above  Antibiotics: None    Subjective: Patient denies fevers, chills, headache, chest pain, dyspnea, nausea, vomiting, diarrhea, abdominal pain, dysuria, hematuria, hematochezia, and melena.   Objective: Vitals:   08/05/20 1349 08/05/20 2135 08/06/20 0500 08/06/20 0641  BP: (!) 140/52 (!) 156/61  (!) 150/49  Pulse: 69 81  65  Resp: 17 18  18   Temp: 98.2 F (36.8 C) 98.6 F (37 C)  98.6 F (37 C)  TempSrc: Oral Oral  Oral  SpO2: 99% 99%  97%  Weight:   59.8 kg   Height:        Intake/Output Summary (Last 24 hours) at 08/06/2020 1223 Last data filed at 08/06/2020 1155 Gross per 24 hour  Intake  480 ml  Output 5750 ml  Net -5270 ml   Weight change:  Exam:   General:  Pt is alert, follows commands appropriately, not in acute distress  HEENT: No icterus, No thrush, No neck mass, [place]/AT  Cardiovascular: RRR, S1/S2, no rubs, no gallops  Respiratory: bibasilar crackles. No wheeze  Abdomen: Soft/+BS, non tender, non distended, no guarding  Extremities: 1+ LE edema, No lymphangitis, No petechiae, No rashes, no synovitis   Data Reviewed: I have personally reviewed following labs and imaging studies Basic Metabolic Panel: Recent Labs  Lab 08/03/20 1837 08/04/20 0459 08/05/20 0644 08/06/20 0722  NA 133* 138 137 135  K 3.5 3.6 3.3* 4.0  CL 101 104 103 96*  CO2 25 24 27 30   GLUCOSE 121* 120* 83 77  BUN 26* 23 26* 28*  CREATININE 1.21* 1.04* 1.12* 1.14*  CALCIUM 8.2* 8.5* 8.2* 8.7*  MG  --  1.4* 1.3* 1.5*  PHOS  --  3.8 3.6 3.7   Liver Function Tests: Recent Labs  Lab 08/03/20 1837 08/04/20 0459 08/05/20 0644 08/06/20 0722  AST 30 26 24 25   ALT 18 15 13 15   ALKPHOS 92 86 71 75  BILITOT 0.5 0.8 0.6 0.7  PROT 6.5 6.3* 5.5* 5.7*  ALBUMIN 3.1* 2.9* 2.4* 2.7*   No results for input(s): LIPASE, AMYLASE in the last 168 hours. No results for input(s): AMMONIA in the last 168 hours. Coagulation Profile: No results for input(s): INR, PROTIME in the last 168 hours. CBC: Recent Labs  Lab 08/03/20 1837  WBC 4.6  NEUTROABS 2.8  HGB 9.2*  HCT 28.5*  MCV 90.2  PLT 180   Cardiac Enzymes: No results for input(s): CKTOTAL, CKMB, CKMBINDEX, TROPONINI in the last 168 hours. BNP: Invalid input(s): POCBNP CBG: No results for input(s): GLUCAP in the last 168 hours. HbA1C: No results for input(s): HGBA1C in the last 72 hours. Urine analysis: No results found for: COLORURINE, APPEARANCEUR, LABSPEC, PHURINE, GLUCOSEU, HGBUR, BILIRUBINUR, KETONESUR, PROTEINUR, UROBILINOGEN, NITRITE, LEUKOCYTESUR Sepsis Labs: @LABRCNTIP (procalcitonin:4,lacticidven:4) ) Recent Results  (from the past 240 hour(s))  Resp Panel by RT-PCR (Flu A&B, Covid) Nasopharyngeal Swab     Status: Abnormal   Collection Time: 08/03/20  6:38 PM   Specimen: Nasopharyngeal Swab; Nasopharyngeal(NP) swabs in vial transport medium  Result Value Ref Range Status   SARS Coronavirus 2 by RT PCR POSITIVE (A) NEGATIVE Final    Comment: RESULT CALLED TO, READ BACK BY AND VERIFIED WITH: C TURNER,RN@[email or web address]  08/03/20 MKELLY (NOTE) SARS-CoV-2 target nucleic acids are DETECTED.  The SARS-CoV-2 RNA is generally detectable in upper respiratory specimens during the acute phase of infection. Positive results are indicative of the presence of the identified virus, but do not rule out bacterial infection or co-infection with other pathogens not detected by the test. Clinical correlation with patient history and other diagnostic information is necessary to determine patient infection status. The expected result is Negative.  Fact Sheet for Patients: EntrepreneurPulse.com.au  Fact Sheet for Healthcare Providers: IncredibleEmployment.be  This test is not yet approved or cleared by the [place] FDA and  has been authorized for detection and/or diagnosis of SARS-CoV-2 by FDA under an Emergency Use Authorization (EUA).  This EUA will remain in effect (meaning this test can be  used) for the duration of  the COVID-19 declaration under Section 564(b)(1) of the Act, 21 U.S.C. section 360bbb-3(b)(1), unless the authorization is terminated or revoked sooner.     Influenza A by PCR NEGATIVE NEGATIVE Final   Influenza B by  PCR NEGATIVE NEGATIVE Final    Comment: (NOTE) The Xpert Xpress SARS-CoV-2/FLU/RSV plus assay is intended as an aid in the diagnosis of influenza from Nasopharyngeal swab specimens and should not be used as a sole basis for treatment. Nasal washings and aspirates are unacceptable for Xpert Xpress SARS-CoV-2/FLU/RSV testing.  Fact Sheet for  Patients: EntrepreneurPulse.com.au  Fact Sheet for Healthcare Providers: IncredibleEmployment.be  This test is not yet approved or cleared by the [place] FDA and has been authorized for detection and/or diagnosis of SARS-CoV-2 by FDA under an Emergency Use Authorization (EUA). This EUA will remain in effect (meaning this test can be used) for the duration of the COVID-19 declaration under Section 564(b)(1) of the Act, 21 U.S.C. section 360bbb-3(b)(1), unless the authorization is terminated or revoked.  Performed at [facility], [address] [place],  [address]      Scheduled Meds: . apixaban  2.5 mg Oral BID  . vitamin C  500 mg Oral Daily  . atorvastatin  20 mg Oral QHS  . clopidogrel  75 mg Oral Daily  . furosemide  40 mg Intravenous Q12H  . hydrALAZINE  50 mg Oral Q8H  . isosorbide dinitrate  5 mg Oral BID  . polyethylene glycol  17 g Oral Daily  . prednisoLONE acetate  1 drop Both Eyes QODAY  . spironolactone  25 mg Oral Daily  . zinc sulfate  220 mg Oral Daily   Continuous Infusions: . magnesium sulfate bolus IVPB 2 g (08/06/20 1152)  . remdesivir 100 mg in NS 100 mL 100 mg (08/06/20 3151)    Procedures/Studies: DG Chest Port 1 View  Result Date: 08/03/2020 CLINICAL DATA:  87 year old female with shortness of breath. EXAM: PORTABLE CHEST 1 VIEW COMPARISON:  None. FINDINGS: There is cardiomegaly with vascular congestion and mild edema. Atypical pneumonia is not excluded clinical correlation is recommended. Probable trace bilateral pleural effusion. No pneumothorax. Median sternotomy wires and CABG vascular clips. Atherosclerotic calcification the aorta. No acute osseous pathology. IMPRESSION: Cardiomegaly with findings of CHF. Atypical pneumonia is not excluded. Electronically Signed   By: Anner Crete M.D.   On: 08/03/2020 19:17   ECHOCARDIOGRAM COMPLETE  Result Date: 08/04/2020    ECHOCARDIOGRAM REPORT   Patient  Name:   Julia Hart Date of Exam: 08/04/2020 Medical Rec #:  [id number]     Height:       66.0 in Accession #:    [id number]    Weight:       139.8 lb Date of Birth:  June 28, 1927     BSA:          1.717 m Patient Age:    37 years      BP:           154/77 mmHg Patient Gender: F             HR:           76 bpm. Exam Location:  [facility] Procedure: 2D Echo Indications:    Cardiomyopathy-Ischemic I25.5  History:        Patient has no prior history of Echocardiogram examinations.                 CHF, CAD, Arrythmias:Atrial Fibrillation; Risk                 Factors:Hypertension. COVID-19 virus infection.  Sonographer:    Leavy Cella RDCS (AE) Referring Phys: [place]  1. Left ventricular ejection fraction, by estimation, is  65 to 70%. The left ventricle has normal function. The left ventricle has no regional wall motion abnormalities. There is severe left ventricular hypertrophy. Left ventricular diastolic parameters  are consistent with Grade II diastolic dysfunction (pseudonormalization). Elevated left atrial pressure.  2. Right ventricular systolic function is mildly reduced. The right ventricular size is moderately enlarged.  3. Left atrial size was severely dilated.  4. Right atrial size was moderately dilated.  5. A small pericardial effusion is present. The pericardial effusion is circumferential.  6. The mitral valve is abnormal. Mild mitral valve regurgitation. No evidence of mitral stenosis. Moderate mitral annular calcification.  7. Tricuspid valve regurgitation is moderate.  8. The aortic valve is tricuspid. There is severe calcifcation of the aortic valve. There is severe thickening of the aortic valve. Aortic valve regurgitation is moderate. Moderate aortic valve stenosis.  9. Moderate pulmonary HTN, PASP is 55 mmHg. 10. The inferior vena cava is dilated in size with <50% respiratory variability, suggesting right atrial pressure of 15 mmHg. FINDINGS  Left Ventricle: Left  ventricular ejection fraction, by estimation, is 65 to 70%. The left ventricle has normal function. The left ventricle has no regional wall motion abnormalities. The left ventricular internal cavity size was normal in size. There is  severe left ventricular hypertrophy. Left ventricular diastolic parameters are consistent with Grade II diastolic dysfunction (pseudonormalization). Elevated left atrial pressure. Right Ventricle: The right ventricular size is moderately enlarged. Right vetricular wall thickness was not assessed. Right ventricular systolic function is mildly reduced. Left Atrium: Left atrial size was severely dilated. Right Atrium: Right atrial size was moderately dilated. Pericardium: A small pericardial effusion is present. The pericardial effusion is circumferential. Mitral Valve: The mitral valve is abnormal. There is moderate thickening of the mitral valve leaflet(s). There is moderate calcification of the mitral valve leaflet(s). Moderate mitral annular calcification. Mild mitral valve regurgitation. No evidence of mitral valve stenosis. Tricuspid Valve: The tricuspid valve is normal in structure. Tricuspid valve regurgitation is moderate . No evidence of tricuspid stenosis. Aortic Valve: The aortic valve is tricuspid. There is severe calcifcation of the aortic valve. There is severe thickening of the aortic valve. There is severe aortic valve annular calcification. Aortic valve regurgitation is moderate. Aortic regurgitation PHT measures 263 msec. Moderate aortic stenosis is present. Aortic valve mean gradient measures 21.4 mmHg. Aortic valve peak gradient measures 39.6 mmHg. Aortic valve area, by VTI measures 1.34 cm. Pulmonic Valve: The pulmonic valve was not well visualized. Pulmonic valve regurgitation is not visualized. No evidence of pulmonic stenosis. Aorta: The aortic root is normal in size and structure. Pulmonary Artery: Moderate pulmonary HTN, PASP is 55 mmHg. Venous: The inferior  vena cava is dilated in size with less than 50% respiratory variability, suggesting right atrial pressure of 15 mmHg. IAS/Shunts: No atrial level shunt detected by color flow Doppler.  LEFT VENTRICLE PLAX 2D LVIDd:         3.71 cm  Diastology LVIDs:         2.33 cm  LV e' medial:    5.22 cm/s LV PW:         1.85 cm  LV E/e' medial:  19.1 LV IVS:        2.02 cm  LV e' lateral:   6.31 cm/s LVOT diam:     2.00 cm  LV E/e' lateral: 15.8 LV SV:         96 LV SV Index:   56 LVOT Area:     3.14 cm  RIGHT VENTRICLE RV S prime:     9.46 cm/s TAPSE (M-mode): 1.2 cm LEFT ATRIUM             Index       RIGHT ATRIUM           Index LA diam:        3.90 cm 2.27 cm/m  RA Area:     20.70 cm LA Vol (A2C):   90.5 ml 52.70 ml/m RA Volume:   59.30 ml  34.53 ml/m LA Vol (A4C):   66.2 ml 38.55 ml/m LA Biplane Vol: 77.8 ml 45.30 ml/m  AORTIC VALVE AV Area (Vmax):    1.31 cm AV Area (Vmean):   1.26 cm AV Area (VTI):     1.34 cm AV Vmax:           314.71 cm/s AV Vmean:          214.903 cm/s AV VTI:            0.717 m AV Peak Grad:      39.6 mmHg AV Mean Grad:      21.4 mmHg LVOT Vmax:         130.99 cm/s LVOT Vmean:        86.254 cm/s LVOT VTI:          0.305 m LVOT/AV VTI ratio: 0.43 AI PHT:            263 msec  AORTA Ao Root diam: 2.60 cm MITRAL VALVE               TRICUSPID VALVE MV Area (PHT): 3.12 cm    TR Peak grad:   40.7 mmHg MV Decel Time: 243 msec    TR Vmax:        319.00 cm/s MR Peak grad: 114.1 mmHg MR Mean grad: 76.0 mmHg    SHUNTS MR Vmax:      534.00 cm/s  Systemic VTI:  0.30 m MR Vmean:     404.0 cm/s   Systemic Diam: 2.00 cm MV E velocity: 99.50 cm/s MV A velocity: 57.40 cm/s MV E/A ratio:  1.73 Carlyle Dolly MD Electronically signed by Carlyle Dolly MD Signature Date/Time: 08/04/2020/11:29:14 AM    Final     [name], DO  [facility]  If 7PM-7AM, please contact night-coverage [email or web address] Password [email or web address] 08/06/2020, 12:23 PM   LOS: 3 days

## 2020-08-06 NOTE — Plan of Care (Signed)
  Problem: Education: Goal: Knowledge of General Education information will improve Description: Including pain rating scale, medication(s)/side effects and non-pharmacologic comfort measures Outcome: Progressing   Problem: Health Behavior/Discharge Planning: Goal: Ability to manage health-related needs will improve Outcome: Progressing   Problem: Clinical Measurements: Goal: Ability to maintain clinical measurements within normal limits will improve Outcome: Progressing Goal: Will remain free from infection Outcome: Progressing Goal: Diagnostic test results will improve Outcome: Progressing Goal: Respiratory complications will improve Outcome: Progressing Goal: Cardiovascular complication will be avoided Outcome: Progressing   Problem: Activity: Goal: Risk for activity intolerance will decrease Outcome: Progressing   Problem: Nutrition: Goal: Adequate nutrition will be maintained Outcome: Progressing   Problem: Coping: Goal: Level of anxiety will decrease Outcome: Progressing   Problem: Elimination: Goal: Will not experience complications related to bowel motility Outcome: Progressing Goal: Will not experience complications related to urinary retention Outcome: Progressing   Problem: Pain Managment: Goal: General experience of comfort will improve Outcome: Progressing   Problem: Safety: Goal: Ability to remain free from injury will improve Outcome: Progressing   Problem: Skin Integrity: Goal: Risk for impaired skin integrity will decrease Outcome: Progressing   Problem: Education: Goal: Knowledge of risk factors and measures for prevention of condition will improve Outcome: Progressing   Problem: Coping: Goal: Psychosocial and spiritual needs will be supported Outcome: Progressing   Problem: Respiratory: Goal: Will maintain a patent airway Outcome: Progressing Goal: Complications related to the disease process, condition or treatment will be avoided or  minimized Outcome: Progressing   Problem: Education: Goal: Ability to demonstrate management of disease process will improve Outcome: Progressing Goal: Ability to verbalize understanding of medication therapies will improve Outcome: Progressing Goal: Individualized Educational Video(s) Outcome: Progressing   Problem: Activity: Goal: Capacity to carry out activities will improve Outcome: Progressing   Problem: Cardiac: Goal: Ability to achieve and maintain adequate cardiopulmonary perfusion will improve Outcome: Progressing   

## 2020-08-06 NOTE — Discharge Summary (Signed)
Physician Discharge Summary  Julia Hart [id number] DOB: 03/24/1927 DOA: 08/03/2020  PCP: [name], MD  Admit date: 08/03/2020 Discharge date: 08/07/2020   Admitted From: home Disposition:  Home / SNF  Recommendations for Outpatient Follow-up:  1. Follow up with PCP in 1-2 weeks 2. Please obtain BMP/CBC in one week     Discharge Condition: Stable CODE STATUS: DNI Diet recommendation: Heart Healthy    Brief/Interim Summary: 87 year old female with a history of coronary artery disease/NSTEMI, hypertension, hyperlipidemia, ischemic cardiomyopathy with previous EF 35% presenting with 1 week history of shortness of breath and worsening lower extremity edema. The patient states that she has gained weight from 120 pounds to 140 pounds, but cannot tell me the duration of which this occurred. She denies any fevers, chills, headache, sore throat, hemoptysis, nausea, vomiting, diarrhea, abdominal pain. Normally, the patient takes the torsemide 20 mg every other day. Since her worsening shortness of breath, the patient has been taking her torsemide on a daily basis for the last 5 days prior to admission. She states that she had recently followed up with her cardiologist, Dr. Orpah Greek in [place] on July 20, 2020. No changes were made at that time with her heart regimen. Notably, the patient had a recent hospitalization in [place] November 2021 where she had a hemoglobin of 4.9 and was transfused 4 units PRBC. EGD on 06/26/2020 showed a small hiatus hernia, normal esophagus. She was recently restarted on apixaban on 07/20/2020.  In the emergency department, the patient was afebrile and hemodynamically stable with oxygen saturation 92% on room air. The patient was started on IV furosemide. She was also noted to have COVID-positive RT-PCR. She was started on remdesivir and steroids.  Discharge Diagnoses:  Acute on chronic systolic CHF -99991111 echo EF  50% -08/05/20 Echo 65-70%, no WMA, mod enlarge RV, mod TR; small pericardial effusion -pt states dry weight ~120 lbs -Previously had echo with a EF of 35% -Continue IV furosemide 40 mg bid>>home with torsemide 20 mg daily (was taking every other day) -Daily weights--132.3 lbs (1/15)>>126.7 lbs (1/16)>>121.2 lbs (08/07/20) -Accurate I's and O's -Continue isosorbide, hydralazine -Continue spironolactone -Holding Entresto temporarily>>restarted  Coronary artery disease -history of NSTEMI and PCI of the ostial and proximal RCA 08/27/2017 -No chest pain presently -08/27/2017 cath--RCA ostial 80% calcified stenosis;occluded SVG jump graft;first OM 70% stenosis;90% circumflex stenosis -Continue Plavix and apixaban  COVID-19 positive -She hasbeentriple vaccinated -Clinically stable presently -D-dimer-1.57>>1.43>>1.04 -CRP 0.7>>0.8>>0.9 -ferritin 72>>75>>65 -stable on RA -continue remdesivir--had 4 days during hospitalization  Essential hypertension -Continue hydralazine and isosorbide -Holding amlodipine to allow margin for diuresis>>restart  Hyperlipidemia -Continue statin  GERD -Continue Protonix  Chronic atrial fibrillation -Rate controlled -Continue apixaban  Hypomagnesemia/Hypokalemia -repleted     Discharge Instructions   Allergies as of 08/07/2020   No Known Allergies     Medication List    STOP taking these medications   CALTRATE 600+D PO   epoetin alfa 40000 UNIT/ML injection Commonly known as: EPOGEN   multivitamins with iron Tabs tablet   Tab-A-Vite Tabs     TAKE these medications   amLODipine 5 MG tablet Commonly known as: NORVASC Take 5 mg by mouth daily.   apixaban 2.5 MG Tabs tablet Commonly known as: ELIQUIS Take by mouth 2 (two) times daily.   atorvastatin 20 MG tablet Commonly known as: LIPITOR Take 20 mg by mouth at bedtime.   clopidogrel 75 MG tablet Commonly known as: PLAVIX Take 75 mg by mouth daily.   docusate  sodium  100 MG capsule Commonly known as: COLACE Take 100 mg by mouth daily.   Entresto 97-103 MG Generic drug: sacubitril-valsartan Take 1 tablet by mouth 2 (two) times daily.   guaiFENesin 600 MG 12 hr tablet Commonly known as: MUCINEX Take 600 mg by mouth 2 (two) times daily.   hydrALAZINE 50 MG tablet Commonly known as: APRESOLINE Take 50 mg by mouth 3 (three) times daily.   isosorbide dinitrate 5 MG tablet Commonly known as: ISORDIL Take 5 mg by mouth 2 (two) times daily.   metoprolol succinate 25 MG 24 hr tablet Commonly known as: TOPROL-XL Take 25 mg by mouth daily.   nitroGLYCERIN 0.4 MG SL tablet Commonly known as: NITROSTAT Place 0.4 mg under the tongue every 5 (five) minutes as needed for chest pain.   pantoprazole 40 MG tablet Commonly known as: PROTONIX Take 40 mg by mouth 2 (two) times daily.   prednisoLONE acetate 1 % ophthalmic suspension Commonly known as: PRED FORTE Place 1 drop into both eyes every other day.   spironolactone 25 MG tablet Commonly known as: ALDACTONE Take 25 mg by mouth daily.   torsemide 20 MG tablet Commonly known as: DEMADEX Take 1 tablet (20 mg total) by mouth daily. What changed: when to take this       No Known Allergies  Consultations:  none   Procedures/Studies: DG Chest Port 1 View  Result Date: 08/03/2020 CLINICAL DATA:  87 year old female with shortness of breath. EXAM: PORTABLE CHEST 1 VIEW COMPARISON:  None. FINDINGS: There is cardiomegaly with vascular congestion and mild edema. Atypical pneumonia is not excluded clinical correlation is recommended. Probable trace bilateral pleural effusion. No pneumothorax. Median sternotomy wires and CABG vascular clips. Atherosclerotic calcification the aorta. No acute osseous pathology. IMPRESSION: Cardiomegaly with findings of CHF. Atypical pneumonia is not excluded. Electronically Signed   By: Anner Crete M.D.   On: 08/03/2020 19:17   ECHOCARDIOGRAM  COMPLETE  Result Date: 08/04/2020    ECHOCARDIOGRAM REPORT   Patient Name:   Julia Hart Date of Exam: 08/04/2020 Medical Rec #:  [id number]     Height:       66.0 in Accession #:    [id number]    Weight:       139.8 lb Date of Birth:  Jun 10, 1927     BSA:          1.717 m Patient Age:    75 years      BP:           154/77 mmHg Patient Gender: F             HR:           76 bpm. Exam Location:  [facility] Procedure: 2D Echo Indications:    Cardiomyopathy-Ischemic I25.5  History:        Patient has no prior history of Echocardiogram examinations.                 CHF, CAD, Arrythmias:Atrial Fibrillation; Risk                 Factors:Hypertension. COVID-19 virus infection.  Sonographer:    Leavy Cella RDCS (AE) Referring Phys: [place]  1. Left ventricular ejection fraction, by estimation, is 65 to 70%. The left ventricle has normal function. The left ventricle has no regional wall motion abnormalities. There is severe left ventricular hypertrophy. Left ventricular diastolic parameters  are consistent with Grade II diastolic dysfunction (pseudonormalization). Elevated left atrial pressure.  2. Right ventricular systolic function is mildly reduced. The right ventricular size is moderately enlarged.  3. Left atrial size was severely dilated.  4. Right atrial size was moderately dilated.  5. A small pericardial effusion is present. The pericardial effusion is circumferential.  6. The mitral valve is abnormal. Mild mitral valve regurgitation. No evidence of mitral stenosis. Moderate mitral annular calcification.  7. Tricuspid valve regurgitation is moderate.  8. The aortic valve is tricuspid. There is severe calcifcation of the aortic valve. There is severe thickening of the aortic valve. Aortic valve regurgitation is moderate. Moderate aortic valve stenosis.  9. Moderate pulmonary HTN, PASP is 55 mmHg. 10. The inferior vena cava is dilated in size with <50% respiratory variability,  suggesting right atrial pressure of 15 mmHg. FINDINGS  Left Ventricle: Left ventricular ejection fraction, by estimation, is 65 to 70%. The left ventricle has normal function. The left ventricle has no regional wall motion abnormalities. The left ventricular internal cavity size was normal in size. There is  severe left ventricular hypertrophy. Left ventricular diastolic parameters are consistent with Grade II diastolic dysfunction (pseudonormalization). Elevated left atrial pressure. Right Ventricle: The right ventricular size is moderately enlarged. Right vetricular wall thickness was not assessed. Right ventricular systolic function is mildly reduced. Left Atrium: Left atrial size was severely dilated. Right Atrium: Right atrial size was moderately dilated. Pericardium: A small pericardial effusion is present. The pericardial effusion is circumferential. Mitral Valve: The mitral valve is abnormal. There is moderate thickening of the mitral valve leaflet(s). There is moderate calcification of the mitral valve leaflet(s). Moderate mitral annular calcification. Mild mitral valve regurgitation. No evidence of mitral valve stenosis. Tricuspid Valve: The tricuspid valve is normal in structure. Tricuspid valve regurgitation is moderate . No evidence of tricuspid stenosis. Aortic Valve: The aortic valve is tricuspid. There is severe calcifcation of the aortic valve. There is severe thickening of the aortic valve. There is severe aortic valve annular calcification. Aortic valve regurgitation is moderate. Aortic regurgitation PHT measures 263 msec. Moderate aortic stenosis is present. Aortic valve mean gradient measures 21.4 mmHg. Aortic valve peak gradient measures 39.6 mmHg. Aortic valve area, by VTI measures 1.34 cm. Pulmonic Valve: The pulmonic valve was not well visualized. Pulmonic valve regurgitation is not visualized. No evidence of pulmonic stenosis. Aorta: The aortic root is normal in size and structure.  Pulmonary Artery: Moderate pulmonary HTN, PASP is 55 mmHg. Venous: The inferior vena cava is dilated in size with less than 50% respiratory variability, suggesting right atrial pressure of 15 mmHg. IAS/Shunts: No atrial level shunt detected by color flow Doppler.  LEFT VENTRICLE PLAX 2D LVIDd:         3.71 cm  Diastology LVIDs:         2.33 cm  LV e' medial:    5.22 cm/s LV PW:         1.85 cm  LV E/e' medial:  19.1 LV IVS:        2.02 cm  LV e' lateral:   6.31 cm/s LVOT diam:     2.00 cm  LV E/e' lateral: 15.8 LV SV:         96 LV SV Index:   56 LVOT Area:     3.14 cm  RIGHT VENTRICLE RV S prime:     9.46 cm/s TAPSE (M-mode): 1.2 cm LEFT ATRIUM             Index       RIGHT ATRIUM  Index LA diam:        3.90 cm 2.27 cm/m  RA Area:     20.70 cm LA Vol (A2C):   90.5 ml 52.70 ml/m RA Volume:   59.30 ml  34.53 ml/m LA Vol (A4C):   66.2 ml 38.55 ml/m LA Biplane Vol: 77.8 ml 45.30 ml/m  AORTIC VALVE AV Area (Vmax):    1.31 cm AV Area (Vmean):   1.26 cm AV Area (VTI):     1.34 cm AV Vmax:           314.71 cm/s AV Vmean:          214.903 cm/s AV VTI:            0.717 m AV Peak Grad:      39.6 mmHg AV Mean Grad:      21.4 mmHg LVOT Vmax:         130.99 cm/s LVOT Vmean:        86.254 cm/s LVOT VTI:          0.305 m LVOT/AV VTI ratio: 0.43 AI PHT:            263 msec  AORTA Ao Root diam: 2.60 cm MITRAL VALVE               TRICUSPID VALVE MV Area (PHT): 3.12 cm    TR Peak grad:   40.7 mmHg MV Decel Time: 243 msec    TR Vmax:        319.00 cm/s MR Peak grad: 114.1 mmHg MR Mean grad: 76.0 mmHg    SHUNTS MR Vmax:      534.00 cm/s  Systemic VTI:  0.30 m MR Vmean:     404.0 cm/s   Systemic Diam: 2.00 cm MV E velocity: 99.50 cm/s MV A velocity: 57.40 cm/s MV E/A ratio:  1.73 Carlyle Dolly MD Electronically signed by Carlyle Dolly MD Signature Date/Time: 08/04/2020/11:29:14 AM    Final         Discharge Exam: Vitals:   08/06/20 2039 08/07/20 0400  BP: (!) 156/40 (!) 149/45  Pulse: 71 66  Resp:  18   Temp: 98.1 F (36.7 C) 98 F (36.7 C)  SpO2: 98% 97%   Vitals:   08/06/20 1433 08/06/20 2039 08/07/20 0400 08/07/20 0500  BP: (!) 117/44 (!) 156/40 (!) 149/45   Pulse: 80 71 66   Resp: 18  18   Temp: 98 F (36.7 C) 98.1 F (36.7 C) 98 F (36.7 C)   TempSrc: Oral Oral    SpO2: 100% 98% 97%   Weight:    55.4 kg  Height:        General: Pt is alert, awake, not in acute distress Cardiovascular: RRR, S1/S2 +, no rubs, no gallops Respiratory: CTA bilaterally, no wheezing, no rhonchi Abdominal: Soft, NT, ND, bowel sounds + Extremities: no edema, no cyanosis   The results of significant diagnostics from this hospitalization (including imaging, microbiology, ancillary and laboratory) are listed below for reference.    Significant Diagnostic Studies: DG Chest Port 1 View  Result Date: 08/03/2020 CLINICAL DATA:  87 year old female with shortness of breath. EXAM: PORTABLE CHEST 1 VIEW COMPARISON:  None. FINDINGS: There is cardiomegaly with vascular congestion and mild edema. Atypical pneumonia is not excluded clinical correlation is recommended. Probable trace bilateral pleural effusion. No pneumothorax. Median sternotomy wires and CABG vascular clips. Atherosclerotic calcification the aorta. No acute osseous pathology. IMPRESSION: Cardiomegaly with findings of CHF. Atypical pneumonia is not excluded. Electronically  Signed   By: Anner Crete M.D.   On: 08/03/2020 19:17   ECHOCARDIOGRAM COMPLETE  Result Date: 08/04/2020    ECHOCARDIOGRAM REPORT   Patient Name:   Julia Hart Date of Exam: 08/04/2020 Medical Rec #:  [id number]     Height:       66.0 in Accession #:    [id number]    Weight:       139.8 lb Date of Birth:  27-Dec-1926     BSA:          1.717 m Patient Age:    34 years      BP:           154/77 mmHg Patient Gender: F             HR:           76 bpm. Exam Location:  [facility] Procedure: 2D Echo Indications:    Cardiomyopathy-Ischemic I25.5  History:        Patient has no prior  history of Echocardiogram examinations.                 CHF, CAD, Arrythmias:Atrial Fibrillation; Risk                 Factors:Hypertension. COVID-19 virus infection.  Sonographer:    Leavy Cella RDCS (AE) Referring Phys: [place]  1. Left ventricular ejection fraction, by estimation, is 65 to 70%. The left ventricle has normal function. The left ventricle has no regional wall motion abnormalities. There is severe left ventricular hypertrophy. Left ventricular diastolic parameters  are consistent with Grade II diastolic dysfunction (pseudonormalization). Elevated left atrial pressure.  2. Right ventricular systolic function is mildly reduced. The right ventricular size is moderately enlarged.  3. Left atrial size was severely dilated.  4. Right atrial size was moderately dilated.  5. A small pericardial effusion is present. The pericardial effusion is circumferential.  6. The mitral valve is abnormal. Mild mitral valve regurgitation. No evidence of mitral stenosis. Moderate mitral annular calcification.  7. Tricuspid valve regurgitation is moderate.  8. The aortic valve is tricuspid. There is severe calcifcation of the aortic valve. There is severe thickening of the aortic valve. Aortic valve regurgitation is moderate. Moderate aortic valve stenosis.  9. Moderate pulmonary HTN, PASP is 55 mmHg. 10. The inferior vena cava is dilated in size with <50% respiratory variability, suggesting right atrial pressure of 15 mmHg. FINDINGS  Left Ventricle: Left ventricular ejection fraction, by estimation, is 65 to 70%. The left ventricle has normal function. The left ventricle has no regional wall motion abnormalities. The left ventricular internal cavity size was normal in size. There is  severe left ventricular hypertrophy. Left ventricular diastolic parameters are consistent with Grade II diastolic dysfunction (pseudonormalization). Elevated left atrial pressure. Right Ventricle: The right  ventricular size is moderately enlarged. Right vetricular wall thickness was not assessed. Right ventricular systolic function is mildly reduced. Left Atrium: Left atrial size was severely dilated. Right Atrium: Right atrial size was moderately dilated. Pericardium: A small pericardial effusion is present. The pericardial effusion is circumferential. Mitral Valve: The mitral valve is abnormal. There is moderate thickening of the mitral valve leaflet(s). There is moderate calcification of the mitral valve leaflet(s). Moderate mitral annular calcification. Mild mitral valve regurgitation. No evidence of mitral valve stenosis. Tricuspid Valve: The tricuspid valve is normal in structure. Tricuspid valve regurgitation is moderate . No evidence of tricuspid stenosis. Aortic Valve: The aortic valve is tricuspid.  There is severe calcifcation of the aortic valve. There is severe thickening of the aortic valve. There is severe aortic valve annular calcification. Aortic valve regurgitation is moderate. Aortic regurgitation PHT measures 263 msec. Moderate aortic stenosis is present. Aortic valve mean gradient measures 21.4 mmHg. Aortic valve peak gradient measures 39.6 mmHg. Aortic valve area, by VTI measures 1.34 cm. Pulmonic Valve: The pulmonic valve was not well visualized. Pulmonic valve regurgitation is not visualized. No evidence of pulmonic stenosis. Aorta: The aortic root is normal in size and structure. Pulmonary Artery: Moderate pulmonary HTN, PASP is 55 mmHg. Venous: The inferior vena cava is dilated in size with less than 50% respiratory variability, suggesting right atrial pressure of 15 mmHg. IAS/Shunts: No atrial level shunt detected by color flow Doppler.  LEFT VENTRICLE PLAX 2D LVIDd:         3.71 cm  Diastology LVIDs:         2.33 cm  LV e' medial:    5.22 cm/s LV PW:         1.85 cm  LV E/e' medial:  19.1 LV IVS:        2.02 cm  LV e' lateral:   6.31 cm/s LVOT diam:     2.00 cm  LV E/e' lateral: 15.8 LV SV:          96 LV SV Index:   56 LVOT Area:     3.14 cm  RIGHT VENTRICLE RV S prime:     9.46 cm/s TAPSE (M-mode): 1.2 cm LEFT ATRIUM             Index       RIGHT ATRIUM           Index LA diam:        3.90 cm 2.27 cm/m  RA Area:     20.70 cm LA Vol (A2C):   90.5 ml 52.70 ml/m RA Volume:   59.30 ml  34.53 ml/m LA Vol (A4C):   66.2 ml 38.55 ml/m LA Biplane Vol: 77.8 ml 45.30 ml/m  AORTIC VALVE AV Area (Vmax):    1.31 cm AV Area (Vmean):   1.26 cm AV Area (VTI):     1.34 cm AV Vmax:           314.71 cm/s AV Vmean:          214.903 cm/s AV VTI:            0.717 m AV Peak Grad:      39.6 mmHg AV Mean Grad:      21.4 mmHg LVOT Vmax:         130.99 cm/s LVOT Vmean:        86.254 cm/s LVOT VTI:          0.305 m LVOT/AV VTI ratio: 0.43 AI PHT:            263 msec  AORTA Ao Root diam: 2.60 cm MITRAL VALVE               TRICUSPID VALVE MV Area (PHT): 3.12 cm    TR Peak grad:   40.7 mmHg MV Decel Time: 243 msec    TR Vmax:        319.00 cm/s MR Peak grad: 114.1 mmHg MR Mean grad: 76.0 mmHg    SHUNTS MR Vmax:      534.00 cm/s  Systemic VTI:  0.30 m MR Vmean:     404.0 cm/s   Systemic Diam: 2.00 cm MV E  velocity: 99.50 cm/s MV A velocity: 57.40 cm/s MV E/A ratio:  1.73 Carlyle Dolly MD Electronically signed by Carlyle Dolly MD Signature Date/Time: 08/04/2020/11:29:14 AM    Final      Microbiology: Recent Results (from the past 240 hour(s))  Resp Panel by RT-PCR (Flu A&B, Covid) Nasopharyngeal Swab     Status: Abnormal   Collection Time: 08/03/20  6:38 PM   Specimen: Nasopharyngeal Swab; Nasopharyngeal(NP) swabs in vial transport medium  Result Value Ref Range Status   SARS Coronavirus 2 by RT PCR POSITIVE (A) NEGATIVE Final    Comment: RESULT CALLED TO, READ BACK BY AND VERIFIED WITH: C TURNER,RN@1944  08/03/20 MKELLY (NOTE) SARS-CoV-2 target nucleic acids are DETECTED.  The SARS-CoV-2 RNA is generally detectable in upper respiratory specimens during the acute phase of infection. Positive results  are indicative of the presence of the identified virus, but do not rule out bacterial infection or co-infection with other pathogens not detected by the test. Clinical correlation with patient history and other diagnostic information is necessary to determine patient infection status. The expected result is Negative.  Fact Sheet for Patients: EntrepreneurPulse.com.au  Fact Sheet for Healthcare Providers: IncredibleEmployment.be  This test is not yet approved or cleared by the [place] FDA and  has been authorized for detection and/or diagnosis of SARS-CoV-2 by FDA under an Emergency Use Authorization (EUA).  This EUA will remain in effect (meaning this test can be  used) for the duration of  the COVID-19 declaration under Section 564(b)(1) of the Act, 21 U.S.C. section 360bbb-3(b)(1), unless the authorization is terminated or revoked sooner.     Influenza A by PCR NEGATIVE NEGATIVE Final   Influenza B by PCR NEGATIVE NEGATIVE Final    Comment: (NOTE) The Xpert Xpress SARS-CoV-2/FLU/RSV plus assay is intended as an aid in the diagnosis of influenza from Nasopharyngeal swab specimens and should not be used as a sole basis for treatment. Nasal washings and aspirates are unacceptable for Xpert Xpress SARS-CoV-2/FLU/RSV testing.  Fact Sheet for Patients: EntrepreneurPulse.com.au  Fact Sheet for Healthcare Providers: IncredibleEmployment.be  This test is not yet approved or cleared by the [place] FDA and has been authorized for detection and/or diagnosis of SARS-CoV-2 by FDA under an Emergency Use Authorization (EUA). This EUA will remain in effect (meaning this test can be used) for the duration of the COVID-19 declaration under Section 564(b)(1) of the Act, 21 U.S.C. section 360bbb-3(b)(1), unless the authorization is terminated or revoked.  Performed at [facility], [address]  [place], [place] [address]      Labs: Basic Metabolic Panel: Recent Labs  Lab 08/03/20 1837 08/04/20 0459 08/05/20 0644 08/06/20 0722 08/07/20 0500  NA 133* 138 137 135 133*  K 3.5 3.6 3.3* 4.0 4.0  CL 101 104 103 96* 94*  CO2 25 24 27 30 29   GLUCOSE 121* 120* 83 77 76  BUN 26* 23 26* 28* 30*  CREATININE 1.21* 1.04* 1.12* 1.14* 1.10*  CALCIUM 8.2* 8.5* 8.2* 8.7* 8.7*  MG  --  1.4* 1.3* 1.5* 1.8  PHOS  --  3.8 3.6 3.7 4.5   Liver Function Tests: Recent Labs  Lab 08/03/20 1837 08/04/20 0459 08/05/20 0644 08/06/20 0722 08/07/20 0500  AST 30 26 24 25 26   ALT 18 15 13 15 14   ALKPHOS 92 86 71 75 72  BILITOT 0.5 0.8 0.6 0.7 0.6  PROT 6.5 6.3* 5.5* 5.7* 5.7*  ALBUMIN 3.1* 2.9* 2.4* 2.7* 2.7*   No results for input(s): LIPASE, AMYLASE  in the last 168 hours. No results for input(s): AMMONIA in the last 168 hours. CBC: Recent Labs  Lab 08/03/20 1837 08/07/20 0500  WBC 4.6 4.5  NEUTROABS 2.8  --   HGB 9.2* 9.5*  HCT 28.5* 28.7*  MCV 90.2 86.7  PLT 180 207   Cardiac Enzymes: No results for input(s): CKTOTAL, CKMB, CKMBINDEX, TROPONINI in the last 168 hours. BNP: Invalid input(s): POCBNP CBG: No results for input(s): GLUCAP in the last 168 hours.  Time coordinating discharge:  36 minutes  Signed:  [name], DO [facility] Pager: [phone] 08/07/2020, 1:02 PM

## 2020-08-07 LAB — CBC
HCT: 28.7 % — ABNORMAL LOW (ref 36.0–46.0)
Hemoglobin: 9.5 g/dL — ABNORMAL LOW (ref 12.0–15.0)
MCH: 28.7 pg (ref 26.0–34.0)
MCHC: 33.1 g/dL (ref 30.0–36.0)
MCV: 86.7 fL (ref 80.0–100.0)
Platelets: 207 10*3/uL (ref 150–400)
RBC: 3.31 MIL/uL — ABNORMAL LOW (ref 3.87–5.11)
RDW: 14.3 % (ref 11.5–15.5)
WBC: 4.5 10*3/uL (ref 4.0–10.5)
nRBC: 0 % (ref 0.0–0.2)

## 2020-08-07 LAB — COMPREHENSIVE METABOLIC PANEL
ALT: 14 U/L (ref 0–44)
AST: 26 U/L (ref 15–41)
Albumin: 2.7 g/dL — ABNORMAL LOW (ref 3.5–5.0)
Alkaline Phosphatase: 72 U/L (ref 38–126)
Anion gap: 10 (ref 5–15)
BUN: 30 mg/dL — ABNORMAL HIGH (ref 8–23)
CO2: 29 mmol/L (ref 22–32)
Calcium: 8.7 mg/dL — ABNORMAL LOW (ref 8.9–10.3)
Chloride: 94 mmol/L — ABNORMAL LOW (ref 98–111)
Creatinine, Ser: 1.1 mg/dL — ABNORMAL HIGH (ref 0.44–1.00)
GFR, Estimated: 47 mL/min — ABNORMAL LOW (ref >60)
Glucose, Bld: 76 mg/dL (ref 70–99)
Potassium: 4 mmol/L (ref 3.5–5.1)
Sodium: 133 mmol/L — ABNORMAL LOW (ref 135–145)
Total Bilirubin: 0.6 mg/dL (ref 0.3–1.2)
Total Protein: 5.7 g/dL — ABNORMAL LOW (ref 6.5–8.1)

## 2020-08-07 LAB — D-DIMER, QUANTITATIVE: D-Dimer, Quant: 1 ug/mL-FEU — ABNORMAL HIGH (ref 0.00–0.50)

## 2020-08-07 LAB — PHOSPHORUS: Phosphorus: 4.5 mg/dL (ref 2.5–4.6)

## 2020-08-07 LAB — MAGNESIUM: Magnesium: 1.8 mg/dL (ref 1.7–2.4)

## 2020-08-07 LAB — C-REACTIVE PROTEIN: CRP: 0.9 mg/dL (ref <1.0)

## 2020-08-07 LAB — FERRITIN: Ferritin: 73 ng/mL (ref 11–307)

## 2020-08-07 MED ORDER — TORSEMIDE 20 MG PO TABS: 20.0000 mg | ORAL_TABLET | Freq: Every day | ORAL | 1 refills | Status: DC

## 2020-08-07 NOTE — Plan of Care (Signed)
  Problem: Education: Goal: Knowledge of General Education information will improve Description: Including pain rating scale, medication(s)/side effects and non-pharmacologic comfort measures Outcome: Progressing   Problem: Health Behavior/Discharge Planning: Goal: Ability to manage health-related needs will improve Outcome: Progressing   Problem: Clinical Measurements: Goal: Ability to maintain clinical measurements within normal limits will improve Outcome: Progressing Goal: Will remain free from infection Outcome: Progressing Goal: Diagnostic test results will improve Outcome: Progressing Goal: Respiratory complications will improve Outcome: Progressing Goal: Cardiovascular complication will be avoided Outcome: Progressing   Problem: Activity: Goal: Risk for activity intolerance will decrease Outcome: Progressing   Problem: Nutrition: Goal: Adequate nutrition will be maintained Outcome: Progressing   Problem: Coping: Goal: Level of anxiety will decrease Outcome: Progressing   Problem: Elimination: Goal: Will not experience complications related to bowel motility Outcome: Progressing Goal: Will not experience complications related to urinary retention Outcome: Progressing   Problem: Pain Managment: Goal: General experience of comfort will improve Outcome: Progressing   Problem: Safety: Goal: Ability to remain free from injury will improve Outcome: Progressing   Problem: Skin Integrity: Goal: Risk for impaired skin integrity will decrease Outcome: Progressing   Problem: Education: Goal: Knowledge of risk factors and measures for prevention of condition will improve Outcome: Progressing   Problem: Coping: Goal: Psychosocial and spiritual needs will be supported Outcome: Progressing   Problem: Respiratory: Goal: Will maintain a patent airway Outcome: Progressing Goal: Complications related to the disease process, condition or treatment will be avoided or  minimized Outcome: Progressing   Problem: Education: Goal: Ability to demonstrate management of disease process will improve Outcome: Progressing Goal: Ability to verbalize understanding of medication therapies will improve Outcome: Progressing Goal: Individualized Educational Video(s) Outcome: Progressing   Problem: Activity: Goal: Capacity to carry out activities will improve Outcome: Progressing   Problem: Cardiac: Goal: Ability to achieve and maintain adequate cardiopulmonary perfusion will improve Outcome: Progressing   

## 2020-08-07 NOTE — Care Management Important Message (Signed)
Important Message  Patient Details  Name: Julia Hart MRN: [id number] Date of Birth: September 06, 1926   Medicare Important Message Given:  Yes - Important Message mailed due to current National Emergency     Tommy Medal 08/07/2020, 1:10 PM

## 2020-08-07 NOTE — Plan of Care (Signed)
Problem: Education: Goal: Knowledge of General Education information will improve Description: Including pain rating scale, medication(s)/side effects and non-pharmacologic comfort measures 08/07/2020 1311 by Melony Overly, RN Outcome: Adequate for Discharge 08/07/2020 1305 by Melony Overly, RN Outcome: Progressing   Problem: Health Behavior/Discharge Planning: Goal: Ability to manage health-related needs will improve 08/07/2020 1311 by Melony Overly, RN Outcome: Adequate for Discharge 08/07/2020 1305 by Melony Overly, RN Outcome: Progressing   Problem: Clinical Measurements: Goal: Ability to maintain clinical measurements within normal limits will improve 08/07/2020 1311 by Melony Overly, RN Outcome: Adequate for Discharge 08/07/2020 1305 by Melony Overly, RN Outcome: Progressing Goal: Will remain free from infection 08/07/2020 1311 by Melony Overly, RN Outcome: Adequate for Discharge 08/07/2020 1305 by Melony Overly, RN Outcome: Progressing Goal: Diagnostic test results will improve 08/07/2020 1311 by Melony Overly, RN Outcome: Adequate for Discharge 08/07/2020 1305 by Melony Overly, RN Outcome: Progressing Goal: Respiratory complications will improve 08/07/2020 1311 by Melony Overly, RN Outcome: Adequate for Discharge 08/07/2020 1305 by Melony Overly, RN Outcome: Progressing Goal: Cardiovascular complication will be avoided 08/07/2020 1311 by Melony Overly, RN Outcome: Adequate for Discharge 08/07/2020 1305 by Melony Overly, RN Outcome: Progressing   Problem: Activity: Goal: Risk for activity intolerance will decrease 08/07/2020 1311 by Melony Overly, RN Outcome: Adequate for Discharge 08/07/2020 1305 by Melony Overly, RN Outcome: Progressing   Problem: Nutrition: Goal: Adequate nutrition will be maintained 08/07/2020 1311 by Melony Overly, RN Outcome: Adequate for Discharge 08/07/2020 1305 by Melony Overly, RN Outcome: Progressing    Problem: Coping: Goal: Level of anxiety will decrease 08/07/2020 1311 by Melony Overly, RN Outcome: Adequate for Discharge 08/07/2020 1305 by Melony Overly, RN Outcome: Progressing   Problem: Elimination: Goal: Will not experience complications related to bowel motility 08/07/2020 1311 by Melony Overly, RN Outcome: Adequate for Discharge 08/07/2020 1305 by Melony Overly, RN Outcome: Progressing Goal: Will not experience complications related to urinary retention 08/07/2020 1311 by Melony Overly, RN Outcome: Adequate for Discharge 08/07/2020 1305 by Melony Overly, RN Outcome: Progressing   Problem: Pain Managment: Goal: General experience of comfort will improve 08/07/2020 1311 by Melony Overly, RN Outcome: Adequate for Discharge 08/07/2020 1305 by Melony Overly, RN Outcome: Progressing   Problem: Safety: Goal: Ability to remain free from injury will improve 08/07/2020 1311 by Melony Overly, RN Outcome: Adequate for Discharge 08/07/2020 1305 by Melony Overly, RN Outcome: Progressing   Problem: Skin Integrity: Goal: Risk for impaired skin integrity will decrease 08/07/2020 1311 by Melony Overly, RN Outcome: Adequate for Discharge 08/07/2020 1305 by Melony Overly, RN Outcome: Progressing   Problem: Education: Goal: Knowledge of risk factors and measures for prevention of condition will improve 08/07/2020 1311 by Melony Overly, RN Outcome: Adequate for Discharge 08/07/2020 1305 by Melony Overly, RN Outcome: Progressing   Problem: Coping: Goal: Psychosocial and spiritual needs will be supported 08/07/2020 1311 by Melony Overly, RN Outcome: Adequate for Discharge 08/07/2020 1305 by Melony Overly, RN Outcome: Progressing   Problem: Respiratory: Goal: Will maintain a patent airway 08/07/2020 1311 by Melony Overly, RN Outcome: Adequate for Discharge 08/07/2020 1305 by Melony Overly, RN Outcome: Progressing Goal: Complications related to the  disease process, condition or treatment will be avoided or minimized 08/07/2020 1311 by Melony Overly, RN Outcome: Adequate for Discharge 08/07/2020 1305 by Melony Overly, RN Outcome:  Progressing   Problem: Education: Goal: Ability to demonstrate management of disease process will improve 08/07/2020 1311 by Melony Overly, RN Outcome: Adequate for Discharge 08/07/2020 1305 by Melony Overly, RN Outcome: Progressing Goal: Ability to verbalize understanding of medication therapies will improve 08/07/2020 1311 by Melony Overly, RN Outcome: Adequate for Discharge 08/07/2020 1305 by Melony Overly, RN Outcome: Progressing Goal: Individualized Educational Video(s) 08/07/2020 1311 by Melony Overly, RN Outcome: Adequate for Discharge 08/07/2020 1305 by Melony Overly, RN Outcome: Progressing   Problem: Activity: Goal: Capacity to carry out activities will improve 08/07/2020 1311 by Melony Overly, RN Outcome: Adequate for Discharge 08/07/2020 1305 by Melony Overly, RN Outcome: Progressing   Problem: Cardiac: Goal: Ability to achieve and maintain adequate cardiopulmonary perfusion will improve 08/07/2020 1311 by Melony Overly, RN Outcome: Adequate for Discharge 08/07/2020 1305 by Melony Overly, RN Outcome: Progressing

## 2020-08-09 ENCOUNTER — Telehealth: Payer: Self-pay | Admitting: *Deleted

## 2020-08-09 ENCOUNTER — Telehealth: Payer: Self-pay | Admitting: General Practice

## 2020-08-09 NOTE — Telephone Encounter (Signed)
Patient niece called in. She was supposed to have givens capsule study today but patient was released from the hospital on Monday. Patient has restarted her eliquis 2.5mg  BID. She was not on this at her last OV. Do you still want to proceed with rescheduling patient capsule study Dr. Abbey Chatters? Hospital notes in epic Thanks

## 2020-08-09 NOTE — Telephone Encounter (Signed)
See note started by [facility] today and sent to Dr. Abbey Chatters.

## 2020-08-09 NOTE — Telephone Encounter (Signed)
I received a call from Tammy with Endo stating the patient is sick and was unable to come in for her ov.  Please reach out to the patient about rescheduling.

## 2020-08-16 NOTE — Telephone Encounter (Signed)
Yes please reschedule patient's capsule endoscopy.  Thank you

## 2020-08-17 NOTE — Telephone Encounter (Signed)
Called daughter. Pt scheduled for givens capsule study. Also had to r/s TCS. This has been scheduled for 3/1, am appt. Aware will mail new prep instructions with new pre-op/covid test appt. Called endo and made aware also

## 2020-08-22 ENCOUNTER — Ambulatory Visit: Payer: Medicare Other | Admitting: Nurse Practitioner

## 2020-09-01 ENCOUNTER — Other Ambulatory Visit (HOSPITAL_COMMUNITY): Payer: Medicare Other

## 2020-09-05 ENCOUNTER — Ambulatory Visit (HOSPITAL_COMMUNITY)
Admission: RE | Admit: 2020-09-05 | Discharge: 2020-09-05 | Disposition: A | Discharge status: Home / Self Care | Payer: Medicare Other | Attending: Internal Medicine | Admitting: Internal Medicine

## 2020-09-05 ENCOUNTER — Encounter (HOSPITAL_COMMUNITY)
Admission: RE | Disposition: A | Discharge status: Home / Self Care | Payer: Self-pay | Source: Home / Self Care | Attending: Internal Medicine

## 2020-09-05 DIAGNOSIS — N189 Chronic kidney disease, unspecified: Secondary | ICD-10-CM | POA: Insufficient documentation

## 2020-09-05 DIAGNOSIS — D631 Anemia in chronic kidney disease: Secondary | ICD-10-CM | POA: Diagnosis not present

## 2020-09-05 DIAGNOSIS — D649 Anemia, unspecified: Secondary | ICD-10-CM

## 2020-09-05 HISTORY — PX: GIVENS CAPSULE STUDY: SHX5432

## 2020-09-05 SURGERY — IMAGING PROCEDURE, GI TRACT, INTRALUMINAL, VIA CAPSULE

## 2020-09-06 ENCOUNTER — Ambulatory Visit: Payer: Medicare Other | Admitting: Nurse Practitioner

## 2020-09-07 ENCOUNTER — Encounter (HOSPITAL_COMMUNITY): Payer: Self-pay | Admitting: Internal Medicine

## 2020-09-13 NOTE — Op Note (Signed)
Small Bowel Givens Capsule Study Procedure date:  09/05/20  Referring Provider:  Dr. Jonelle Sports PCP:  Dr. [name], MD  Indication for procedure:  Anemia with previous EGD essentially unremarkable  Patient data:  Wt: 58.2 kg Ht: 5\' 6"  Waist: N/A  Findings:  Complete study. No active bleeding, melena, old blood, tumors, or masses. Several venules identified throughout the small bowel with no active bleeding or sequela or recent active bleeding. Early duodenum with hemosiderosis. No other concerning findings.  First Gastric image:  00:01:06 First Duodenal image: 01:09:27 First Ileo-Cecal Valve image: N/A First Cecal image: 05:02:09 Gastric Passage time: 01 h 08 m Small Bowel Passage time:  03 h 52 m  Summary & Recommendations: No obvious source for GI bleed. Several venules identified likely incidental finding. With essentially normal EGD would proceed with colonoscopy as planned. If no findings to explain anemia would likely benefit for hematology referral for anemia management if she needs to continue on antiplatelet/DOAC therapy per cardiology. Would need thorough risk/benefit discussion to explain possibility for further bleeding on these agents.   Thank you for allowing [place] to participate in the care of Sula Soda, DNP, AGNP-C Adult & Gerontological Nurse Practitioner [facility]

## 2020-09-14 ENCOUNTER — Encounter (HOSPITAL_COMMUNITY): Payer: Self-pay | Admitting: Emergency Medicine

## 2020-09-14 ENCOUNTER — Telehealth: Payer: Self-pay | Admitting: Internal Medicine

## 2020-09-14 ENCOUNTER — Emergency Department (HOSPITAL_COMMUNITY): Payer: Medicare Other

## 2020-09-14 ENCOUNTER — Inpatient Hospital Stay (HOSPITAL_COMMUNITY)
Admission: EM | Admit: 2020-09-14 | Discharge: 2020-09-19 | DRG: 291 | Disposition: A | Discharge status: Skilled Nursing Facility | Payer: Medicare Other | Attending: Family Medicine | Admitting: Family Medicine

## 2020-09-14 ENCOUNTER — Other Ambulatory Visit: Payer: Self-pay

## 2020-09-14 DIAGNOSIS — I2510 Atherosclerotic heart disease of native coronary artery without angina pectoris: Secondary | ICD-10-CM | POA: Diagnosis present

## 2020-09-14 DIAGNOSIS — I509 Heart failure, unspecified: Secondary | ICD-10-CM | POA: Diagnosis not present

## 2020-09-14 DIAGNOSIS — I482 Chronic atrial fibrillation, unspecified: Secondary | ICD-10-CM | POA: Diagnosis present

## 2020-09-14 DIAGNOSIS — Z955 Presence of coronary angioplasty implant and graft: Secondary | ICD-10-CM

## 2020-09-14 DIAGNOSIS — M7989 Other specified soft tissue disorders: Secondary | ICD-10-CM

## 2020-09-14 DIAGNOSIS — I5043 Acute on chronic combined systolic (congestive) and diastolic (congestive) heart failure: Secondary | ICD-10-CM | POA: Diagnosis present

## 2020-09-14 DIAGNOSIS — U071 COVID-19: Secondary | ICD-10-CM | POA: Diagnosis present

## 2020-09-14 DIAGNOSIS — E785 Hyperlipidemia, unspecified: Secondary | ICD-10-CM | POA: Diagnosis present

## 2020-09-14 DIAGNOSIS — I10 Essential (primary) hypertension: Secondary | ICD-10-CM | POA: Diagnosis present

## 2020-09-14 DIAGNOSIS — Z87891 Personal history of nicotine dependence: Secondary | ICD-10-CM

## 2020-09-14 DIAGNOSIS — Z8616 Personal history of COVID-19: Secondary | ICD-10-CM

## 2020-09-14 DIAGNOSIS — Z7902 Long term (current) use of antithrombotics/antiplatelets: Secondary | ICD-10-CM

## 2020-09-14 DIAGNOSIS — D509 Iron deficiency anemia, unspecified: Secondary | ICD-10-CM | POA: Diagnosis present

## 2020-09-14 DIAGNOSIS — Z7901 Long term (current) use of anticoagulants: Secondary | ICD-10-CM

## 2020-09-14 DIAGNOSIS — I25810 Atherosclerosis of coronary artery bypass graft(s) without angina pectoris: Secondary | ICD-10-CM | POA: Diagnosis present

## 2020-09-14 DIAGNOSIS — I130 Hypertensive heart and chronic kidney disease with heart failure and stage 1 through stage 4 chronic kidney disease, or unspecified chronic kidney disease: Principal | ICD-10-CM | POA: Diagnosis present

## 2020-09-14 DIAGNOSIS — N1831 Chronic kidney disease, stage 3a: Secondary | ICD-10-CM | POA: Diagnosis present

## 2020-09-14 DIAGNOSIS — Z66 Do not resuscitate: Secondary | ICD-10-CM | POA: Diagnosis present

## 2020-09-14 DIAGNOSIS — D649 Anemia, unspecified: Secondary | ICD-10-CM | POA: Diagnosis present

## 2020-09-14 DIAGNOSIS — N179 Acute kidney failure, unspecified: Secondary | ICD-10-CM | POA: Diagnosis present

## 2020-09-14 DIAGNOSIS — Z79899 Other long term (current) drug therapy: Secondary | ICD-10-CM

## 2020-09-14 DIAGNOSIS — M7120 Synovial cyst of popliteal space [Baker], unspecified knee: Secondary | ICD-10-CM | POA: Diagnosis present

## 2020-09-14 LAB — CBC WITH DIFFERENTIAL/PLATELET
Abs Immature Granulocytes: 0.03 10*3/uL (ref 0.00–0.07)
Basophils Absolute: 0 10*3/uL (ref 0.0–0.1)
Basophils Relative: 0 %
Eosinophils Absolute: 0 10*3/uL (ref 0.0–0.5)
Eosinophils Relative: 0 %
HCT: 29.9 % — ABNORMAL LOW (ref 36.0–46.0)
Hemoglobin: 9.8 g/dL — ABNORMAL LOW (ref 12.0–15.0)
Immature Granulocytes: 1 %
Lymphocytes Relative: 14 %
Lymphs Abs: 0.9 10*3/uL (ref 0.7–4.0)
MCH: 28.8 pg (ref 26.0–34.0)
MCHC: 32.8 g/dL (ref 30.0–36.0)
MCV: 87.9 fL (ref 80.0–100.0)
Monocytes Absolute: 1 10*3/uL (ref 0.1–1.0)
Monocytes Relative: 15 %
Neutro Abs: 4.5 10*3/uL (ref 1.7–7.7)
Neutrophils Relative %: 70 %
Platelets: 191 10*3/uL (ref 150–400)
RBC: 3.4 MIL/uL — ABNORMAL LOW (ref 3.87–5.11)
RDW: 14.7 % (ref 11.5–15.5)
WBC: 6.5 10*3/uL (ref 4.0–10.5)
nRBC: 0 % (ref 0.0–0.2)

## 2020-09-14 LAB — TROPONIN I (HIGH SENSITIVITY)
Troponin I (High Sensitivity): 294 ng/L (ref <18)
Troponin I (High Sensitivity): 253 ng/L (ref <18)

## 2020-09-14 LAB — COMPREHENSIVE METABOLIC PANEL
ALT: 23 U/L (ref 0–44)
AST: 58 U/L — ABNORMAL HIGH (ref 15–41)
Albumin: 3 g/dL — ABNORMAL LOW (ref 3.5–5.0)
Alkaline Phosphatase: 132 U/L — ABNORMAL HIGH (ref 38–126)
Anion gap: 11 (ref 5–15)
BUN: 49 mg/dL — ABNORMAL HIGH (ref 8–23)
CO2: 22 mmol/L (ref 22–32)
Calcium: 8.1 mg/dL — ABNORMAL LOW (ref 8.9–10.3)
Chloride: 100 mmol/L (ref 98–111)
Creatinine, Ser: 1.99 mg/dL — ABNORMAL HIGH (ref 0.44–1.00)
GFR, Estimated: 23 mL/min — ABNORMAL LOW (ref >60)
Glucose, Bld: 125 mg/dL — ABNORMAL HIGH (ref 70–99)
Potassium: 3.5 mmol/L (ref 3.5–5.1)
Sodium: 133 mmol/L — ABNORMAL LOW (ref 135–145)
Total Bilirubin: 1.1 mg/dL (ref 0.3–1.2)
Total Protein: 7.4 g/dL (ref 6.5–8.1)

## 2020-09-14 LAB — BRAIN NATRIURETIC PEPTIDE: B Natriuretic Peptide: 4227 pg/mL — ABNORMAL HIGH (ref 0.0–100.0)

## 2020-09-14 LAB — SARS CORONAVIRUS 2 (TAT 6-24 HRS): SARS Coronavirus 2: NEGATIVE

## 2020-09-14 MED ORDER — CLOPIDOGREL BISULFATE 75 MG PO TABS
75.0000 mg | ORAL_TABLET | Freq: Every day | ORAL | Status: DC
Start: 2020-09-14 — End: 2020-09-19
  Administered 2020-09-14 – 2020-09-19 (×6): 75 mg via ORAL
  Filled 2020-09-14 (×6): qty 1

## 2020-09-14 MED ORDER — AMLODIPINE BESYLATE 5 MG PO TABS
5.0000 mg | ORAL_TABLET | Freq: Every day | ORAL | Status: DC
  Administered 2020-09-15 – 2020-09-19 (×5): 5 mg via ORAL
  Filled 2020-09-14 (×5): qty 1

## 2020-09-14 MED ORDER — ONDANSETRON HCL 4 MG PO TABS: 4.0000 mg | ORAL_TABLET | Freq: Four times a day (QID) | ORAL | Status: DC | PRN

## 2020-09-14 MED ORDER — METOPROLOL SUCCINATE ER 25 MG PO TB24
25.0000 mg | ORAL_TABLET | Freq: Every day | ORAL | Status: DC
  Administered 2020-09-15 – 2020-09-19 (×5): 25 mg via ORAL
  Filled 2020-09-14 (×5): qty 1

## 2020-09-14 MED ORDER — APIXABAN 2.5 MG PO TABS
2.5000 mg | ORAL_TABLET | Freq: Two times a day (BID) | ORAL | Status: DC
  Administered 2020-09-14 – 2020-09-19 (×10): 2.5 mg via ORAL
  Filled 2020-09-14 (×10): qty 1

## 2020-09-14 MED ORDER — HYDRALAZINE HCL 25 MG PO TABS
50.0000 mg | ORAL_TABLET | Freq: Three times a day (TID) | ORAL | Status: DC
  Administered 2020-09-14 – 2020-09-19 (×13): 50 mg via ORAL
  Filled 2020-09-14 (×14): qty 2

## 2020-09-14 MED ORDER — ATORVASTATIN CALCIUM 20 MG PO TABS
20.0000 mg | ORAL_TABLET | Freq: Every day | ORAL | Status: DC
  Administered 2020-09-14 – 2020-09-18 (×5): 20 mg via ORAL
  Filled 2020-09-14 (×5): qty 1

## 2020-09-14 MED ORDER — PANTOPRAZOLE SODIUM 40 MG PO TBEC
40.0000 mg | DELAYED_RELEASE_TABLET | Freq: Every day | ORAL | Status: DC
  Administered 2020-09-15 – 2020-09-19 (×5): 40 mg via ORAL
  Filled 2020-09-14 (×5): qty 1

## 2020-09-14 MED ORDER — ACETAMINOPHEN 325 MG PO TABS
650.0000 mg | ORAL_TABLET | Freq: Four times a day (QID) | ORAL | Status: DC | PRN
  Administered 2020-09-14 – 2020-09-18 (×7): 650 mg via ORAL
  Filled 2020-09-14 (×7): qty 2

## 2020-09-14 MED ORDER — ISOSORBIDE DINITRATE 5 MG PO TABS
5.0000 mg | ORAL_TABLET | Freq: Two times a day (BID) | ORAL | Status: DC
  Administered 2020-09-14 – 2020-09-19 (×10): 5 mg via ORAL
  Filled 2020-09-14 (×13): qty 1

## 2020-09-14 MED ORDER — SPIRONOLACTONE 25 MG PO TABS
25.0000 mg | ORAL_TABLET | Freq: Every day | ORAL | Status: DC
  Administered 2020-09-15: 25 mg via ORAL
  Filled 2020-09-14: qty 1

## 2020-09-14 MED ORDER — POLYETHYLENE GLYCOL 3350 17 G PO PACK: 17.0000 g | PACK | Freq: Every day | ORAL | Status: DC | PRN

## 2020-09-14 MED ORDER — FUROSEMIDE 10 MG/ML IJ SOLN
40.0000 mg | Freq: Two times a day (BID) | INTRAMUSCULAR | Status: DC
  Administered 2020-09-15: 40 mg via INTRAVENOUS
  Filled 2020-09-14: qty 4

## 2020-09-14 MED ORDER — ISOSORBIDE DINITRATE 10 MG PO TABS
ORAL_TABLET | ORAL | Status: AC
  Filled 2020-09-14: qty 1

## 2020-09-14 MED ORDER — ACETAMINOPHEN 650 MG RE SUPP: 650.0000 mg | Freq: Four times a day (QID) | RECTAL | Status: DC | PRN

## 2020-09-14 MED ORDER — ONDANSETRON HCL 4 MG/2ML IJ SOLN: 4.0000 mg | Freq: Four times a day (QID) | INTRAMUSCULAR | Status: DC | PRN

## 2020-09-14 MED ORDER — FUROSEMIDE 10 MG/ML IJ SOLN
40.0000 mg | Freq: Once | INTRAMUSCULAR | Status: AC
  Administered 2020-09-14: 40 mg via INTRAVENOUS
  Filled 2020-09-14: qty 4

## 2020-09-14 NOTE — Discharge Instructions (Addendum)
1)Avoid ibuprofen/Advil/Aleve/Motrin/Goody Powders/Naproxen/BC powders/Meloxicam/Diclofenac/Indomethacin and other Nonsteroidal anti-inflammatory medications as these will make you more likely to bleed and can cause stomach ulcers, can also cause Kidney problems.   2) follow-up with gastroenterologist Dr Abbey Chatters --for colonoscopy as previously planned  3) repeat CBC and BMP on Friday, 09/22/2020  4) please note that there has been several changes to your medications--- you have decided to stop Plavix, you have decided to take aspirin for cardiovascular protection due to prior stent and CABG and you have also decided to take Eliquis/apixaban for stroke prevention due to atrial fibrillation  5) you will need to stop the iron tablets prior to your colonoscopy

## 2020-09-14 NOTE — TOC Initial Note (Signed)
Transition of Care [facility]) - Initial/Assessment Note    Patient Details  Name: Julia Hart MRN: [id number] Date of Birth: Jan 12, 1927  [facility] CM/SW Contact:    Iona Beard, [place] Phone Number: 09/14/2020, 8:26 PM  Clinical Narrative:                 TOC consulted due to CHF screen. CSW spoke to pts niece on the phone due to pt being hard of hearing and on COVID precautions. Pts nephew lives with her. Before getting sick pt is independent in completing ADLs. Pt does not drive but has transportation when needed. Pt has not had [place] services in the past. Pt has a cane and walker to use when needed.   CSW completed CHF screen with pt. Pt weighs herself daily. Pt does pretty good at following a heart healthy and low sodium diet. Pts medications are set out daily by niece and ensured that she takes them correctly. Pts niece states that pt is followed by Dr. Sabra Heck for cardiology.   Expected Discharge Plan: [place] Barriers to Discharge: Continued Medical Work up   Patient Goals and CMS Choice Patient states their goals for this hospitalization and ongoing recovery are:: Go home with [facility] if needed. CMS Medicare.gov Compare Post Acute Care list provided to:: Patient Choice offered to / list presented to : Patient  Expected Discharge Plan and Services Expected Discharge Plan: [place] In-house Referral: Clinical Social Work Discharge Planning Services: CM Consult Post Acute Care Choice: [place] arrangements for the past 2 months: Single Family Home                 DME Arranged: N/A DME Agency: NA                  Prior Living Arrangements/Services Living arrangements for the past 2 months: Single Family Home Lives with:: Relatives Patient language and need for interpreter reviewed:: Yes Do you feel safe going back to the place where you live?: Yes      Need for Family Participation in Patient Care: Yes  (Comment) Care giver support system in place?: Yes (comment) Current home services: DME Criminal Activity/Legal Involvement Pertinent to Current Situation/Hospitalization: No - Comment as needed  Activities of Daily Living      Permission Sought/Granted                  Emotional Assessment       Orientation: : Oriented to Self,Oriented to Place,Oriented to  Time,Oriented to Situation Alcohol / Substance Use: Not Applicable Psych Involvement: No (comment)  Admission diagnosis:  CHF exacerbation ([place]) [I50.9] AKI (acute kidney injury) ([place]) [N17.9] Acute on chronic congestive heart failure, unspecified heart failure type ([place]) [I50.9] Patient Active Problem List   Diagnosis Date Noted  . Acute on chronic systolic CHF (congestive heart failure) ([place]) 08/04/2020  . Decompensated heart failure ([place]) 08/03/2020  . Coronary artery disease 08/03/2020  . HTN (hypertension) 08/03/2020  . COVID-19 virus infection 08/03/2020  . Atrial fibrillation, chronic ([place]) 08/03/2020  . GERD (gastroesophageal reflux disease) 07/20/2020  . Melena 06/29/2020  . Abdominal pain 06/29/2020  . Loss of weight   . Anemia 12/19/2016  . Change in bowel habits 12/19/2016   PCP:  [name], MD Pharmacy:   [place], [place] [id number] [place] [address] Phone: [phone] Fax:  [phone]     Social Determinants of Health (SDOH) Interventions    Readmission Risk Interventions No flowsheet data found.

## 2020-09-14 NOTE — ED Provider Notes (Signed)
[facility] EMERGENCY DEPARTMENT Provider Note   CSN: [id number] Arrival date & time: 09/14/20  1208     History Chief Complaint  Patient presents with  . Leg Swelling    Julia Hart is a 87 y.o. female with PMHx HTN, HLD, CAD s/p stenting, A fib on Eliquis, CHF with EF 65-70%, and recent COVID infection last month who presents to the ED today with complaint of bilateral lower extremity swelling x 1 week. Pt denies missing any doses of any of her medications. She does not feel like she is more short of breath however appears tachypneic today. She denies chest pain. Pt unable to provide an additional history at this time.   Additional information obtained by niece - she reports that pt has been generally weak for several months now however for the past week has been complaining of bilateral leg heaviness and swelling. Pt was taken to her PCP yesterday and was started on jardiance for her fluid overload and discharged home. Niece also mentions that pt was in [facility] on Sunday and was admitted for CHF exacerbation. Niece believes she got a couple rounds of IV lasix however did not feel like it had changed her swelling whatsoever when she was discharged the next day. Niece provides all the medications for pt and states she has not missed any doses. Pt has not been complaining of any chest pain or SOB.   LHC 08/27/17: 1. Dominant RCA with large PDA and PL branches. There is an ostial 80% calcified stenosis and mid 30% stenosis. There is an occluded SVG jump graft from the   aorta to PDA-PL-OM-2-OM1-and first diagonal. The initial segment from the aorta to PDA is occluded but the remainder of the graft from the PDA to first diagonal   is patent.  2. 30% proximal LAD narrowing and no other disease distally. There is an atretic LIMA to the mid-distal LAD. The first diagonal has an ostial 90% stenosis with   patent jump graft from the OM-1 3. The first OM has a long, tubular 70% stenosis with  patent jump graft from OM-2. 4. The second OM has no significant disease.  5. There is a 90% mid circumflex stenosis that leads to a small third marginal. 6. Successful PCI of the ostial and proximal RCA initially with a cutting balloon followed by deployment of a 4.0x16m Onyx DES at the ostium and a 4.0x845min   the mid proximal segment to cover a ulcerated plaque both with excellent result.     The history is provided by the patient and medical records.       Past Medical History:  Diagnosis Date  . CAD (coronary artery disease)   . HTN (hypertension)   . Hyperlipidemia     Patient Active Problem List   Diagnosis Date Noted  . Acute on chronic systolic CHF (congestive heart failure) ([place]01/14/2022  . Decompensated heart failure (HCBang[place]01/13/2022  . Coronary artery disease 08/03/2020  . HTN (hypertension) 08/03/2020  . COVID-19 virus infection 08/03/2020  . Atrial fibrillation, chronic (HCR[place]01/13/2022  . GERD (gastroesophageal reflux disease) 07/20/2020  . Melena 06/29/2020  . Abdominal pain 06/29/2020  . Loss of weight   . Anemia 12/19/2016  . Change in bowel habits 12/19/2016    Past Surgical History:  Procedure Laterality Date  . ABDOMINAL HYSTERECTOMY    . BIOPSY  01/13/2017   Procedure: BIOPSY;  Surgeon: FiDanie BinderMD;  Location: [facility];  Service: Endoscopy;;  duodenal gastric   . COLONOSCOPY N/A 01/13/2017   Procedure: COLONOSCOPY;  Surgeon: Danie Binder, MD;  Location: [facility];  Service: Endoscopy;  Laterality: N/A;  3:00pm-moved to 1:45pm per Ginger  . CORONARY ANGIOPLASTY WITH STENT PLACEMENT  2018  . CORONARY ARTERY BYPASS GRAFT  2008  . ESOPHAGOGASTRODUODENOSCOPY N/A 01/13/2017   Procedure: ESOPHAGOGASTRODUODENOSCOPY (EGD);  Surgeon: Danie Binder, MD;  Location: [facility];  Service: Endoscopy;  Laterality: N/A;  . ESOPHAGOGASTRODUODENOSCOPY (EGD) WITH PROPOFOL N/A 06/29/2020   Procedure: ESOPHAGOGASTRODUODENOSCOPY (EGD) WITH  PROPOFOL;  Surgeon: Daneil Dolin, MD;  Location: [facility];  Service: Endoscopy;  Laterality: N/A;  1:45pm  . GIVENS CAPSULE STUDY N/A 09/05/2020   Procedure: GIVENS CAPSULE STUDY;  Surgeon: Eloise Harman, DO;  Location: [facility];  Service: Endoscopy;  Laterality: N/A;  7:30am     OB History   No obstetric history on file.     Family History  Problem Relation Age of Onset  . Colon cancer Neg Hx   . Colon polyps Neg Hx     Social History   Tobacco Use  . Smoking status: Former Research scientist (life sciences)  . Smokeless tobacco: Never Used  Vaping Use  . Vaping Use: Never used  Substance Use Topics  . Alcohol use: No  . Drug use: No    Home Medications Prior to Admission medications   Medication Sig Start Date End Date Taking? Authorizing Provider  amLODipine (NORVASC) 5 MG tablet Take 5 mg by mouth daily.    [provider]  apixaban (ELIQUIS) 2.5 MG TABS tablet Take 2.5 mg by mouth 2 (two) times daily.    [provider]  atorvastatin (LIPITOR) 20 MG tablet Take 20 mg by mouth at bedtime.     [provider]  clopidogrel (PLAVIX) 75 MG tablet Take 75 mg by mouth daily.    [provider]  docusate sodium (COLACE) 100 MG capsule Take 100 mg by mouth daily.     [provider]  ENTRESTO 97-103 MG Take 1 tablet by mouth 2 (two) times daily. 06/13/20   [provider]  guaiFENesin (MUCINEX) 600 MG 12 hr tablet Take 600 mg by mouth 2 (two) times daily.    [provider]  hydrALAZINE (APRESOLINE) 50 MG tablet Take 50 mg by mouth 3 (three) times daily.  10/29/19   [provider]  isosorbide dinitrate (ISORDIL) 5 MG tablet Take 5 mg by mouth 2 (two) times daily.  04/24/20   [provider]  metoprolol succinate (TOPROL-XL) 25 MG 24 hr tablet Take 25 mg by mouth daily.    [provider]  nitroGLYCERIN (NITROSTAT) 0.4 MG SL tablet Place 0.4 mg under the tongue every 5 (five) minutes as needed for chest  pain.    [provider]  pantoprazole (PROTONIX) 40 MG tablet Take 40 mg by mouth daily. 05/17/20   [provider]  prednisoLONE acetate (PRED FORTE) 1 % ophthalmic suspension Place 1 drop into both eyes every other day. 03/15/20   [provider]  spironolactone (ALDACTONE) 25 MG tablet Take 25 mg by mouth daily.  05/16/20   [provider]  torsemide (DEMADEX) 20 MG tablet Take 1 tablet (20 mg total) by mouth daily. 08/07/20   [name], MD    Allergies    Patient has no known allergies.  Review of Systems   Review of Systems  Constitutional: Negative for chills and fever.  Respiratory: Negative for cough and  shortness of breath.   Cardiovascular: Positive for leg swelling. Negative for chest pain and palpitations.  All other systems reviewed and are negative.   Physical Exam Updated Vital Signs BP 137/79 (BP Location: Right Arm)   Pulse 75   Temp 98.5 F (36.9 C) (Oral)   Resp (!) 23   Ht _0  (1.676 m)   Wt 54.9 kg   SpO2 97%   BMI 19.53 kg/m   Physical Exam Vitals and nursing note reviewed.  Constitutional:      Appearance: She is not ill-appearing or diaphoretic.  HENT:     Head: Normocephalic and atraumatic.  Eyes:     Conjunctiva/sclera: Conjunctivae normal.  Cardiovascular:     Rate and Rhythm: Normal rate and regular rhythm.     Pulses: Normal pulses.  Pulmonary:     Effort: Tachypnea present.     Breath sounds: Wheezing present. No rhonchi or rales.     Comments: Speaking in shorter sentences with tachypnea noted. No accessory muscle use. Lungs with rales diffusely and end expiratory wheeze noted in the LLL. Satting 97% on RA.  Abdominal:     Palpations: Abdomen is soft.     Tenderness: There is no abdominal tenderness. There is no guarding or rebound.  Musculoskeletal:     Cervical back: Neck supple.     Right lower leg: Edema present.     Left lower leg: Edema present.     Comments: 1+ pitting edema bilaterally    Skin:    General: Skin is warm and dry.  Neurological:     Mental Status: She is alert and oriented to person, place, and time.     ED Results / Procedures / Treatments   Labs (all labs ordered are listed, but only abnormal results are displayed) Labs Reviewed  COMPREHENSIVE METABOLIC PANEL - Abnormal; Notable for the following components:      Result Value   Sodium 133 (*)    Glucose, Bld 125 (*)    BUN 49 (*)    Creatinine, Ser 1.99 (*)    Calcium 8.1 (*)    Albumin 3.0 (*)    AST 58 (*)    Alkaline Phosphatase 132 (*)    GFR, Estimated 23 (*)    All other components within normal limits  CBC WITH DIFFERENTIAL/PLATELET - Abnormal; Notable for the following components:   RBC 3.40 (*)    Hemoglobin 9.8 (*)    HCT 29.9 (*)    All other components within normal limits  BRAIN NATRIURETIC PEPTIDE - Abnormal; Notable for the following components:   B Natriuretic Peptide 4,227.0 (*)    All other components within normal limits  TROPONIN I (HIGH SENSITIVITY) - Abnormal; Notable for the following components:   Troponin I (High Sensitivity) 294 (*)    All other components within normal limits  SARS CORONAVIRUS 2 (TAT 6-24 HRS)  URINALYSIS, ROUTINE W REFLEX MICROSCOPIC  TROPONIN I (HIGH SENSITIVITY)    EKG EKG Interpretation  Date/Time:  Thursday September 14 2020 13:56:51 EST Ventricular Rate:  79 PR Interval:    QRS Duration: 100 QT Interval:  405 QTC Calculation: 465 R Axis:   -164 Text Interpretation: Unknown rhythm, irregular rate Abnormal R-wave progression, late transition Inferior infarct, old Baseline wander in lead(s) I III aVL Confirmed by Fredia Sorrow [phone]) on 09/14/2020 2:00:11 PM   Radiology DG Chest Port 1 View  Result Date: 09/14/2020 CLINICAL DATA:  Leg swelling EXAM: PORTABLE CHEST 1 VIEW COMPARISON:  Portable  exam 1322 hours compared to 08/03/2020 FINDINGS: And mild enlargement of cardiac silhouette post CABG. Mediastinal contours and pulmonary  vascularity normal. Atherosclerotic calcification aorta. Lungs clear. No infiltrate, pleural effusion, or pneumothorax. Bones demineralized. Probable RIGHT nipple shadow. Skin folds project over RIGHT lung. IMPRESSION: Enlargement of cardiac silhouette post CABG. No acute abnormalities. Aortic Atherosclerosis (ICD10-I70.0). Electronically Signed   By: Lavonia Dana M.D.   On: 09/14/2020 14:23   CXR read by Dr. Thornton Papas; no acute abnormalities noted.   Procedures Procedures   Medications Ordered in ED Medications  furosemide (LASIX) injection 40 mg (40 mg Intravenous Given 09/14/20 1443)    ED Course  I have reviewed the triage vital signs and the nursing notes.  Pertinent labs & imaging results that were available during my care of the patient were reviewed by me and considered in my medical decision making (see chart for details).  Clinical Course as of 09/14/20 1536  Thu Sep 14, 2020  1405 B Natriuretic Peptide(!): 4,227.0 [MV]  1453 Discussed case with cardiologist Dr. Timmothy Sours who suspects acute myocardial injury in the setting of acute decompensated heart failure. Recommends treating main problem of CHF at this time with IV diuresis  [MV]    Clinical Course User Index [MV] Eustaquio Maize, PA-C   MDM Rules/Calculators/A&P                          87 year old female with history of HFpEF presents to the ED today complaining of worsening bilateral leg swelling for the past week, denies missing any doses of her torsemide or spironolactone.  She denies any shortness of breath however does appear to be working slightly harder to breathe and speaking in shorter sentences.  She does have diffuse rales throughout her lung fields with end expiratory wheezes in the left lower lung field.  It is noted that she recently had Covid last month which was found during an admission for CHF exacerbation.  She is on Eliquis.  She is nontachycardic on exam and denies any chest pain or shortness of breath, low  suspicion for PE at this time.  Remainder of vitals stable, patient is afebrile and satting 97% on room air.  We'll plan to work-up for CHF exacerbation at this time with chest x-ray, EKG, lab work including BNP and troponin.  Will plan for IV Lasix once creatinine and BNP return.  Portable chest x-ray was obtained, evaluated by myself at bedside.  Has been read by radiology consult Dr. Darcella Cheshire who evaluated images however unfortunately PACS is down at this time.  He reads that there are no acute abnormalities today.   CBC without leukocytosis. Hgb stable at 9.8  Hemoglobin  Date Value Ref Range Status  09/14/2020 9.8 (L) 12.0 - 15.0 g/dL Final  08/07/2020 9.5 (L) 12.0 - 15.0 g/dL Final  08/03/2020 9.2 (L) 12.0 - 15.0 g/dL Final  07/20/2020 9.6 (L) 11.1 - 15.9 g/dL Final  06/27/2020 9.0 (L) 12.0 - 15.0 g/dL Final   BNP significantly elevated at 4,227.0 (3,434.0 last month when admitted). Will await kidney function prior to IV lasix  Troponin elevated 294. Pt without complaints of chest pain. Will consult cardiology at this time.   Cardiology aware of patient; see ED course above.   CMP with creatinine 1.99 and BUN 49. Sodium 133. Alk phos elevated 132 and AST 58; pt without abdominal TTP on exam. Will provide IV lasix at this time and call for admission.   Discussed  case with [facility] Dr. Denton Brick who agrees to evaluate patient for admission.   This note was prepared using Dragon voice recognition software and may include unintentional dictation errors due to the inherent limitations of voice recognition software.   Final Clinical Impression(s) / ED Diagnoses Final diagnoses:  Acute on chronic congestive heart failure, unspecified heart failure type ([place])  AKI (acute kidney injury) [facility])    Rx / DC Orders ED Discharge Orders    None       Eustaquio Maize, PA-C 09/14/20 1536    Fredia Sorrow, MD 09/27/20 1649

## 2020-09-14 NOTE — ED Provider Notes (Signed)
I provided a substantive portion of the care of this patient.  I personally performed the entirety of the exam and medical decision making for this encounter.  EKG Interpretation  Date/Time:  Thursday September 14 2020 13:56:51 EST Ventricular Rate:  79 PR Interval:    QRS Duration: 100 QT Interval:  405 QTC Calculation: 465 R Axis:   -164 Text Interpretation: Unknown rhythm, irregular rate Abnormal R-wave progression, late transition Inferior infarct, old Baseline wander in lead(s) I III aVL Confirmed by Fredia Sorrow [phone]) on 09/14/2020 2:00:11 PM   Patient seen by me along with the physician assistant.  Patient brought in by POV.  Apparently for bilateral leg edema for a week.  Patient supposed has a history of CHF.  Admitted back in January for this.  On January 13.  Patient had Covid at that time and also had CHF.  Patient denies any chest pain.  Patient very poor historian.  Has really no complaints here.  Patient with marked bilateral leg swelling.  Sounds congested.  Chest nontender.  Abdomen soft nontender.  Covid test was ordered but may very well be positive since patient was positive for Covid back in January.  Labs no leukocytosis.  Hemoglobin down a little bit at 9.8.  CMP significant for elevated BUN and creatinine almost doubled from baseline.  So there is evidence of acute kidney injury.  Sodium also a little low at 133.  Patient is on furosemide for her CHF.  No significant lab findings is that the initial troponin was 294.  And BNP was 4227.  However official read on the initial chest x-ray was normal no acute findings except for enlargement of the cardiac silhouette.  Patient will require admission for the leg edema acute kidney injury elevated troponin and elevated BNP.  Delta troponin is necessary.   Fredia Sorrow, MD 09/14/20 1451

## 2020-09-14 NOTE — ED Triage Notes (Addendum)
Pt c/o bilateral lower leg edema for the past week. Know history of CHF. Last hospitalized 08/03/20 for Covid/CHF.  Pt is a poor historian.

## 2020-09-14 NOTE — ED Notes (Signed)
Date and time results received: 09/14/20 1417 (use smartphrase ".now" to insert current time)  Test: Trop Critical Value: 294  Name of Provider Notified: Alroy Bailiff, [place]  Orders Received? Or Actions Taken?: see chart

## 2020-09-14 NOTE — ED Notes (Signed)
Date and time results received: 09/14/20 1749 (use smartphrase ".now" to insert current time)  Test: trop Critical Value: 253  Name of Provider Notified: M. Alroy Bailiff, [place]  Orders Received? Or Actions Taken?: na

## 2020-09-14 NOTE — H&P (Addendum)
History and Physical    Julia Hart:[id number] DOB: [birth year] DOA: 09/14/2020  PCP: [name], MD   Patient coming from: Home  I have personally briefly reviewed patient's old medical records in [place]  Chief Complaint: Leg swelling  HPI: Julia Hart is a 87 y.o. female with medical history significant for congestive heart failure, atrial fibrillation, hypertension, recent Covid infection. Patient was brought to the ED with reports of bilateral lower extremity swelling of 1 week duration.  She denies difficulty breathing, no orthopnea, no chest pain.  Nephew is present at bedside.  He reports patient has caregivers at home and patient has been very compliant with all her medications which include torsemide 20mg  daily.  Hospitalization here 1/13 - 1/17 for decompensated CHF and COVID-19 infection.  Was diuresed with IV Lasix, and discharged home with change in her diuretic dose from 20 mg of torsemide every other day to daily.   Patient was also just hospitalized at [facility] 2/20 - 2/21, for same, but nephew said patient swelling did not improve after hospitalization.  ED Course: Stable vitals.  O2 sats greater than 96% on room air.  BNP markedly elevated at 4227, troponin II 194, creatinine elevated 1.99.  Portable chest x-ray without acute abnormality.  40 mg IV Lasix given.  Hospitalist to admit for decompensated CHF.  Review of Systems: As per HPI all other systems reviewed and negative.  Past Medical History:  Diagnosis Date  . CAD (coronary artery disease)   . HTN (hypertension)   . Hyperlipidemia     Past Surgical History:  Procedure Laterality Date  . ABDOMINAL HYSTERECTOMY    . BIOPSY  01/13/2017   Procedure: BIOPSY;  Surgeon: Danie Binder, MD;  [facility];  Service: Endoscopy;;  duodenal gastric   . COLONOSCOPY N/A 01/13/2017   Procedure: COLONOSCOPY;  Surgeon: Danie Binder, MD;  [facility];  Service: Endoscopy;   Laterality: N/A;  3:00pm-moved to 1:45pm per [place]  . CORONARY ANGIOPLASTY WITH STENT PLACEMENT  2018  . CORONARY ARTERY BYPASS GRAFT  2008  . ESOPHAGOGASTRODUODENOSCOPY N/A 01/13/2017   Procedure: ESOPHAGOGASTRODUODENOSCOPY (EGD);  Surgeon: Danie Binder, MD;  [facility];  Service: Endoscopy;  Laterality: N/A;  . ESOPHAGOGASTRODUODENOSCOPY (EGD) WITH PROPOFOL N/A 06/29/2020   Procedure: ESOPHAGOGASTRODUODENOSCOPY (EGD) WITH PROPOFOL;  Surgeon: Daneil Dolin, MD;  [facility];  Service: Endoscopy;  Laterality: N/A;  1:45pm  . GIVENS CAPSULE STUDY N/A 09/05/2020   Procedure: GIVENS CAPSULE STUDY;  Surgeon: Eloise Harman, DO;  Location: [facility];  Service: Endoscopy;  Laterality: N/A;  7:30am     reports that she has quit smoking. She has never used smokeless tobacco. She reports that she does not drink alcohol and does not use drugs.  No Known Allergies  Family History  Problem Relation Age of Onset  . Colon cancer Neg Hx   . Colon polyps Neg Hx     Prior to Admission medications   Medication Sig Start Date End Date Taking? Authorizing Provider  amLODipine (NORVASC) 5 MG tablet Take 5 mg by mouth daily.    [provider]  apixaban (ELIQUIS) 2.5 MG TABS tablet Take 2.5 mg by mouth 2 (two) times daily.    [provider]  atorvastatin (LIPITOR) 20 MG tablet Take 20 mg by mouth at bedtime.     [provider]  clopidogrel (PLAVIX) 75 MG tablet Take 75 mg by mouth daily.  [provider]  docusate sodium (COLACE) 100 MG capsule Take 100 mg by mouth daily.     [provider]  ENTRESTO 97-103 MG Take 1 tablet by mouth 2 (two) times daily. 06/13/20   [provider]  guaiFENesin (MUCINEX) 600 MG 12 hr tablet Take 600 mg by mouth 2 (two) times daily.    [provider]  hydrALAZINE (APRESOLINE) 50 MG tablet Take 50 mg by mouth 3 (three) times daily.  10/29/19   [provider]   isosorbide dinitrate (ISORDIL) 5 MG tablet Take 5 mg by mouth 2 (two) times daily.  04/24/20   [provider]  metoprolol succinate (TOPROL-XL) 25 MG 24 hr tablet Take 25 mg by mouth daily.    [provider]  nitroGLYCERIN (NITROSTAT) 0.4 MG SL tablet Place 0.4 mg under the tongue every 5 (five) minutes as needed for chest pain.    [provider]  pantoprazole (PROTONIX) 40 MG tablet Take 40 mg by mouth daily. 05/17/20   [provider]  prednisoLONE acetate (PRED FORTE) 1 % ophthalmic suspension Place 1 drop into both eyes every other day. 03/15/20   [provider]  spironolactone (ALDACTONE) 25 MG tablet Take 25 mg by mouth daily.  05/16/20   [provider]  torsemide (DEMADEX) 20 MG tablet Take 1 tablet (20 mg total) by mouth daily. 08/07/20   [name], MD    Physical Exam: Vitals:   09/14/20 1345 09/14/20 1400 09/14/20 1430 09/14/20 1500  BP: (!) 138/48 (!) 137/55 (!) 137/48 (!) 139/44  Pulse: 76 79 73 76  Resp: 19 (!) 25 (!) 21 (!) 23  Temp:      TempSrc:      SpO2: 96% 97% 97% 97%  Weight:      Height:        Constitutional: NAD, calm, comfortable Vitals:   09/14/20 1345 09/14/20 1400 09/14/20 1430 09/14/20 1500  BP: (!) 138/48 (!) 137/55 (!) 137/48 (!) 139/44  Pulse: 76 79 73 76  Resp: 19 (!) 25 (!) 21 (!) 23  Temp:      TempSrc:      SpO2: 96% 97% 97% 97%  Weight:      Height:       Eyes:  lids and conjunctivae normal ENMT: Mucous membranes are moist.   Neck: normal, supple, no masses, no thyromegaly Respiratory: Mild diffuse rhonchi, Normal respiratory effort. No accessory muscle use.  Cardiovascular: Regular rate and rhythm, no murmurs / rubs / gallops.  1+ pitting extremity edema to knees.  Lower extremities warm and well perfused. Abdomen: no tenderness, no masses palpated. No hepatosplenomegaly. Bowel sounds positive.  Musculoskeletal: no clubbing / cyanosis. No joint deformity upper and lower  extremities. Good ROM, no contractures. Normal muscle tone.  Skin: no rashes, lesions, ulcers. No induration Neurologic: No apparent cranial abnormality, moving extremities spontaneously. Psychiatric: Normal judgment and insight. Alert and oriented x 3. Normal mood.   Labs on Admission: I have personally reviewed following labs and imaging studies  CBC: Recent Labs  Lab 09/14/20 1307  WBC 6.5  NEUTROABS 4.5  HGB 9.8*  HCT 29.9*  MCV 87.9  PLT 426   Basic Metabolic Panel: Recent Labs  Lab 09/14/20 1307  NA 133*  K 3.5  CL 100  CO2 22  GLUCOSE 125*  BUN 49*  CREATININE 1.99*  CALCIUM 8.1*   GFR: Estimated Creatinine Clearance: 16 mL/min (A) (by C-G formula based on SCr of 1.99 mg/dL (H)). Liver  Function Tests: Recent Labs  Lab 09/14/20 1307  AST 58*  ALT 23  ALKPHOS 132*  BILITOT 1.1  PROT 7.4  ALBUMIN 3.0*    Radiological Exams on Admission: DG Chest Port 1 View  Result Date: 09/14/2020 CLINICAL DATA:  Leg swelling EXAM: PORTABLE CHEST 1 VIEW COMPARISON:  Portable exam 1322 hours compared to 08/03/2020 FINDINGS: And mild enlargement of cardiac silhouette post CABG. Mediastinal contours and pulmonary vascularity normal. Atherosclerotic calcification aorta. Lungs clear. No infiltrate, pleural effusion, or pneumothorax. Bones demineralized. Probable RIGHT nipple shadow. Skin folds project over RIGHT lung. IMPRESSION: Enlargement of cardiac silhouette post CABG. No acute abnormalities. Aortic Atherosclerosis (ICD10-I70.0). Electronically Signed   By: Lavonia Dana M.D.   On: 09/14/2020 14:23    EKG: Independently reviewed.   Assessment/Plan Principal Problem:   Decompensated heart failure (HCC) Active Problems:   Anemia   Coronary artery disease   COVID-19 virus infection   Atrial fibrillation, chronic (HCC)   Acute on chronic systolic congestive heart failure- on room air last echo 07/2020- EF 65- 70 %, G2DD. Bilateral lower extremity edema. BNP markedly  elevated at 4227.  Patient's [facility] at [facility].  Weights per chart unreliable. Home medications torsemide 20mg   -IV Lasix 40 mg twice daily -Strict input output, daily weights -BMP daily -Resume spironolactone.  -Hold home torsemide 20 mg daily,  AKI on CKD 3A- creatinine 1.9, baseline  ~1.1.  Likely cardiorenal. -Monitor with diuresis -Hold Entresto with AKI  Recent COVID infection- 1/13. Treated with remdesivir.   Atrial fibrillation-rate controlled and on chronic anticoagulation with Eliquis. - Resume Eliquis, confirmed -Resume metoprolol   Coronary artery disease-history of CABG 2008, and stent placement 2018. -Resume Plavix, Imdur, metoprolol ( Patient is on Plavix and eliquis).  HTN- Stable - resume Norvasc, hydralazine, Imdur, metoprolol, spironolactone  - PRN Hydralazine for systolic greater than 940.  DVT prophylaxis: Eliquis Code Status: DNR, confirmed with patient's niece Hassan Rowan on the phone. Family Communication: Spoke to patient's niece Hassan Rowan on the phone, and patient's nephew at bedside. Disposition Plan: ~ 2 days Consults called: None Admission status: Obs, tele   Bethena Roys MD [facility]  09/14/2020, 5:28 PM

## 2020-09-14 NOTE — Telephone Encounter (Signed)
Pt's daughter called to say that patient was in the ER with a lot of fluid build up and will probably be admitted. She is wanting to cancel her procedure with Dr Abbey Chatters for March 1. [phone]

## 2020-09-14 NOTE — ED Notes (Signed)
Assisted pt on bedpan for BM, assisted pt off the bedpan and pericare done.

## 2020-09-14 NOTE — Telephone Encounter (Signed)
Called daughter. She states patient has a lot of fluid build up and is at the ER now. Patient unable to do stand/walk d/t fluid. She did not want to r/s procedure at this time. FYI to Dr. Abbey Chatters.

## 2020-09-15 ENCOUNTER — Other Ambulatory Visit (HOSPITAL_COMMUNITY): Payer: Medicare Other

## 2020-09-15 ENCOUNTER — Encounter (HOSPITAL_COMMUNITY): Admission: RE | Admit: 2020-09-15 | Payer: Medicare Other | Source: Ambulatory Visit

## 2020-09-15 DIAGNOSIS — Z7901 Long term (current) use of anticoagulants: Secondary | ICD-10-CM | POA: Diagnosis not present

## 2020-09-15 DIAGNOSIS — I2510 Atherosclerotic heart disease of native coronary artery without angina pectoris: Secondary | ICD-10-CM | POA: Diagnosis present

## 2020-09-15 DIAGNOSIS — Z955 Presence of coronary angioplasty implant and graft: Secondary | ICD-10-CM | POA: Diagnosis not present

## 2020-09-15 DIAGNOSIS — I130 Hypertensive heart and chronic kidney disease with heart failure and stage 1 through stage 4 chronic kidney disease, or unspecified chronic kidney disease: Secondary | ICD-10-CM | POA: Diagnosis present

## 2020-09-15 DIAGNOSIS — Z79899 Other long term (current) drug therapy: Secondary | ICD-10-CM | POA: Diagnosis not present

## 2020-09-15 DIAGNOSIS — Z87891 Personal history of nicotine dependence: Secondary | ICD-10-CM | POA: Diagnosis not present

## 2020-09-15 DIAGNOSIS — I509 Heart failure, unspecified: Secondary | ICD-10-CM

## 2020-09-15 DIAGNOSIS — I25810 Atherosclerosis of coronary artery bypass graft(s) without angina pectoris: Secondary | ICD-10-CM | POA: Diagnosis present

## 2020-09-15 DIAGNOSIS — D649 Anemia, unspecified: Secondary | ICD-10-CM | POA: Diagnosis present

## 2020-09-15 DIAGNOSIS — Z8616 Personal history of COVID-19: Secondary | ICD-10-CM | POA: Diagnosis not present

## 2020-09-15 DIAGNOSIS — I5043 Acute on chronic combined systolic (congestive) and diastolic (congestive) heart failure: Secondary | ICD-10-CM | POA: Diagnosis present

## 2020-09-15 DIAGNOSIS — I482 Chronic atrial fibrillation, unspecified: Secondary | ICD-10-CM | POA: Diagnosis present

## 2020-09-15 DIAGNOSIS — N179 Acute kidney failure, unspecified: Secondary | ICD-10-CM | POA: Diagnosis present

## 2020-09-15 DIAGNOSIS — E785 Hyperlipidemia, unspecified: Secondary | ICD-10-CM | POA: Diagnosis present

## 2020-09-15 DIAGNOSIS — M7120 Synovial cyst of popliteal space [Baker], unspecified knee: Secondary | ICD-10-CM | POA: Diagnosis present

## 2020-09-15 DIAGNOSIS — Z7902 Long term (current) use of antithrombotics/antiplatelets: Secondary | ICD-10-CM | POA: Diagnosis not present

## 2020-09-15 DIAGNOSIS — Z66 Do not resuscitate: Secondary | ICD-10-CM | POA: Diagnosis present

## 2020-09-15 DIAGNOSIS — N1831 Chronic kidney disease, stage 3a: Secondary | ICD-10-CM | POA: Diagnosis present

## 2020-09-15 DIAGNOSIS — D509 Iron deficiency anemia, unspecified: Secondary | ICD-10-CM | POA: Diagnosis present

## 2020-09-15 LAB — BASIC METABOLIC PANEL
Anion gap: 11 (ref 5–15)
BUN: 49 mg/dL — ABNORMAL HIGH (ref 8–23)
CO2: 23 mmol/L (ref 22–32)
Calcium: 8.1 mg/dL — ABNORMAL LOW (ref 8.9–10.3)
Chloride: 103 mmol/L (ref 98–111)
Creatinine, Ser: 1.83 mg/dL — ABNORMAL HIGH (ref 0.44–1.00)
GFR, Estimated: 26 mL/min — ABNORMAL LOW (ref >60)
Glucose, Bld: 103 mg/dL — ABNORMAL HIGH (ref 70–99)
Potassium: 3.1 mmol/L — ABNORMAL LOW (ref 3.5–5.1)
Sodium: 137 mmol/L (ref 135–145)

## 2020-09-15 MED ORDER — POTASSIUM CHLORIDE CRYS ER 20 MEQ PO TBCR
40.0000 meq | EXTENDED_RELEASE_TABLET | ORAL | Status: AC
Start: 2020-09-15 — End: 2020-09-15
  Administered 2020-09-15: 40 meq via ORAL
  Filled 2020-09-15 (×2): qty 2

## 2020-09-15 MED ORDER — FUROSEMIDE 10 MG/ML IJ SOLN
40.0000 mg | Freq: Every day | INTRAMUSCULAR | Status: DC
  Administered 2020-09-16 – 2020-09-17 (×2): 40 mg via INTRAVENOUS
  Filled 2020-09-15 (×4): qty 4

## 2020-09-15 MED ORDER — POTASSIUM CHLORIDE CRYS ER 20 MEQ PO TBCR
40.0000 meq | EXTENDED_RELEASE_TABLET | ORAL | Status: AC
  Administered 2020-09-15 (×2): 40 meq via ORAL
  Filled 2020-09-15: qty 2

## 2020-09-15 NOTE — Progress Notes (Signed)
Patient Demographics:    Julia Hart, is a 87 y.o. female, DOB - 11/28/1926, XIH:[id number]  Admit date - 09/14/2020   Admitting Physician Emmanuella Mirante Denton Brick, MD  Outpatient Primary MD for the patient is [name], MD  LOS - 0   Chief Complaint  Patient presents with  . Leg Swelling        Subjective:    [address] today has no fevers, no emesis,  No chest pain,   -Shortness of breath persist, -Leg swelling and tightness persist  Assessment  & Plan :    Principal Problem:   Decompensated heart failure (HCC) Active Problems:   Anemia   Coronary artery disease   COVID-19 virus infection   Atrial fibrillation, chronic (HCC)   Acute exacerbation of CHF (congestive heart failure) (HCC)  Brief Summary:- 87 y.o. female with medical history significant for congestive heart failure, atrial fibrillation, hypertension, recent Covid infection admitted on 09/14/2020 with acute on chronic CHF exacerbation.  A/p 1)HFpEF--acute on chronic combined systolic and diastolic dysfunction CHF exacerbation -Fracture improved significantly and now -Echo from 08/04/2020 with EF of 65 to 70% with grade 2 diastolic dysfunction -Continue IV Lasix monitor fluid input and output closely -Hold Entresto on hold Aldactone due to worsening renal failure  2) chronic atrial fibrillation--- continue Eliquis for stroke prophylaxis and metoprolol for rate control  3)AKI----acute kidney injury on CKD stage -3A -hold Entresto and Aldactone, continue to monitor renal function closely with IV diuresis -Creatinine is 1.83, it was 1.9 on admission, baseline usually around 1.1 - renally adjust medications, avoid nephrotoxic agents / dehydration  / hypotension  4)HTN--- watch BP closely with medication adjustments, okay to continue amlodipine, metoprolol, and isosorbide, patient is on IV Lasix for now Entresto and Aldactone are  on hold 5)CAD-stable, chest pain-free, continue isosorbide, metoprolol Plavix and Lipitor  6)Recent COVID-19 infection--- diagnosed August 03, 2020 -Previously treated and appears to be largely asymptomatic from a COVID-19 infection standpoint at this time  7) generalized weakness and deconditioning--- await PT eval  Disposition/Need for in-Hospital Stay- patient unable to be discharged at this time due to -acute CHF exacerbation requiring IV diuresis and close monitoring of renal function in the setting of AKI on CKD*  Status is: Inpatient  Remains inpatient appropriate because:Please see above   Disposition: The patient is from: Home              Anticipated d/c is to: Home              Anticipated d/c date is: 2 days              Patient currently is not medically stable to d/c. Barriers: Not Clinically Stable-   Code Status :  -  Code Status: DNR   Family Communication:    NA (patient is alert, awake and coherent)   Consults  :    DVT Prophylaxis  :   - SCDs   apixaban (ELIQUIS) tablet 2.5 mg Start: 09/14/20 2200 apixaban (ELIQUIS) tablet 2.5 mg    Lab Results  Component Value Date   PLT 191 09/14/2020    Inpatient Medications  Scheduled Meds: . amLODipine  5 mg Oral Daily  . apixaban  2.5 mg Oral BID  .  atorvastatin  20 mg Oral QHS  . clopidogrel  75 mg Oral Daily  . [START ON 09/16/2020] furosemide  40 mg Intravenous Daily  . hydrALAZINE  50 mg Oral TID  . isosorbide dinitrate  5 mg Oral BID  . metoprolol succinate  25 mg Oral Daily  . pantoprazole  40 mg Oral Daily  . spironolactone  25 mg Oral Daily   Continuous Infusions: PRN Meds:.acetaminophen **OR** acetaminophen, ondansetron **OR** ondansetron (ZOFRAN) IV, polyethylene glycol    Anti-infectives (From admission, onward)   None        Objective:   Vitals:   09/15/20 0137 09/15/20 0546 09/15/20 1008 09/15/20 1430  BP: (!) 118/44 139/63 (!) 140/39 (!) 104/41  Pulse: 61 67 80 (!) 57  Resp: 16  19 20    Temp: (!) 97.5 F (36.4 C) (!) 97.1 F (36.2 C)  97.7 F (36.5 C)  TempSrc:    Oral  SpO2: 95% 100% 100% 98%  Weight:      Height:        Wt Readings from Last 3 Encounters:  09/14/20 54.9 kg  08/07/20 55.4 kg  07/20/20 58.2 kg     Intake/Output Summary (Last 24 hours) at 09/15/2020 1825 Last data filed at 09/15/2020 0500 Gross per 24 hour  Intake 240 ml  Output 500 ml  Net -260 ml     Physical Exam  Gen:- Awake Alert, no conversational dyspnea  HEENT:- [place].AT, No sclera icterus Neck-Supple Neck,No JVD,.  Lungs-diminished in bases, few back basilar  CV- S1, S2 normal, irregular Abd-  +ve B.Sounds, Abd Soft, No tenderness,    Extremity/Skin:- 1+  edema, pedal pulses present  Psych-affect is appropriate, oriented x3 Neuro-generalized weakness, no new focal deficits, no tremors   Data Review:   Micro Results Recent Results (from the past 240 hour(s))  SARS CORONAVIRUS 2 (TAT 6-24 HRS) Nasopharyngeal Nasopharyngeal Swab     Status: None   Collection Time: 09/14/20  2:14 PM   Specimen: Nasopharyngeal Swab  Result Value Ref Range Status   SARS Coronavirus 2 NEGATIVE NEGATIVE Final    Comment: (NOTE) SARS-CoV-2 target nucleic acids are NOT DETECTED.  The SARS-CoV-2 RNA is generally detectable in upper and lower respiratory specimens during the acute phase of infection. Negative results do not preclude SARS-CoV-2 infection, do not rule out co-infections with other pathogens, and should not be used as the sole basis for treatment or other patient management decisions. Negative results must be combined with clinical observations, patient history, and epidemiological information. The expected result is Negative.  Fact Sheet for Patients: SugarRoll.be  Fact Sheet for Healthcare Providers: https://www.woods-mathews.com/  This test is not yet approved or cleared by the [place] FDA and  has been authorized for  detection and/or diagnosis of SARS-CoV-2 by FDA under an Emergency Use Authorization (EUA). This EUA will remain  in effect (meaning this test can be used) for the duration of the COVID-19 declaration under Se ction 564(b)(1) of the Act, 21 U.S.C. section 360bbb-3(b)(1), unless the authorization is terminated or revoked sooner.  Performed at [facility], [place] [address], [place], [place] [address]     Radiology Reports DG Chest [place] 1 View  Result Date: 09/14/2020 CLINICAL DATA:  Leg swelling EXAM: PORTABLE CHEST 1 VIEW COMPARISON:  Portable exam 1322 hours compared to 08/03/2020 FINDINGS: And mild enlargement of cardiac silhouette post CABG. Mediastinal contours and pulmonary vascularity normal. Atherosclerotic calcification aorta. Lungs clear. No infiltrate, pleural effusion, or pneumothorax. Bones demineralized. Probable  RIGHT nipple shadow. Skin folds project over RIGHT lung. IMPRESSION: Enlargement of cardiac silhouette post CABG. No acute abnormalities. Aortic Atherosclerosis (ICD10-I70.0). Electronically Signed   By: Lavonia Dana M.D.   On: 09/14/2020 14:23     CBC Recent Labs  Lab 09/14/20 1307  WBC 6.5  HGB 9.8*  HCT 29.9*  PLT 191  MCV 87.9  MCH 28.8  MCHC 32.8  RDW 14.7  LYMPHSABS 0.9  MONOABS 1.0  EOSABS 0.0  BASOSABS 0.0    Chemistries  Recent Labs  Lab 09/14/20 1307 09/15/20 0633  NA 133* 137  K 3.5 3.1*  CL 100 103  CO2 22 23  GLUCOSE 125* 103*  BUN 49* 49*  CREATININE 1.99* 1.83*  CALCIUM 8.1* 8.1*  AST 58*  --   ALT 23  --   ALKPHOS 132*  --   BILITOT 1.1  --    ------------------------------------------------------------------------------------------------------------------ No results for input(s): CHOL, HDL, LDLCALC, TRIG, CHOLHDL, LDLDIRECT in the last 72 hours.  No results found for: HGBA1C ------------------------------------------------------------------------------------------------------------------ No results for input(s): TSH,  T4TOTAL, T3FREE, THYROIDAB in the last 72 hours.  Invalid input(s): FREET3 ------------------------------------------------------------------------------------------------------------------ No results for input(s): VITAMINB12, FOLATE, FERRITIN, TIBC, IRON, RETICCTPCT in the last 72 hours.  Coagulation profile No results for input(s): INR, PROTIME in the last 168 hours.  No results for input(s): DDIMER in the last 72 hours.  Cardiac Enzymes No results for input(s): CKMB, TROPONINI, MYOGLOBIN in the last 168 hours.  Invalid input(s): CK ------------------------------------------------------------------------------------------------------------------    Component Value Date/Time   BNP 4,227.0 (H) 09/14/2020 1307    Roxan Hockey M.D on 09/15/2020 at 6:25 PM  Go to [email or web address] - for contact info  [facility] - Office  [phone]

## 2020-09-15 NOTE — Care Management Obs Status (Signed)
[place] NOTIFICATION   Patient Details  Name: Julia Hart MRN: [id number] Date of Birth: [birth year]   Medicare Observation Status Notification Given:  Yes    Tommy Medal 09/15/2020, 3:05 PM

## 2020-09-15 NOTE — Progress Notes (Signed)
Pt requested bedpan, stated she had loose stool earlier in the day. Will continue to assess. Pt also stated no pain in her legs until she moves, however, pt reports pain to sacrum and tail bone. Pt stated she has been holding her hand on her bottom to relieve some of the pressure. Advised pt I will turn Q2 during the night.

## 2020-09-16 ENCOUNTER — Inpatient Hospital Stay (HOSPITAL_COMMUNITY): Payer: Medicare Other

## 2020-09-16 DIAGNOSIS — I509 Heart failure, unspecified: Secondary | ICD-10-CM | POA: Diagnosis not present

## 2020-09-16 LAB — BASIC METABOLIC PANEL
Anion gap: 6 (ref 5–15)
BUN: 45 mg/dL — ABNORMAL HIGH (ref 8–23)
CO2: 25 mmol/L (ref 22–32)
Calcium: 7.9 mg/dL — ABNORMAL LOW (ref 8.9–10.3)
Chloride: 106 mmol/L (ref 98–111)
Creatinine, Ser: 1.51 mg/dL — ABNORMAL HIGH (ref 0.44–1.00)
GFR, Estimated: 32 mL/min — ABNORMAL LOW (ref >60)
Glucose, Bld: 89 mg/dL (ref 70–99)
Potassium: 4.5 mmol/L (ref 3.5–5.1)
Sodium: 137 mmol/L (ref 135–145)

## 2020-09-16 MED ORDER — ISOSORBIDE DINITRATE 10 MG PO TABS
ORAL_TABLET | ORAL | Status: AC
  Filled 2020-09-16: qty 1

## 2020-09-16 MED ORDER — ENSURE ENLIVE PO LIQD
237.0000 mL | Freq: Two times a day (BID) | ORAL | Status: DC
  Administered 2020-09-16 – 2020-09-18 (×5): 237 mL via ORAL

## 2020-09-16 MED ORDER — PREDNISOLONE ACETATE 1 % OP SUSP
1.0000 [drp] | Freq: Every day | OPHTHALMIC | Status: DC
  Administered 2020-09-16 – 2020-09-19 (×4): 1 [drp] via OPHTHALMIC
  Filled 2020-09-16: qty 1
  Filled 2020-09-16: qty 5

## 2020-09-16 NOTE — Evaluation (Signed)
Physical Therapy Evaluation Patient Details Name: Julia Hart MRN: [id number] DOB: [birth year] Today's Date: 09/16/2020   History of Present Illness  Julia Hart is a 87 y.o. female with PMHx HTN, HLD, CAD s/p stenting, A fib on Eliquis, CHF with EF 65-70%, and recent COVID infection last month who presents to the ED today with complaint of bilateral lower extremity swelling x 1 week. Pt denies missing any doses of any of her medications. She does not feel like she is more short of breath however appears tachypneic today. She denies chest pain. Pt unable to provide an additional history at this time.   Clinical Impression    Patient agreeable to therapy but unsure secondary to leg pain noted with movement. Able to perform all bed mobilities with min assist with moving legs. Able to stand for 3 minutes while gown and bed linens changed. Able to ambulate with min assist with RW but noted pain and cramping in legs limited length and ambulation. Cues needed to stand up with transfers as she had a tendency to lift her bottom off bed but not extend upright without cues. Patient lives with nephew who can assist as needed but unable to assist 24/7 and provide physical assist needed per patient.  Patient will continue to benefit from skilled physical therapy in hospital and recommended venue below to increase strength, balance, endurance for safe ADLs and gait.    Follow Up Recommendations Supervision for mobility/OOB;SNF    Equipment Recommendations       Recommendations for Other Services       Precautions / Restrictions Precautions Precautions: Fall      Mobility  Bed Mobility Overal bed mobility: Needs Assistance Bed Mobility: Supine to Sit;Sit to Supine     Supine to sit: Min assist Sit to supine: Min assist   General bed mobility comments: min assist with legs and verbal cues    Transfers Overall transfer level: Needs assistance Equipment used: Rolling walker (2  wheeled) Transfers: Sit to/from Stand Sit to Stand: Min assist         General transfer comment: verbal cues to standup tall and push off from bed  Ambulation/Gait Ambulation/Gait assistance: Min guard Gait Distance (Feet): 10 Feet Assistive device: Rolling walker (2 wheeled) Gait Pattern/deviations: Trunk flexed;Decreased step length - right;Decreased step length - left;Step-through pattern;Decreased stride length Gait velocity: decreased   General Gait Details: slow labored movements  Stairs            Wheelchair Mobility    Modified Rankin (Stroke Patients Only)       Balance Overall balance assessment: Needs assistance Sitting-balance support: Single extremity supported Sitting balance-Leahy Scale: Poor     Standing balance support: Bilateral upper extremity supported Standing balance-Leahy Scale: Poor                               Pertinent Vitals/Pain Pain Assessment: 0-10 Pain Score: 10-Worst pain ever Pain Location: in both legs L>R Pain Intervention(s): Monitored during session;Limited activity within patient's tolerance    Home Living Family/patient expects to be discharged to:: Private residence Living Arrangements: Other relatives (nephew) Available Help at Discharge: Family Type of Home: House Home Access: Stairs to enter   [employer] of Steps: 2 steps with posts to hold on to Home Layout: One level Home Equipment: Environmental consultant - 2 wheels;Cane - single point;Grab bars - tub/shower;Bedside commode      Prior Function Level of Independence: Independent  Comments: reports she does not use an assistive device at baseline and is able to care for herself     Hand Dominance        Extremity/Trunk Assessment        Lower Extremity Assessment Lower Extremity Assessment: Generalized weakness       Communication   Communication: No difficulties  Cognition Arousal/Alertness: Awake/alert   Overall  Cognitive Status: Within Functional Limits for tasks assessed                                        General Comments      Exercises General Exercises - Lower Extremity Ankle Circles/Pumps: AROM;Both;15 reps Long Arc Quad: AROM;Both;15 reps Hip Flexion/Marching: AROM;Both;15 reps   Assessment/Plan    PT Assessment Patient needs continued PT services  PT Problem List Decreased strength;Decreased range of motion;Decreased activity tolerance;Decreased balance;Decreased mobility       PT Treatment Interventions DME instruction;Gait training;Stair training;Therapeutic activities;Therapeutic exercise;Patient/family education;Functional mobility training    PT Goals (Current goals can be found in the Care Plan section)  Acute Rehab PT Goals Patient Stated Goal: to get stronger PT Goal Formulation: With patient/family Time For Goal Achievement: 09/30/20 Potential to Achieve Goals: Good    Frequency Min 3X/week   Barriers to discharge Decreased caregiver support no one there 24/7 to assist with mobility needs at this time    Co-evaluation               AM-PAC PT "6 Clicks" Mobility  Outcome Measure Help needed turning from your back to your side while in a flat bed without using bedrails?: A Little Help needed moving from lying on your back to sitting on the side of a flat bed without using bedrails?: A Little Help needed moving to and from a bed to a chair (including a wheelchair)?: A Little Help needed standing up from a chair using your arms (e.g., wheelchair or bedside chair)?: A Little Help needed to walk in hospital room?: A Little Help needed climbing 3-5 steps with a railing? : A Lot 6 Click Score: 17    End of Session   Activity Tolerance: Patient limited by pain;Patient limited by lethargy Patient left: in bed;with bed alarm set;with call bell/phone within reach;with family/visitor present Nurse Communication: Mobility status PT Visit  Diagnosis: Unsteadiness on feet (R26.81);Other abnormalities of gait and mobility (R26.89);Muscle weakness (generalized) (M62.81);Difficulty in walking, not elsewhere classified (R26.2)    Time: 1308-6578 PT Time Calculation (min) (ACUTE ONLY): 28 min   Charges:   PT Evaluation $PT Eval Low Complexity: 1 Low PT Treatments $Therapeutic Exercise: 8-22 mins      1:33 PM, 09/16/20 Jerene Pitch, DPT Physical Therapy with [facility]  [phone] office

## 2020-09-16 NOTE — Plan of Care (Signed)
°  Problem: Education: Goal: Knowledge of General Education information will improve Description: Including pain rating scale, medication(s)/side effects and non-pharmacologic comfort measures Outcome: Progressing   Problem: Health Behavior/Discharge Planning: Goal: Ability to manage health-related needs will improve Outcome: Progressing   Problem: Education: Goal: Ability to demonstrate management of disease process will improve Outcome: Progressing   

## 2020-09-16 NOTE — Progress Notes (Signed)
Initial Nutrition Assessment  DOCUMENTATION CODES:   Not applicable  INTERVENTION:  Ensure Enlive po BID, each supplement provides 350 kcal and 20 grams of protein  Magic cup TID with meals, each supplement provides 290 kcal and 9 grams of protein  Downgrade diet to DYS 3 (chopped meats) for ease of intake   NUTRITION DIAGNOSIS:   Increased nutrient needs related to acute illness,chronic illness (acute on chronic CHF) as evidenced by estimated needs.    GOAL:   Patient will meet greater than or equal to 90% of their needs    MONITOR:   Labs,I & O's,Supplement acceptance,PO intake,Weight trends,Skin  REASON FOR ASSESSMENT:   Malnutrition Screening Tool    ASSESSMENT:  87 year old female admitted with decompensated heart failure with recent hospitalization (1/13-1/17) for CHF and COVID-19 infection and then 2/20-2/21 for same. Past medical history of atrial fibrillation, HTN, CAD, HLD, and CHF.  RD working remotely.  Attempted to contact pt via phone this afternoon, however no answer and unable to obtain nutrition history at this time. She is receiving a heart healthy diet, no documented meals for review. Suspect decreased oral intake given noted persistent shortness of breath, will order Ensure and Magic Cup supplements to help her meet her needs as well as downgrade diet to DYS 3 (chopped meats) for ease of intake.  Current wt 61.2 kg  Actual wt masked by fluid, noted BLE moderate pitting edema. Per chart, weights have increased ~13 lbs (10.5%) in the last 6 weeks; significant.  Weight trend likely secondary to fluid status.  I/Os: -1110 ml since admit UOP: 1050 ml x 24 hrs  Medications reviewed and include: IV Lasix 40 mg daily, Protonix  Labs: BUN 45 (H), Cr 1.51 (H) 2/24 BNP 4227 (H)  NUTRITION - FOCUSED PHYSICAL EXAM:  Unable to complete at this time  Diet Order:   Diet Order            Diet Heart Room service appropriate? Yes; Fluid consistency: Thin   Diet effective now                 EDUCATION NEEDS:   Not appropriate for education at this time  Skin:  Skin Assessment: Reviewed RN Assessment  Last BM:  2/24  Height:   Ht Readings from Last 1 Encounters:  09/14/20 5\' 6"  (1.676 m)    Weight:   Wt Readings from Last 1 Encounters:  09/16/20 61.2 kg    BMI:  Body mass index is 21.78 kg/m.  Estimated Nutritional Needs:   Kcal:  1700-1900  Protein:  73-85  Fluid:  > 1.4 L/day    Lajuan Lines, RD, LDN Clinical Nutrition After Hours/Weekend Pager # in [place]

## 2020-09-16 NOTE — Progress Notes (Signed)
Patient Demographics:    Julia Hart, is a 87 y.o. female, DOB - 10-26-1926, UKG:[id number]  Admit date - 09/14/2020   Admitting Physician Purcell Jungbluth Denton Brick, MD  Outpatient Primary MD for the patient is [name], MD  LOS - 1   Chief Complaint  Patient presents with  . Leg Swelling        Subjective:    [name] today has no fevers, no emesis,  No chest pain,   -- Left leg discomfort and swelling persists--no pleuritic symptoms  Assessment  & Plan :    Principal Problem:   Decompensated heart failure (HCC) Active Problems:   Anemia   Coronary artery disease   COVID-19 virus infection   Atrial fibrillation, chronic (HCC)   Acute exacerbation of CHF (congestive heart failure) (HCC)  Brief Summary:- 87 y.o. female with medical history significant for congestive heart failure, atrial fibrillation, hypertension, recent Covid infection admitted on 09/14/2020 with acute on chronic CHF exacerbation.  A/p 1)HFpEF--acute on chronic combined systolic and diastolic dysfunction CHF exacerbation -Overall improving -Echo from 08/04/2020 with EF of 65 to 70% with grade 2 diastolic dysfunction -Continue IV Lasix monitor fluid input and output closely -Hold Entresto on hold Aldactone due to worsening renal failure -  2) chronic atrial fibrillation--- continue Eliquis for stroke prophylaxis and metoprolol for rate control  3)AKI----acute kidney injury on CKD stage -3A -continue to monitor renal function closely with IV diuresis -Creatinine was 1.9 on admission, baseline usually around 1.1 - renally adjust medications, avoid nephrotoxic agents / dehydration  / hypotension -Creatinine improving, continue to hold Entresto and Aldactone  4)HTN--- watch BP closely with medication adjustments, okay to continue amlodipine, metoprolol, and isosorbide, patient is on IV Lasix for now Entresto and  Aldactone are on hold  5)CAD-stable, chest pain-free, continue isosorbide, metoprolol Plavix and Lipitor  6)Recent COVID-19 infection--- diagnosed August 03, 2020 -Previously treated and appears to be largely asymptomatic from a COVID-19 infection standpoint at this time  7) generalized weakness and deconditioning---  PT eval appreciated, recommends SNF rehab -Patient agreeable  8) left leg discomfort--- lower extremity venous Dopplers with 6 cm Baker's cyst on the left  Disposition/Need for in-Hospital Stay- patient unable to be discharged at this time due to -acute CHF exacerbation requiring IV diuresis and close monitoring of renal function in the setting of AKI on CKD* -Patient needs SNF rehab  Status is: Inpatient  Remains inpatient appropriate because:Please see above   Disposition: The patient is from: Home              Anticipated d/c is to: Home              Anticipated d/c date is: 2 days              Patient currently is not medically stable to d/c. Barriers: Not Clinically Stable-   Code Status :  -  Code Status: DNR   Family Communication:    NA (patient is alert, awake and coherent)   Consults  :    DVT Prophylaxis  :   - SCDs   apixaban (ELIQUIS) tablet 2.5 mg Start: 09/14/20 2200 apixaban (ELIQUIS) tablet 2.5 mg    Lab Results  Component Value Date   PLT  191 09/14/2020    Inpatient Medications  Scheduled Meds: . amLODipine  5 mg Oral Daily  . apixaban  2.5 mg Oral BID  . atorvastatin  20 mg Oral QHS  . clopidogrel  75 mg Oral Daily  . feeding supplement  237 mL Oral BID BM  . furosemide  40 mg Intravenous Daily  . hydrALAZINE  50 mg Oral TID  . isosorbide dinitrate  5 mg Oral BID  . metoprolol succinate  25 mg Oral Daily  . pantoprazole  40 mg Oral Daily  . prednisoLONE acetate  1 drop Both Eyes Daily   Continuous Infusions: PRN Meds:.acetaminophen **OR** acetaminophen, ondansetron **OR** ondansetron (ZOFRAN) IV, polyethylene  glycol    Anti-infectives (From admission, onward)   None        Objective:   Vitals:   09/16/20 0500 09/16/20 0523 09/16/20 0935 09/16/20 1330  BP:  (!) 135/50 (!) 131/55 130/69  Pulse:  69 70 72  Resp:  18 18 18   Temp:  97.6 F (36.4 C) 98 F (36.7 C) 98.3 F (36.8 C)  TempSrc:  Oral Oral Oral  SpO2:  97% 100% 100%  Weight: 61.2 kg     Height:        Wt Readings from Last 3 Encounters:  09/16/20 61.2 kg  08/07/20 55.4 kg  07/20/20 58.2 kg     Intake/Output Summary (Last 24 hours) at 09/16/2020 1841 Last data filed at 09/16/2020 1839 Gross per 24 hour  Intake 440 ml  Output 1450 ml  Net -1010 ml    Physical Exam  Gen:- Awake Alert, no conversational dyspnea  HEENT:- [place].AT, No sclera icterus Neck-Supple Neck,No JVD,.  Lungs-diminished in bases, few back basilar  CV- S1, S2 normal, irregular Abd-  +ve B.Sounds, Abd Soft, No tenderness,    Extremity/Skin:- 1+  edema, pedal pulses present  Psych-affect is appropriate, oriented x3 Neuro-generalized weakness, no new focal deficits, no tremors   Data Review:   Micro Results Recent Results (from the past 240 hour(s))  SARS CORONAVIRUS 2 (TAT 6-24 HRS) Nasopharyngeal Nasopharyngeal Swab     Status: None   Collection Time: 09/14/20  2:14 PM   Specimen: Nasopharyngeal Swab  Result Value Ref Range Status   SARS Coronavirus 2 NEGATIVE NEGATIVE Final    Comment: (NOTE) SARS-CoV-2 target nucleic acids are NOT DETECTED.  The SARS-CoV-2 RNA is generally detectable in upper and lower respiratory specimens during the acute phase of infection. Negative results do not preclude SARS-CoV-2 infection, do not rule out co-infections with other pathogens, and should not be used as the sole basis for treatment or other patient management decisions. Negative results must be combined with clinical observations, patient history, and epidemiological information. The expected result is Negative.  Fact Sheet for  Patients: SugarRoll.be  Fact Sheet for Healthcare Providers: https://www.woods-mathews.com/  This test is not yet approved or cleared by the [place] FDA and  has been authorized for detection and/or diagnosis of SARS-CoV-2 by FDA under an Emergency Use Authorization (EUA). This EUA will remain  in effect (meaning this test can be used) for the duration of the COVID-19 declaration under Se ction 564(b)(1) of the Act, 21 U.S.C. section 360bbb-3(b)(1), unless the authorization is terminated or revoked sooner.  Performed at [facility], [place] [address], [place], [place] [address]     Radiology Reports US Venous Img Lower Bilateral (DVT)  Result Date: 09/16/2020 CLINICAL DATA:  Lower extremity pain and edema.  Evaluate for DVT. EXAM: BILATERAL LOWER  EXTREMITY VENOUS DOPPLER ULTRASOUND TECHNIQUE: Gray-scale sonography with graded compression, as well as color Doppler and duplex ultrasound were performed to evaluate the lower extremity deep venous systems from the level of the common femoral vein and including the common femoral, femoral, profunda femoral, popliteal and calf veins including the posterior tibial, peroneal and gastrocnemius veins when visible. The superficial great saphenous vein was also interrogated. Spectral Doppler was utilized to evaluate flow at rest and with distal augmentation maneuvers in the common femoral, femoral and popliteal veins. COMPARISON:  None. FINDINGS: RIGHT LOWER EXTREMITY Common Femoral Vein: No evidence of thrombus. Normal compressibility, respiratory phasicity and response to augmentation. Saphenofemoral Junction: No evidence of thrombus. Normal compressibility and flow on color Doppler imaging. Profunda Femoral Vein: No evidence of thrombus. Normal compressibility and flow on color Doppler imaging. Femoral Vein: No evidence of thrombus. Normal compressibility, respiratory phasicity and response to  augmentation. Popliteal Vein: No evidence of thrombus. Normal compressibility, respiratory phasicity and response to augmentation. Calf Veins: No evidence of thrombus. Normal compressibility and flow on color Doppler imaging. Superficial Great Saphenous Vein: No evidence of thrombus. Normal compressibility. Venous Reflux:  None. Other Findings:  None. LEFT LOWER EXTREMITY Common Femoral Vein: No evidence of thrombus. Normal compressibility, respiratory phasicity and response to augmentation. Saphenofemoral Junction: No evidence of thrombus. Normal compressibility and flow on color Doppler imaging. Profunda Femoral Vein: No evidence of thrombus. Normal compressibility and flow on color Doppler imaging. Femoral Vein: No evidence of thrombus. Normal compressibility, respiratory phasicity and response to augmentation. Popliteal Vein: No evidence of thrombus. Normal compressibility, respiratory phasicity and response to augmentation. Calf Veins: No evidence of thrombus. Normal compressibility and flow on color Doppler imaging. Superficial Great Saphenous Vein: No evidence of thrombus. Normal compressibility. Venous Reflux:  None. Other Findings: Note is made of an approximately 6.1 x 3.0 x 1.1 cm minimally complex fluid collection within the left popliteal fossa compatible with a Baker's cyst. IMPRESSION: 1. No evidence of DVT within either lower extremity. 2. Incidental note made of approximately 6.1 cm minimally complex left-sided Baker's cyst. Electronically Signed   By: Sandi Mariscal M.D.   On: 09/16/2020 14:22   DG Chest Port 1 View  Result Date: 09/14/2020 CLINICAL DATA:  Leg swelling EXAM: PORTABLE CHEST 1 VIEW COMPARISON:  Portable exam 1322 hours compared to 08/03/2020 FINDINGS: And mild enlargement of cardiac silhouette post CABG. Mediastinal contours and pulmonary vascularity normal. Atherosclerotic calcification aorta. Lungs clear. No infiltrate, pleural effusion, or pneumothorax. Bones demineralized.  Probable RIGHT nipple shadow. Skin folds project over RIGHT lung. IMPRESSION: Enlargement of cardiac silhouette post CABG. No acute abnormalities. Aortic Atherosclerosis (ICD10-I70.0). Electronically Signed   By: Lavonia Dana M.D.   On: 09/14/2020 14:23     CBC Recent Labs  Lab 09/14/20 1307  WBC 6.5  HGB 9.8*  HCT 29.9*  PLT 191  MCV 87.9  MCH 28.8  MCHC 32.8  RDW 14.7  LYMPHSABS 0.9  MONOABS 1.0  EOSABS 0.0  BASOSABS 0.0    Chemistries  Recent Labs  Lab 09/14/20 1307 09/15/20 0633 09/16/20 0634  NA 133* 137 137  K 3.5 3.1* 4.5  CL 100 103 106  CO2 22 23 25   GLUCOSE 125* 103* 89  BUN 49* 49* 45*  CREATININE 1.99* 1.83* 1.51*  CALCIUM 8.1* 8.1* 7.9*  AST 58*  --   --   ALT 23  --   --   ALKPHOS 132*  --   --   BILITOT 1.1  --   --    ------------------------------------------------------------------------------------------------------------------  No results for input(s): CHOL, HDL, LDLCALC, TRIG, CHOLHDL, LDLDIRECT in the last 72 hours.  No results found for: HGBA1C ------------------------------------------------------------------------------------------------------------------ No results for input(s): TSH, T4TOTAL, T3FREE, THYROIDAB in the last 72 hours.  Invalid input(s): FREET3 ------------------------------------------------------------------------------------------------------------------ No results for input(s): VITAMINB12, FOLATE, FERRITIN, TIBC, IRON, RETICCTPCT in the last 72 hours.  Coagulation profile No results for input(s): INR, PROTIME in the last 168 hours.  No results for input(s): DDIMER in the last 72 hours.  Cardiac Enzymes No results for input(s): CKMB, TROPONINI, MYOGLOBIN in the last 168 hours.  Invalid input(s): CK ------------------------------------------------------------------------------------------------------------------    Component Value Date/Time   BNP 4,227.0 (H) 09/14/2020 1307    Roxan Hockey M.D on 09/16/2020 at  6:41 PM  Go to [email or web address] - for contact info  [facility] - Office  [phone]

## 2020-09-16 NOTE — Plan of Care (Signed)
  Problem: Acute Rehab PT Goals(only PT should resolve) Goal: Patient Will Transfer Sit To/From Stand Flowsheets (Taken 09/16/2020 1333) Patient will transfer sit to/from stand: with supervision Goal: Pt Will Transfer Bed To Chair/Chair To Bed Flowsheets (Taken 09/16/2020 1333) Pt will Transfer Bed to Chair/Chair to Bed: min guard assist Goal: Pt Will Ambulate Flowsheets (Taken 09/16/2020 1333) Pt will Ambulate:  50 feet  with supervision  with least restrictive assistive device    1:34 PM, 09/16/20 Jerene Pitch, DPT Physical Therapy with [facility]  [phone] office

## 2020-09-17 ENCOUNTER — Encounter (HOSPITAL_COMMUNITY): Payer: Self-pay | Admitting: Family Medicine

## 2020-09-17 DIAGNOSIS — I509 Heart failure, unspecified: Secondary | ICD-10-CM | POA: Diagnosis not present

## 2020-09-17 LAB — CBC
HCT: 25.5 % — ABNORMAL LOW (ref 36.0–46.0)
Hemoglobin: 8.3 g/dL — ABNORMAL LOW (ref 12.0–15.0)
MCH: 28.6 pg (ref 26.0–34.0)
MCHC: 32.5 g/dL (ref 30.0–36.0)
MCV: 87.9 fL (ref 80.0–100.0)
Platelets: 263 10*3/uL (ref 150–400)
RBC: 2.9 MIL/uL — ABNORMAL LOW (ref 3.87–5.11)
RDW: 14.8 % (ref 11.5–15.5)
WBC: 4.2 10*3/uL (ref 4.0–10.5)
nRBC: 0 % (ref 0.0–0.2)

## 2020-09-17 LAB — RENAL FUNCTION PANEL
Albumin: 2.2 g/dL — ABNORMAL LOW (ref 3.5–5.0)
Anion gap: 6 (ref 5–15)
BUN: 41 mg/dL — ABNORMAL HIGH (ref 8–23)
CO2: 25 mmol/L (ref 22–32)
Calcium: 8 mg/dL — ABNORMAL LOW (ref 8.9–10.3)
Chloride: 104 mmol/L (ref 98–111)
Creatinine, Ser: 1.38 mg/dL — ABNORMAL HIGH (ref 0.44–1.00)
GFR, Estimated: 36 mL/min — ABNORMAL LOW (ref >60)
Glucose, Bld: 86 mg/dL (ref 70–99)
Phosphorus: 2.9 mg/dL (ref 2.5–4.6)
Potassium: 4.1 mmol/L (ref 3.5–5.1)
Sodium: 135 mmol/L (ref 135–145)

## 2020-09-17 LAB — OCCULT BLOOD X 1 CARD TO LAB, STOOL: Fecal Occult Bld: NEGATIVE

## 2020-09-17 NOTE — Progress Notes (Signed)
Patient Demographics:    Julia Hart, is a 87 y.o. female, DOB - 1926-10-26, XBD:[id number]  Admit date - 09/14/2020   Admitting Physician Courage Denton Brick, MD  Outpatient Primary MD for the patient is [name], MD  LOS - 2   Chief Complaint  Patient presents with  . Leg Swelling        Subjective:    [name] today has no fevers, no emesis,  No chest pain,   -- Fatigue persist -shob is better  Assessment  & Plan :    Principal Problem:   Decompensated heart failure (HCC) Active Problems:   Anemia   Coronary artery disease   COVID-19 virus infection   Atrial fibrillation, chronic (HCC)   Acute exacerbation of CHF (congestive heart failure) (HCC)  Brief Summary:- 87 y.o. female with medical history significant for congestive heart failure, atrial fibrillation, hypertension, recent Covid infection admitted on 09/14/2020 with acute on chronic CHF exacerbation.  A/p 1)HFpEF--acute on chronic combined systolic and diastolic dysfunction CHF exacerbation -Shortness of breath overall continues to improve -Echo from 08/04/2020 with EF of 65 to 70% with grade 2 diastolic dysfunction -Continue IV Lasix monitor fluid input and output closely--creatinine down to 1.38 -Hold Entresto on hold Aldactone due to worsening renal failure -  2) chronic atrial fibrillation--- continue Eliquis for stroke prophylaxis and metoprolol for rate control  3)AKI----acute kidney injury on CKD stage -3A -continue to monitor renal function closely with IV diuresis -Creatinine was 1.9 on admission, creatinine is down to 1.38  baseline usually around 1.1 - renally adjust medications, avoid nephrotoxic agents / dehydration  / hypotension -Creatinine improving, continue to hold Entresto and Aldactone  4)HTN--- watch BP closely with medication adjustments, okay to continue amlodipine, metoprolol, and isosorbide,  patient is on IV Lasix --- for now Entresto and Aldactone are on hold  5)CAD-stable, chest pain-free, continue isosorbide, metoprolol Plavix and Lipitor  6)Recent COVID-19 infection--- diagnosed August 03, 2020 -Previously treated and appears to be largely asymptomatic from a COVID-19 infection standpoint at this time  7) generalized weakness and deconditioning---  PT eval appreciated, recommends SNF rehab -Patient agreeable  8) left leg discomfort--- lower extremity venous Dopplers with 6 cm Baker's cyst on the left  9) acute on chronic anemia--- ??  CKD related -Stool occult blood negative -B12, folate, ferritin and iron and TIBC pending  Disposition/Need for in-Hospital Stay- patient unable to be discharged at this time due to -acute CHF exacerbation requiring IV diuresis and close monitoring of renal function in the setting of AKI on CKD* -Patient needs SNF rehab  Status is: Inpatient  Remains inpatient appropriate because:Please see above   Disposition: The patient is from: Home              Anticipated d/c is to: SNF              Anticipated d/c date is: 1 day              Patient currently is not medically stable to d/c. Barriers: Not Clinically Stable-   Code Status :  -  Code Status: DNR   Family Communication:    NA (patient is alert, awake and coherent)   Consults  :    DVT  Prophylaxis  :   - SCDs   apixaban (ELIQUIS) tablet 2.5 mg Start: 09/14/20 2200 apixaban (ELIQUIS) tablet 2.5 mg    Lab Results  Component Value Date   PLT 263 09/17/2020    Inpatient Medications  Scheduled Meds: . amLODipine  5 mg Oral Daily  . apixaban  2.5 mg Oral BID  . atorvastatin  20 mg Oral QHS  . clopidogrel  75 mg Oral Daily  . feeding supplement  237 mL Oral BID BM  . furosemide  40 mg Intravenous Daily  . hydrALAZINE  50 mg Oral TID  . isosorbide dinitrate  5 mg Oral BID  . metoprolol succinate  25 mg Oral Daily  . pantoprazole  40 mg Oral Daily  . prednisoLONE  acetate  1 drop Both Eyes Daily   Continuous Infusions: PRN Meds:.acetaminophen **OR** acetaminophen, ondansetron **OR** ondansetron (ZOFRAN) IV, polyethylene glycol    Anti-infectives (From admission, onward)   None        Objective:   Vitals:   09/16/20 2207 09/17/20 0615 09/17/20 1358 09/17/20 1425  BP: (!) 131/51 (!) 132/32 (!) 129/47 (!) 118/57  Pulse: (!) 108 70 (!) 56 61  Resp: 16 13 18 16   Temp: 98.3 F (36.8 C) 98.5 F (36.9 C) (!) 97.3 F (36.3 C) 98.2 F (36.8 C)  TempSrc: Oral  Oral Oral  SpO2: 97% 100% 99% 98%  Weight:  60.4 kg    Height:        Wt Readings from Last 3 Encounters:  09/17/20 60.4 kg  08/07/20 55.4 kg  07/20/20 58.2 kg     Intake/Output Summary (Last 24 hours) at 09/17/2020 1609 Last data filed at 09/17/2020 1500 Gross per 24 hour  Intake 720 ml  Output 200 ml  Net 520 ml    Physical Exam  Gen:- Awake Alert, no conversational dyspnea  HEENT:- [place].AT, No sclera icterus Neck-Supple Neck,No JVD,.  Lungs-diminished in bases, faint rales CV- S1, S2 normal, irregular Abd-  +ve B.Sounds, Abd Soft, No tenderness,    Extremity/Skin:- 1+  edema, pedal pulses present  Psych-affect is appropriate, oriented x3 Neuro-generalized weakness, no new focal deficits, no tremors   Data Review:   Micro Results Recent Results (from the past 240 hour(s))  SARS CORONAVIRUS 2 (TAT 6-24 HRS) Nasopharyngeal Nasopharyngeal Swab     Status: None   Collection Time: 09/14/20  2:14 PM   Specimen: Nasopharyngeal Swab  Result Value Ref Range Status   SARS Coronavirus 2 NEGATIVE NEGATIVE Final    Comment: (NOTE) SARS-CoV-2 target nucleic acids are NOT DETECTED.  The SARS-CoV-2 RNA is generally detectable in upper and lower respiratory specimens during the acute phase of infection. Negative results do not preclude SARS-CoV-2 infection, do not rule out co-infections with other pathogens, and should not be used as the sole basis for treatment or other  patient management decisions. Negative results must be combined with clinical observations, patient history, and epidemiological information. The expected result is Negative.  Fact Sheet for Patients: SugarRoll.be  Fact Sheet for Healthcare Providers: https://www.woods-mathews.com/  This test is not yet approved or cleared by the [place] FDA and  has been authorized for detection and/or diagnosis of SARS-CoV-2 by FDA under an Emergency Use Authorization (EUA). This EUA will remain  in effect (meaning this test can be used) for the duration of the COVID-19 declaration under Se ction 564(b)(1) of the Act, 21 U.S.C. section 360bbb-3(b)(1), unless the authorization is terminated or revoked sooner.  Performed at [facility]  Hospital Lab, [place] [address], [place], [place] [address]     Radiology Reports US Venous Img Lower Bilateral (DVT)  Result Date: 09/16/2020 CLINICAL DATA:  Lower extremity pain and edema.  Evaluate for DVT. EXAM: BILATERAL LOWER EXTREMITY VENOUS DOPPLER ULTRASOUND TECHNIQUE: Gray-scale sonography with graded compression, as well as color Doppler and duplex ultrasound were performed to evaluate the lower extremity deep venous systems from the level of the common femoral vein and including the common femoral, femoral, profunda femoral, popliteal and calf veins including the posterior tibial, peroneal and gastrocnemius veins when visible. The superficial great saphenous vein was also interrogated. Spectral Doppler was utilized to evaluate flow at rest and with distal augmentation maneuvers in the common femoral, femoral and popliteal veins. COMPARISON:  None. FINDINGS: RIGHT LOWER EXTREMITY Common Femoral Vein: No evidence of thrombus. Normal compressibility, respiratory phasicity and response to augmentation. Saphenofemoral Junction: No evidence of thrombus. Normal compressibility and flow on color Doppler imaging. Profunda Femoral Vein:  No evidence of thrombus. Normal compressibility and flow on color Doppler imaging. Femoral Vein: No evidence of thrombus. Normal compressibility, respiratory phasicity and response to augmentation. Popliteal Vein: No evidence of thrombus. Normal compressibility, respiratory phasicity and response to augmentation. Calf Veins: No evidence of thrombus. Normal compressibility and flow on color Doppler imaging. Superficial Great Saphenous Vein: No evidence of thrombus. Normal compressibility. Venous Reflux:  None. Other Findings:  None. LEFT LOWER EXTREMITY Common Femoral Vein: No evidence of thrombus. Normal compressibility, respiratory phasicity and response to augmentation. Saphenofemoral Junction: No evidence of thrombus. Normal compressibility and flow on color Doppler imaging. Profunda Femoral Vein: No evidence of thrombus. Normal compressibility and flow on color Doppler imaging. Femoral Vein: No evidence of thrombus. Normal compressibility, respiratory phasicity and response to augmentation. Popliteal Vein: No evidence of thrombus. Normal compressibility, respiratory phasicity and response to augmentation. Calf Veins: No evidence of thrombus. Normal compressibility and flow on color Doppler imaging. Superficial Great Saphenous Vein: No evidence of thrombus. Normal compressibility. Venous Reflux:  None. Other Findings: Note is made of an approximately 6.1 x 3.0 x 1.1 cm minimally complex fluid collection within the left popliteal fossa compatible with a Baker's cyst. IMPRESSION: 1. No evidence of DVT within either lower extremity. 2. Incidental note made of approximately 6.1 cm minimally complex left-sided Baker's cyst. Electronically Signed   By: Sandi Mariscal M.D.   On: 09/16/2020 14:22   DG Chest Port 1 View  Result Date: 09/14/2020 CLINICAL DATA:  Leg swelling EXAM: PORTABLE CHEST 1 VIEW COMPARISON:  Portable exam 1322 hours compared to 08/03/2020 FINDINGS: And mild enlargement of cardiac silhouette post  CABG. Mediastinal contours and pulmonary vascularity normal. Atherosclerotic calcification aorta. Lungs clear. No infiltrate, pleural effusion, or pneumothorax. Bones demineralized. Probable RIGHT nipple shadow. Skin folds project over RIGHT lung. IMPRESSION: Enlargement of cardiac silhouette post CABG. No acute abnormalities. Aortic Atherosclerosis (ICD10-I70.0). Electronically Signed   By: Lavonia Dana M.D.   On: 09/14/2020 14:23     CBC Recent Labs  Lab 09/14/20 1307 09/17/20 0618  WBC 6.5 4.2  HGB 9.8* 8.3*  HCT 29.9* 25.5*  PLT 191 263  MCV 87.9 87.9  MCH 28.8 28.6  MCHC 32.8 32.5  RDW 14.7 14.8  LYMPHSABS 0.9  --   MONOABS 1.0  --   EOSABS 0.0  --   BASOSABS 0.0  --     Chemistries  Recent Labs  Lab 09/14/20 1307 09/15/20 0633 09/16/20 0634 09/17/20 0618  NA 133* 137 137 135  K 3.5 3.1*  4.5 4.1  CL 100 103 106 104  CO2 22 23 25 25   GLUCOSE 125* 103* 89 86  BUN 49* 49* 45* 41*  CREATININE 1.99* 1.83* 1.51* 1.38*  CALCIUM 8.1* 8.1* 7.9* 8.0*  AST 58*  --   --   --   ALT 23  --   --   --   ALKPHOS 132*  --   --   --   BILITOT 1.1  --   --   --    ------------------------------------------------------------------------------------------------------------------ No results for input(s): CHOL, HDL, LDLCALC, TRIG, CHOLHDL, LDLDIRECT in the last 72 hours.  No results found for: HGBA1C ------------------------------------------------------------------------------------------------------------------ No results for input(s): TSH, T4TOTAL, T3FREE, THYROIDAB in the last 72 hours.  Invalid input(s): FREET3 ------------------------------------------------------------------------------------------------------------------ No results for input(s): VITAMINB12, FOLATE, FERRITIN, TIBC, IRON, RETICCTPCT in the last 72 hours.  Coagulation profile No results for input(s): INR, PROTIME in the last 168 hours.  No results for input(s): DDIMER in the last 72 hours.  Cardiac  Enzymes No results for input(s): CKMB, TROPONINI, MYOGLOBIN in the last 168 hours.  Invalid input(s): CK ------------------------------------------------------------------------------------------------------------------    Component Value Date/Time   BNP 4,227.0 (H) 09/14/2020 1307    Roxan Hockey M.D on 09/17/2020 at 4:09 PM  Go to [email or web address] - for contact info  [facility] - Office  [phone]

## 2020-09-17 NOTE — TOC Progression Note (Signed)
Transition of Care [facility] - Progression Note    Patient Details  Name: Julia Hart MRN: [id number] Date of Birth: 1927-06-07  [facility]) CM/SW Contact  Natasha Bence, LCSW Phone Number: 09/17/2020, 4:27 PM  Clinical Narrative:    Patient and family agreeable to SNF per MD. CSW inquired to family about preferred facilites. Patient's family requested SNF referral for [facility] as their first choice and [facility] as their second choice. CSW faxed referral to both facilities. TOC to follow.   Expected Discharge Plan: [place] Barriers to Discharge: Continued Medical Work up  Expected Discharge Plan and Services Expected Discharge Plan: [place] In-house Referral: Clinical Social Work Discharge Planning Services: CM Consult Post Acute Care Choice: [place] arrangements for the past 2 months: Single Family Home                 DME Arranged: N/A DME Agency: NA                   Social Determinants of Health (SDOH) Interventions    Readmission Risk Interventions No flowsheet data found.

## 2020-09-17 NOTE — NC FL2 (Signed)
[place] LEVEL OF CARE SCREENING TOOL     IDENTIFICATION  Patient Name: Julia Hart Birthdate: Oct 16, 1926 Sex: female Admission Date (Current Location): 09/14/2020  [facility] and [place] Number:  [employer] and Address:  [place] [address], [place]      Provider Number: [id number]  Attending Physician Name and Address:  Roxan Hockey, MD  Relative Name and Phone Number:  Santo Held Niece [phone]    Current Level of Care: SNF Recommended Level of Care: [place] Prior Approval Number:    Date Approved/Denied:   PASRR Number:    Discharge Plan: SNF    Current Diagnoses: Patient Active Problem List   Diagnosis Date Noted  . Acute exacerbation of CHF (congestive heart failure) ([place]) 09/15/2020  . Acute on chronic systolic CHF (congestive heart failure) ([place]) 08/04/2020  . Decompensated heart failure ([place]) 08/03/2020  . Coronary artery disease 08/03/2020  . HTN (hypertension) 08/03/2020  . COVID-19 virus infection 08/03/2020  . Atrial fibrillation, chronic ([place]) 08/03/2020  . GERD (gastroesophageal reflux disease) 07/20/2020  . Melena 06/29/2020  . Abdominal pain 06/29/2020  . Loss of weight   . Anemia 12/19/2016  . Change in bowel habits 12/19/2016    Orientation RESPIRATION BLADDER Height & Weight     Self,Time,Place  Normal Continent,External catheter Weight: 133 lb 2.5 oz (60.4 kg) Height:  5\' 6"  (167.6 cm)  BEHAVIORAL SYMPTOMS/MOOD NEUROLOGICAL BOWEL NUTRITION STATUS      Continent Diet (Diet Heart Room service appropriate? Yes; Fluid consistency: Thin)  AMBULATORY STATUS COMMUNICATION OF NEEDS Skin   Extensive Assist Verbally Normal                       Personal Care Assistance Level of Assistance  Bathing,Feeding,Dressing Bathing Assistance: Limited assistance Feeding assistance: Limited assistance Dressing Assistance: Limited assistance     Functional  Limitations Info  Sight,Hearing,Speech Sight Info: Adequate Hearing Info: Adequate Speech Info: Adequate    SPECIAL CARE FACTORS FREQUENCY  PT (By licensed PT)     PT Frequency: 5x              Contractures Contractures Info: Not present    Additional Factors Info  Code Status Code Status Info: Full             Current Medications (09/17/2020):  This is the current hospital active medication list Current Facility-Administered Medications  Medication Dose Route Frequency Provider Last Rate Last Admin  . acetaminophen (TYLENOL) tablet 650 mg  650 mg Oral Q6H PRN Emokpae, Ejiroghene E, MD   650 mg at 09/17/20 0442   Or  . acetaminophen (TYLENOL) suppository 650 mg  650 mg Rectal Q6H PRN Emokpae, Ejiroghene E, MD      . amLODipine (NORVASC) tablet 5 mg  5 mg Oral Daily Emokpae, Ejiroghene E, MD   5 mg at 09/17/20 0857  . apixaban (ELIQUIS) tablet 2.5 mg  2.5 mg Oral BID Emokpae, Ejiroghene E, MD   2.5 mg at 09/17/20 0857  . atorvastatin (LIPITOR) tablet 20 mg  20 mg Oral QHS Emokpae, Ejiroghene E, MD   20 mg at 09/16/20 2208  . clopidogrel (PLAVIX) tablet 75 mg  75 mg Oral Daily Emokpae, Ejiroghene E, MD   75 mg at 09/17/20 0857  . feeding supplement (ENSURE ENLIVE / ENSURE PLUS) liquid 237 mL  237 mL Oral BID BM Emokpae, Courage, MD   237 mL at 09/17/20 1357  . furosemide (LASIX)  injection 40 mg  40 mg Intravenous Daily Emokpae, Courage, MD   40 mg at 09/17/20 0857  . hydrALAZINE (APRESOLINE) tablet 50 mg  50 mg Oral TID Emokpae, Ejiroghene E, MD   50 mg at 09/17/20 0857  . isosorbide dinitrate (ISORDIL) tablet 5 mg  5 mg Oral BID Emokpae, Ejiroghene E, MD   5 mg at 09/17/20 1357  . metoprolol succinate (TOPROL-XL) 24 hr tablet 25 mg  25 mg Oral Daily Emokpae, Ejiroghene E, MD   25 mg at 09/17/20 0857  . ondansetron (ZOFRAN) tablet 4 mg  4 mg Oral Q6H PRN Emokpae, Ejiroghene E, MD       Or  . ondansetron (ZOFRAN) injection 4 mg  4 mg Intravenous Q6H PRN Emokpae, Ejiroghene E,  MD      . pantoprazole (PROTONIX) EC tablet 40 mg  40 mg Oral Daily Emokpae, Ejiroghene E, MD   40 mg at 09/17/20 0857  . polyethylene glycol (MIRALAX / GLYCOLAX) packet 17 g  17 g Oral Daily PRN Emokpae, Ejiroghene E, MD      . prednisoLONE acetate (PRED FORTE) 1 % ophthalmic suspension 1 drop  1 drop Both Eyes Daily Emokpae, Courage, MD   1 drop at 09/17/20 0908     Discharge Medications: Please see discharge summary for a list of discharge medications.  Relevant Imaging Results:  Relevant Lab Results:   Additional Information Pt SSN: [id number]  Natasha Bence, LCSW

## 2020-09-18 DIAGNOSIS — I509 Heart failure, unspecified: Secondary | ICD-10-CM | POA: Diagnosis not present

## 2020-09-18 LAB — CBC
HCT: 26.7 % — ABNORMAL LOW (ref 36.0–46.0)
Hemoglobin: 8.7 g/dL — ABNORMAL LOW (ref 12.0–15.0)
MCH: 28.3 pg (ref 26.0–34.0)
MCHC: 32.6 g/dL (ref 30.0–36.0)
MCV: 87 fL (ref 80.0–100.0)
Platelets: 289 10*3/uL (ref 150–400)
RBC: 3.07 MIL/uL — ABNORMAL LOW (ref 3.87–5.11)
RDW: 14.7 % (ref 11.5–15.5)
WBC: 4.1 10*3/uL (ref 4.0–10.5)
nRBC: 0 % (ref 0.0–0.2)

## 2020-09-18 LAB — FOLATE: Folate: 10.1 ng/mL (ref >5.9)

## 2020-09-18 LAB — FERRITIN: Ferritin: 142 ng/mL (ref 11–307)

## 2020-09-18 LAB — IRON AND TIBC
Iron: 22 ug/dL — ABNORMAL LOW (ref 28–170)
Saturation Ratios: 8 % — ABNORMAL LOW (ref 10.4–31.8)
TIBC: 271 ug/dL (ref 250–450)
UIBC: 249 ug/dL

## 2020-09-18 LAB — RESP PANEL BY RT-PCR (FLU A&B, COVID) ARPGX2
Influenza A by PCR: NEGATIVE
Influenza B by PCR: NEGATIVE
SARS Coronavirus 2 by RT PCR: NEGATIVE

## 2020-09-18 LAB — VITAMIN B12: Vitamin B-12: 387 pg/mL (ref 180–914)

## 2020-09-18 MED ORDER — FERROUS SULFATE 325 (65 FE) MG PO TBEC: 325.0000 mg | DELAYED_RELEASE_TABLET | Freq: Two times a day (BID) | ORAL | 3 refills | Status: DC

## 2020-09-18 MED ORDER — ASPIRIN EC 81 MG PO TBEC: 81.0000 mg | DELAYED_RELEASE_TABLET | Freq: Every day | ORAL | 2 refills | Status: AC

## 2020-09-18 MED ORDER — POLYETHYLENE GLYCOL 3350 17 G PO PACK: 17.0000 g | PACK | Freq: Every day | ORAL | 0 refills | Status: AC | PRN

## 2020-09-18 MED ORDER — ENSURE ENLIVE PO LIQD: 237.0000 mL | Freq: Two times a day (BID) | ORAL | 12 refills | Status: AC

## 2020-09-18 MED ORDER — SENNOSIDES-DOCUSATE SODIUM 8.6-50 MG PO TABS: 2.0000 | ORAL_TABLET | Freq: Every day | ORAL | 1 refills | Status: AC

## 2020-09-18 MED ORDER — HYDRALAZINE HCL 50 MG PO TABS: 50.0000 mg | ORAL_TABLET | Freq: Two times a day (BID) | ORAL | 3 refills | Status: DC

## 2020-09-18 MED ORDER — TORSEMIDE 20 MG PO TABS: 20.0000 mg | ORAL_TABLET | Freq: Every day | ORAL | 3 refills | Status: DC

## 2020-09-18 MED ORDER — APIXABAN 2.5 MG PO TABS: 2.5000 mg | ORAL_TABLET | Freq: Two times a day (BID) | ORAL | 2 refills | Status: AC

## 2020-09-18 MED ORDER — ACETAMINOPHEN 325 MG PO TABS: 650.0000 mg | ORAL_TABLET | Freq: Four times a day (QID) | ORAL | 1 refills | Status: DC | PRN

## 2020-09-18 MED ORDER — ENTRESTO 97-103 MG PO TABS: 0.5000 | ORAL_TABLET | Freq: Two times a day (BID) | ORAL | 2 refills | Status: DC

## 2020-09-18 NOTE — Progress Notes (Signed)
Physical Therapy Treatment Patient Details Name: Julia Hart MRN: [id number] DOB: [birth year] Today's Date: 09/18/2020    History of Present Illness Zyaira Vejar is a 87 y.o. female with PMHx HTN, HLD, CAD s/p stenting, A fib on Eliquis, CHF with EF 65-70%, and recent COVID infection last month who presents to the ED today with complaint of bilateral lower extremity swelling x 1 week. Pt denies missing any doses of any of her medications. She does not feel like she is more short of breath however appears tachypneic today. She denies chest pain. Pt unable to provide an additional history at this time.    PT Comments    Pt uip in chair requesting help to ambulate to toilet for a BM.  Pt a little unsteady upon first standing but able to ambulate to bathroom with contract guard.  PT required cues to control descent to toilet.  Pt able to complete hygiene independently and ambulated back to chair.  Pt completed several seated exercise with cues    Follow Up Recommendations  Supervision for mobility/OOB;SNF                  Precautions / Restrictions Precautions Precautions: Fall    Mobility  Bed Mobility               General bed mobility comments: Pt was sitting up in recliner upon arival    Transfers Overall transfer level: Needs assistance Equipment used: Rolling walker (2 wheeled) Transfers: Sit to/from Stand Sit to Stand: Min assist         General transfer comment: verbal cues to standup tall and push off from bed; controlled descent  Ambulation/Gait Ambulation/Gait assistance: Min guard Gait Distance (Feet): 20 Feet Assistive device: Rolling walker (2 wheeled) Gait Pattern/deviations: Trunk flexed;Decreased step length - right;Decreased step length - left;Step-through pattern;Decreased stride length Gait velocity: decreased   General Gait Details: slow decreased with walker    Wheelchair Mobility    Modified Rankin (Stroke Patients Only)           Cognition Arousal/Alertness: Awake/alert   Overall Cognitive Status: Within Functional Limits for tasks assessed                                        Exercises General Exercises - Lower Extremity Ankle Circles/Pumps: AROM;Both;15 reps Long Arc Quad: AROM;Both;15 reps        Pertinent Vitals/Pain Pain Assessment: No/denies pain           PT Goals (current goals can now be found in the care plan section) Acute Rehab PT Goals Patient Stated Goal: to get stronger Progress towards PT goals: Progressing toward goals    Frequency    Min 3X/week      PT Plan Current plan remains appropriate       AM-PAC PT "6 Clicks" Mobility   Outcome Measure  Help needed turning from your back to your side while in a flat bed without using bedrails?: A Little Help needed moving from lying on your back to sitting on the side of a flat bed without using bedrails?: A Little Help needed moving to and from a bed to a chair (including a wheelchair)?: A Little Help needed standing up from a chair using your arms (e.g., wheelchair or bedside chair)?: A Little Help needed to walk in hospital room?: A Little Help needed climbing 3-5 steps with a railing? :  A Lot 6 Click Score: 17    End of Session Equipment Utilized During Treatment: Gait belt Activity Tolerance: Patient limited by lethargy Patient left: in chair;with call bell/phone within reach   PT Visit Diagnosis: Unsteadiness on feet (R26.81);Other abnormalities of gait and mobility (R26.89);Muscle weakness (generalized) (M62.81);Difficulty in walking, not elsewhere classified (R26.2)     Time: 9969-2493 PT Time Calculation (min) (ACUTE ONLY): 18 min  Charges:  $Therapeutic Activity: 8-22 mins                    Teena Irani, PTA/CLT [phone]    Roseanne Reno B 09/18/2020, 5:21 PM

## 2020-09-18 NOTE — Care Management Important Message (Signed)
Important Message  Patient Details  Name: Julia Hart MRN: [id number] Date of Birth: 1927/05/10   Medicare Important Message Given:  Yes     Tommy Medal 09/18/2020, 12:40 PM

## 2020-09-18 NOTE — TOC Progression Note (Signed)
Transition of Care [facility]) - Progression Note    Patient Details  Name: Julia Hart MRN: [id number] Date of Birth: 1926/11/16  Transition [facility]) CM/SW Contact  Salome Arnt, [place] Phone Number: 09/18/2020, 3:43 PM  Clinical Narrative:  LCSW followed up with [facility] and [place]. Bed offer received from [facility], but [facility] unable to accept pt. [name] notified and accepts bed at [facility]. Awaiting result of COVID test to transfer to SNF. LCSW called lab and result not expected until tomorrow. MD notified and put in 2 hour [place] notified anticipate d/c tomorrow. MD updated pt's niece at bedside.      Expected Discharge Plan: [place] Barriers to Discharge: Continued Medical Work up  Expected Discharge Plan and Services Expected Discharge Plan: [place] In-house Referral: Clinical Social Work Discharge Planning Services: CM Consult Post Acute Care Choice: [place] arrangements for the past 2 months: Single Family Home Expected Discharge Date: 09/18/20               DME Arranged: N/A DME Agency: NA                   Social Determinants of Health (SDOH) Interventions    Readmission Risk Interventions No flowsheet data found.

## 2020-09-18 NOTE — Progress Notes (Signed)
Patient Demographics:    Julia Hart, is a 87 y.o. female, DOB - Oct 08, 1926, EPP:[id number]  Admit date - 09/14/2020   Admitting Physician Ellyn Rubiano Denton Brick, MD  Outpatient Primary MD for the patient is [name], MD  LOS - 3   Chief Complaint  Patient presents with  . Leg Swelling        Subjective:    [name] today has no fevers, no emesis,  No chest pain,   -- Fatigue persist -shob is better  Assessment  & Plan :    Principal Problem:   Decompensated heart failure (HCC) Active Problems:   Acute exacerbation of CHF (congestive heart failure) (HCC)   Anemia   Coronary artery disease   COVID-19 virus infection   Atrial fibrillation, chronic (HCC)   HTN (hypertension)  Brief Summary:- 87 y.o. female with medical history significant for congestive heart failure, atrial fibrillation, hypertension, recent Covid infection admitted on 09/14/2020 with acute on chronic CHF exacerbation.  A/p 1)HFpEF--acute on chronic combined systolic and diastolic dysfunction CHF exacerbation -Shortness of breath overall continues to improve -Echo from 08/04/2020 with EF of 65 to 70% with grade 2 diastolic dysfunction -Continue IV Lasix monitor fluid input and output closely--creatinine down to 1.38 -Hold Entresto on hold Aldactone due to worsening renal failure -  2) chronic atrial fibrillation--- continue Eliquis for stroke prophylaxis and metoprolol for rate control -Risk-benefit of concomitant Eliquis and antiplatelet agent use discussed with patient and patient's niece --They would like to  stop Plavix, they have decided to take aspirin for cardiovascular protection due to prior stent and CABG and they have also decided to take Eliquis/apixaban for stroke prevention due to atrial fibrillation - 3)AKI----acute kidney injury on CKD stage -3A -continue to monitor renal function closely with IV  diuresis -Creatinine was 1.9 on admission, creatinine is down to 1.38  baseline usually around 1.1 - renally adjust medications, avoid nephrotoxic agents / dehydration  / hypotension -Creatinine improving, continue to hold Entresto and Aldactone  4)HTN--- watch BP closely with medication adjustments, okay to continue amlodipine, metoprolol, and isosorbide, patient is on IV Lasix --- for now Entresto and Aldactone are on hold  5)CAD-status post CABG in 2008 and status post stenting in February 2019 at the [facility] ---stable, chest pain-free, continue isosorbide, metoprolol  and Lipitor, aspirin in lieu of Plavix as above in #2  6)Recent COVID-19 infection--- diagnosed August 03, 2020 -Previously treated and appears to be largely asymptomatic from a COVID-19 infection standpoint at this time  7) generalized weakness and deconditioning---  PT eval appreciated, recommends SNF rehab -Patient agreeable  8) left leg discomfort--- lower extremity venous Dopplers with 6 cm Baker's cyst on the left  9) acute on chronic anemia--- ??  CKD related -Stool occult blood negative -B12, folate, ferritin and iron and TIBC noted --Anemia work-up consistent with iron deficiency -Give iron tablets.  This will have to be stopped prior to colonoscopy -Patient has had EGD and capsule study recently without evidence of bleeding -follow-up with gastroenterologist Dr Abbey Chatters --for colonoscopy as previously planned -Avoid NSAIDs   Disposition/Need for in-Hospital Stay- patient unable to be discharged at this time due to -acute CHF exacerbation requiring IV diuresis and close monitoring of renal function in the  setting of AKI on CKD* -Patient needs SNF rehab  Status is: Inpatient  Remains inpatient appropriate because:Please see above   Disposition: The patient is from: Home              Anticipated d/c is to: SNF              Anticipated d/c date is: 1 day              Patient currently is not  medically stable to d/c. Barriers: Not Clinically Stable-   Code Status :  -  Code Status: DNR   Family Communication:    Discussed with niece at bedside  Consults  :    DVT Prophylaxis  :   - SCDs   apixaban (ELIQUIS) tablet 2.5 mg Start: 09/14/20 2200 apixaban (ELIQUIS) tablet 2.5 mg    Lab Results  Component Value Date   PLT 289 09/18/2020    Inpatient Medications  Scheduled Meds: . amLODipine  5 mg Oral Daily  . apixaban  2.5 mg Oral BID  . atorvastatin  20 mg Oral QHS  . clopidogrel  75 mg Oral Daily  . feeding supplement  237 mL Oral BID BM  . furosemide  40 mg Intravenous Daily  . hydrALAZINE  50 mg Oral TID  . isosorbide dinitrate  5 mg Oral BID  . metoprolol succinate  25 mg Oral Daily  . pantoprazole  40 mg Oral Daily  . prednisoLONE acetate  1 drop Both Eyes Daily   Continuous Infusions: PRN Meds:.acetaminophen **OR** acetaminophen, ondansetron **OR** ondansetron (ZOFRAN) IV, polyethylene glycol    Anti-infectives (From admission, onward)   None        Objective:   Vitals:   09/17/20 2115 09/18/20 0614 09/18/20 0959 09/18/20 1416  BP: (!) 120/49 (!) 136/44 (!) 153/52 (!) 163/41  Pulse: 72 (!) 107 63 (!) 58  Resp: 17 18 18 20   Temp: 99 F (37.2 C) 98.7 F (37.1 C)  (!) 97.2 F (36.2 C)  TempSrc: Oral   Oral  SpO2: 97% 99% 100% 100%  Weight:  59.5 kg    Height:        Wt Readings from Last 3 Encounters:  09/18/20 59.5 kg  08/07/20 55.4 kg  07/20/20 58.2 kg    Intake/Output Summary (Last 24 hours) at 09/18/2020 1630 Last data filed at 09/18/2020 1430 Gross per 24 hour  Intake 240 ml  Output 400 ml  Net -160 ml    Physical Exam  Gen:- Awake Alert, no conversational dyspnea  HEENT:- [place].AT, No sclera icterus Neck-Supple Neck,No JVD,.  Lungs-improving air movement, no wheezing  CV- S1, S2 normal, irregular Abd-  +ve B.Sounds, Abd Soft, No tenderness,    Extremity/Skin:- 1+  edema, pedal pulses present  Psych-affect is appropriate,  oriented x3 Neuro-generalized weakness, no new focal deficits, no tremors   Data Review:   Micro Results Recent Results (from the past 240 hour(s))  SARS CORONAVIRUS 2 (TAT 6-24 HRS) Nasopharyngeal Nasopharyngeal Swab     Status: None   Collection Time: 09/14/20  2:14 PM   Specimen: Nasopharyngeal Swab  Result Value Ref Range Status   SARS Coronavirus 2 NEGATIVE NEGATIVE Final    Comment: (NOTE) SARS-CoV-2 target nucleic acids are NOT DETECTED.  The SARS-CoV-2 RNA is generally detectable in upper and lower respiratory specimens during the acute phase of infection. Negative results do not preclude SARS-CoV-2 infection, do not rule out co-infections with other pathogens, and should not be used as  the sole basis for treatment or other patient management decisions. Negative results must be combined with clinical observations, patient history, and epidemiological information. The expected result is Negative.  Fact Sheet for Patients: SugarRoll.be  Fact Sheet for Healthcare Providers: https://www.woods-mathews.com/  This test is not yet approved or cleared by the [place] FDA and  has been authorized for detection and/or diagnosis of SARS-CoV-2 by FDA under an Emergency Use Authorization (EUA). This EUA will remain  in effect (meaning this test can be used) for the duration of the COVID-19 declaration under Se ction 564(b)(1) of the Act, 21 U.S.C. section 360bbb-3(b)(1), unless the authorization is terminated or revoked sooner.  Performed at [facility], [place] [address] [place], [place] [address]     Radiology Reports US Venous Img Lower Bilateral (DVT)  Result Date: 09/16/2020 CLINICAL DATA:  Lower extremity pain and edema.  Evaluate for DVT. EXAM: BILATERAL LOWER EXTREMITY VENOUS DOPPLER ULTRASOUND TECHNIQUE: Gray-scale sonography with graded compression, as well as color Doppler and duplex ultrasound were performed to  evaluate the lower extremity deep venous systems from the level of the common femoral vein and including the common femoral, femoral, profunda femoral, popliteal and calf veins including the posterior tibial, peroneal and gastrocnemius veins when visible. The superficial great saphenous vein was also interrogated. Spectral Doppler was utilized to evaluate flow at rest and with distal augmentation maneuvers in the common femoral, femoral and popliteal veins. COMPARISON:  None. FINDINGS: RIGHT LOWER EXTREMITY Common Femoral Vein: No evidence of thrombus. Normal compressibility, respiratory phasicity and response to augmentation. Saphenofemoral Junction: No evidence of thrombus. Normal compressibility and flow on color Doppler imaging. Profunda Femoral Vein: No evidence of thrombus. Normal compressibility and flow on color Doppler imaging. Femoral Vein: No evidence of thrombus. Normal compressibility, respiratory phasicity and response to augmentation. Popliteal Vein: No evidence of thrombus. Normal compressibility, respiratory phasicity and response to augmentation. Calf Veins: No evidence of thrombus. Normal compressibility and flow on color Doppler imaging. Superficial Great Saphenous Vein: No evidence of thrombus. Normal compressibility. Venous Reflux:  None. Other Findings:  None. LEFT LOWER EXTREMITY Common Femoral Vein: No evidence of thrombus. Normal compressibility, respiratory phasicity and response to augmentation. Saphenofemoral Junction: No evidence of thrombus. Normal compressibility and flow on color Doppler imaging. Profunda Femoral Vein: No evidence of thrombus. Normal compressibility and flow on color Doppler imaging. Femoral Vein: No evidence of thrombus. Normal compressibility, respiratory phasicity and response to augmentation. Popliteal Vein: No evidence of thrombus. Normal compressibility, respiratory phasicity and response to augmentation. Calf Veins: No evidence of thrombus. Normal  compressibility and flow on color Doppler imaging. Superficial Great Saphenous Vein: No evidence of thrombus. Normal compressibility. Venous Reflux:  None. Other Findings: Note is made of an approximately 6.1 x 3.0 x 1.1 cm minimally complex fluid collection within the left popliteal fossa compatible with a Baker's cyst. IMPRESSION: 1. No evidence of DVT within either lower extremity. 2. Incidental note made of approximately 6.1 cm minimally complex left-sided Baker's cyst. Electronically Signed   By: Sandi Mariscal M.D.   On: 09/16/2020 14:22   DG Chest Port 1 View  Result Date: 09/14/2020 CLINICAL DATA:  Leg swelling EXAM: PORTABLE CHEST 1 VIEW COMPARISON:  Portable exam 1322 hours compared to 08/03/2020 FINDINGS: And mild enlargement of cardiac silhouette post CABG. Mediastinal contours and pulmonary vascularity normal. Atherosclerotic calcification aorta. Lungs clear. No infiltrate, pleural effusion, or pneumothorax. Bones demineralized. Probable RIGHT nipple shadow. Skin folds project over RIGHT lung. IMPRESSION: Enlargement of cardiac silhouette  post CABG. No acute abnormalities. Aortic Atherosclerosis (ICD10-I70.0). Electronically Signed   By: Lavonia Dana M.D.   On: 09/14/2020 14:23     CBC Recent Labs  Lab 09/14/20 1307 09/17/20 0618 09/18/20 0454  WBC 6.5 4.2 4.1  HGB 9.8* 8.3* 8.7*  HCT 29.9* 25.5* 26.7*  PLT 191 263 289  MCV 87.9 87.9 87.0  MCH 28.8 28.6 28.3  MCHC 32.8 32.5 32.6  RDW 14.7 14.8 14.7  LYMPHSABS 0.9  --   --   MONOABS 1.0  --   --   EOSABS 0.0  --   --   BASOSABS 0.0  --   --     Chemistries  Recent Labs  Lab 09/14/20 1307 09/15/20 0633 09/16/20 0634 09/17/20 0618  NA 133* 137 137 135  K 3.5 3.1* 4.5 4.1  CL 100 103 106 104  CO2 22 23 25 25   GLUCOSE 125* 103* 89 86  BUN 49* 49* 45* 41*  CREATININE 1.99* 1.83* 1.51* 1.38*  CALCIUM 8.1* 8.1* 7.9* 8.0*  AST 58*  --   --   --   ALT 23  --   --   --   ALKPHOS 132*  --   --   --   BILITOT 1.1  --   --    --    No results for input(s): CHOL, HDL, LDLCALC, TRIG, CHOLHDL, LDLDIRECT in the last 72 hours.  No results found for: HGBA1C ------------------------------------------------------------------------------------------------------------------ No results for input(s): TSH, T4TOTAL, T3FREE, THYROIDAB in the last 72 hours.  Invalid input(s): FREET3 ------------------------------------------------------------------------------------------------------------------ Recent Labs    09/18/20 0454  VITAMINB12 387  FOLATE 10.1  FERRITIN 142  TIBC 271  IRON 22*   Coagulation profile No results for input(s): INR, PROTIME in the last 168 hours.  No results for input(s): DDIMER in the last 72 hours.  Cardiac Enzymes No results for input(s): CKMB, TROPONINI, MYOGLOBIN in the last 168 hours.  Invalid input(s): CK ------------------------------------------------------------------------------------------------------------------    Component Value Date/Time   BNP 4,227.0 (H) 09/14/2020 1307   Roxan Hockey M.D on 09/18/2020 at 4:30 PM  Go to [email or web address] - for contact info  [facility] - Office  [phone]

## 2020-09-19 ENCOUNTER — Encounter (HOSPITAL_COMMUNITY): Admission: RE | Payer: Self-pay | Source: Home / Self Care

## 2020-09-19 ENCOUNTER — Ambulatory Visit (HOSPITAL_COMMUNITY): Admission: RE | Admit: 2020-09-19 | Payer: Medicare Other | Source: Home / Self Care

## 2020-09-19 DIAGNOSIS — I509 Heart failure, unspecified: Secondary | ICD-10-CM | POA: Diagnosis not present

## 2020-09-19 LAB — RESPIRATORY PANEL BY PCR

## 2020-09-19 SURGERY — COLONOSCOPY WITH PROPOFOL
Anesthesia: Monitor Anesthesia Care

## 2020-09-19 NOTE — Discharge Summary (Signed)
Julia Hart, is a 87 y.o. female  DOB 31-Mar-1927  MRN [id number].  Admission date:  09/14/2020  Admitting Physician  Roxan Hockey, MD  Discharge Date:  09/19/2020   Primary MD  [name], MD  Recommendations for primary care physician for things to follow:   1)Avoid ibuprofen/Advil/Aleve/Motrin/Goody Powders/Naproxen/BC powders/Meloxicam/Diclofenac/Indomethacin and other Nonsteroidal anti-inflammatory medications as these will make you more likely to bleed and can cause stomach ulcers, can also cause Kidney problems.   2) follow-up with gastroenterologist Dr Abbey Chatters --for colonoscopy as previously planned  3) repeat CBC and BMP on Friday, 09/22/2020  4) please note that there has been several changes to your medications--- you have decided to stop Plavix, you have decided to take aspirin for cardiovascular protection due to prior stent and CABG and you have also decided to take Eliquis/apixaban for stroke prevention due to atrial fibrillation  5) you will need to stop the iron tablets prior to your colonoscopy  Admission Diagnosis  CHF exacerbation ([place]) [I50.9] AKI (acute kidney injury) ([place]) [N17.9] Acute on chronic congestive heart failure, unspecified heart failure type ([place]) [I50.9] Acute exacerbation of CHF (congestive heart failure) ([place]) [I50.9]   Discharge Diagnosis  CHF exacerbation ([place]) [I50.9] AKI (acute kidney injury) ([place]) [N17.9] Acute on chronic congestive heart failure, unspecified heart failure type ([place]) [I50.9] Acute exacerbation of CHF (congestive heart failure) ([place]) [I50.9]    Principal Problem:   Decompensated heart failure ([place]) Active Problems:   Acute exacerbation of CHF (congestive heart failure) ([place])   Anemia   Coronary artery disease   COVID-19 virus infection   Atrial fibrillation, chronic (HCC)   HTN (hypertension)      Past Medical History:  Diagnosis  Date  . CAD (coronary artery disease)   . HTN (hypertension)   . Hyperlipidemia     Past Surgical History:  Procedure Laterality Date  . ABDOMINAL HYSTERECTOMY    . BIOPSY  01/13/2017   Procedure: BIOPSY;  Surgeon: Danie Binder, MD;  [facility];  Service: Endoscopy;;  duodenal gastric   . COLONOSCOPY N/A 01/13/2017   Procedure: COLONOSCOPY;  Surgeon: Danie Binder, MD;  [facility];  Service: Endoscopy;  Laterality: N/A;  3:00pm-moved to 1:45pm per [place]  . CORONARY ANGIOPLASTY WITH STENT PLACEMENT  2018  . CORONARY ARTERY BYPASS GRAFT  2008  . ESOPHAGOGASTRODUODENOSCOPY N/A 01/13/2017   Procedure: ESOPHAGOGASTRODUODENOSCOPY (EGD);  Surgeon: Danie Binder, MD;  [facility];  Service: Endoscopy;  Laterality: N/A;  . ESOPHAGOGASTRODUODENOSCOPY (EGD) WITH PROPOFOL N/A 06/29/2020   Procedure: ESOPHAGOGASTRODUODENOSCOPY (EGD) WITH PROPOFOL;  Surgeon: Daneil Dolin, MD;  [facility];  Service: Endoscopy;  Laterality: N/A;  1:45pm  . GIVENS CAPSULE STUDY N/A 09/05/2020   Procedure: GIVENS CAPSULE STUDY;  Surgeon: Eloise Harman, DO;  [facility];  Service: Endoscopy;  Laterality: N/A;  7:30am     HPI  from the history and physical done on the day of admission:    Chief Complaint: Leg swelling  HPI: Julia Hart is a 87 y.o. female with medical history significant for congestive heart failure, atrial fibrillation, hypertension, recent Covid infection. Patient was brought to the ED with reports of bilateral lower extremity swelling of 1 week duration.  She denies difficulty breathing, no orthopnea, no chest pain.  Nephew is present at bedside.  He reports patient has caregivers at home and patient has been very compliant with all her medications which include torsemide 20mg  daily.  Hospitalization here 1/13 - 1/17 for decompensated CHF and COVID-19 infection.  Was diuresed with IV Lasix, and discharged home with change in  her diuretic dose from 20 mg of torsemide every other day to daily.   Patient was also just hospitalized at [facility]/20 - 2/21, for same, but nephew said patient swelling did not improve after hospitalization.  ED Course: Stable vitals.  O2 sats greater than 96% on room air.  BNP markedly elevated at 4227, troponin II 194, creatinine elevated 1.99.  Portable chest x-ray without acute abnormality.  40 mg IV Lasix given.  Hospitalist to admit for decompensated CHF.  Review of Systems: As per HPI all other systems reviewed and negative   Hospital Course:   Brief Summary:- 87 y.o.femalewith medical history significant forcongestive heart failure, atrial fibrillation, hypertension, recent Covid infection admitted on 09/14/2020 with acute on chronic CHF exacerbation.  A/p 1)HFpEF--acute on chronic combined systolic and diastolic dysfunction CHF exacerbation -Shortness of breath overall continues to improve with iv Lasix/diuresis -Echo from 08/04/2020 with EF of 65 to 70% with grade 2 diastolic dysfunction -Okay to restart Entresto at half dose -Stop Aldactone due to kidney concerns -Restart torsemide- -repeat BMP on 09/22/2020 and  weekly after that  2) chronic atrial fibrillation--- continue Eliquis for stroke prophylaxis and metoprolol for rate control -Risk-benefit of concomitant Eliquis and antiplatelet agent use discussed with patient and patient's niece --They would like to  stop Plavix, they have decided to take aspirin for cardiovascular protection due to prior stent and CABG and they have also decided to take Eliquis/apixaban for stroke prevention due to atrial fibrillation -  3)AKI----acute kidney injury on CKD stage -3A -continue to monitor renal function closely with IV diuresis -Creatinine was 1.9 on admission, creatinine is down to 1.38  baseline usually around 1.1 - renally adjust medications, avoid nephrotoxic agents / dehydration  / hypotension -Entresto and Aldactone  adjustments as above in # 1  4)HTN--- watch BP closely with medication adjustments,  - continue amlodipine, metoprolol, and isosorbide  --Entresto and Aldactone adjustments as above in # 1  5)CAD-status post 6 vessel CABG in 2008 and status post stenting in February 2019 at [facility] ---stable, chest pain-free, continue isosorbide, metoprolol  and Lipitor, aspirin in lieu of Plavix as above in #2  6)Recent COVID-19 infection--- diagnosed August 03, 2020 -Previously treated and appears to be largely asymptomatic from a COVID-19 infection standpoint at this time -Repeat Covid test is negative  7)Generalized weakness and deconditioning---  PT eval appreciated, recommends SNF rehab -Patient agreeable  8) left leg discomfort--- lower extremity venous Dopplers with 6 cm Baker's cyst on the left  9) acute on chronic iron deficiency anemia--- ??  CKD related -Stool occult blood negative -B12, folate, ferritin and iron and TIBC noted --Anemia work-up consistent with iron deficiency -Give iron tablets- This will have to be stopped prior to colonoscopy -Patient has had EGD and capsule study recently without evidence of bleeding -follow-up with gastroenterologist Dr Abbey Chatters --for colonoscopy as previously planned -Avoid NSAIDs  Disposition/--discharge to SNF rehab  Disposition: The patient is from: Home  Anticipated d/c is to: SNF  Code Status :  -  Code Status: DNR   Family Communication:    Discussed with niece at bedside  Discharge Condition: stable  Follow UP   Contact information for follow-up providers    Eloise Harman, DO. Schedule an appointment as soon as possible for a visit.   Specialty: Internal Medicine Why: Gastroenterologist for Colonoscopy Contact information: [address] [address] [phone]            Contact information for after-discharge care    [facility] .   Service: Skilled Nursing Contact information: [address] [place]                  Diet and Activity recommendation:  As advised  Discharge Instructions    Discharge Instructions    Call MD for:  difficulty breathing, headache or visual disturbances   Complete by: As directed    Call MD for:  persistant dizziness or light-headedness   Complete by: As directed    Call MD for:  persistant nausea and vomiting   Complete by: As directed    Call MD for:  severe uncontrolled pain   Complete by: As directed    Call MD for:  temperature >100.4   Complete by: As directed    Diet - low sodium heart healthy   Complete by: As directed    Discharge instructions   Complete by: As directed    1)Avoid ibuprofen/Advil/Aleve/Motrin/Goody Powders/Naproxen/BC powders/Meloxicam/Diclofenac/Indomethacin and other Nonsteroidal anti-inflammatory medications as these will make you more likely to bleed and can cause stomach ulcers, can also cause Kidney problems.   2) follow-up with gastroenterologist Dr Abbey Chatters --for colonoscopy as previously planned  3) repeat CBC and BMP on Friday, 09/22/2020  4) please note that there has been several changes to your medications--- you have decided to stop Plavix, you have decided to take aspirin for cardiovascular protection due to prior stent and CABG and you have also decided to take Eliquis/apixaban for stroke prevention due to atrial fibrillation  5) you will need to stop the iron tablets prior to your colonoscopy   Increase activity slowly   Complete by: As directed        Discharge Medications     Allergies as of 09/19/2020   No Known Allergies     Medication List    STOP taking these medications   clopidogrel 75 MG tablet Commonly known as: PLAVIX   docusate sodium 100 MG capsule Commonly known as: COLACE   metoprolol tartrate 25 MG tablet Commonly known as: LOPRESSOR   spironolactone 25 MG  tablet Commonly known as: ALDACTONE     TAKE these medications   acetaminophen 325 MG tablet Commonly known as: TYLENOL Take 2 tablets (650 mg total) by mouth every 6 (six) hours as needed for mild pain, fever or headache (or Fever >/= 101).   amLODipine 5 MG tablet Commonly known as: NORVASC Take 5 mg by mouth daily.   apixaban 2.5 MG Tabs tablet Commonly known as: ELIQUIS Take 1 tablet (2.5 mg total) by mouth 2 (two) times daily.   aspirin EC 81 MG tablet Take 1 tablet (81 mg total) by mouth daily with breakfast.   atorvastatin 20 MG tablet Commonly known as: LIPITOR Take 20 mg by mouth at bedtime.   Entresto 97-103 MG Generic  drug: sacubitril-valsartan Take 0.5 tablets by mouth 2 (two) times daily. What changed: how much to take   feeding supplement Liqd Take 237 mLs by mouth 2 (two) times daily.   ferrous sulfate 325 (65 FE) MG EC tablet Take 1 tablet (325 mg total) by mouth 2 (two) times daily with a meal.   guaiFENesin 600 MG 12 hr tablet Commonly known as: MUCINEX Take 600 mg by mouth 2 (two) times daily.   hydrALAZINE 50 MG tablet Commonly known as: APRESOLINE Take 1 tablet (50 mg total) by mouth 2 (two) times daily. What changed: when to take this   isosorbide dinitrate 5 MG tablet Commonly known as: ISORDIL Take 5 mg by mouth 2 (two) times daily.   Jardiance 10 MG Tabs tablet Generic drug: empagliflozin Take 10 mg by mouth daily.   metoprolol succinate 25 MG 24 hr tablet Commonly known as: TOPROL-XL Take 25 mg by mouth daily.   nitroGLYCERIN 0.4 MG SL tablet Commonly known as: NITROSTAT Place 0.4 mg under the tongue every 5 (five) minutes as needed for chest pain.   pantoprazole 40 MG tablet Commonly known as: PROTONIX Take 40 mg by mouth daily.   polyethylene glycol 17 g packet Commonly known as: MIRALAX / GLYCOLAX Take 17 g by mouth daily as needed for mild constipation.   prednisoLONE acetate 1 % ophthalmic suspension Commonly known  as: PRED FORTE Place 1 drop into both eyes every other day.   senna-docusate 8.6-50 MG tablet Commonly known as: Senokot-S Take 2 tablets by mouth at bedtime.   torsemide 20 MG tablet Commonly known as: Demadex Take 1 tablet (20 mg total) by mouth daily.       Major procedures and Radiology Reports - PLEASE review detailed and final reports for all details, in brief -   US Venous Img Lower Bilateral (DVT)  Result Date: 09/16/2020 CLINICAL DATA:  Lower extremity pain and edema.  Evaluate for DVT. EXAM: BILATERAL LOWER EXTREMITY VENOUS DOPPLER ULTRASOUND TECHNIQUE: Gray-scale sonography with graded compression, as well as color Doppler and duplex ultrasound were performed to evaluate the lower extremity deep venous systems from the level of the common femoral vein and including the common femoral, femoral, profunda femoral, popliteal and calf veins including the posterior tibial, peroneal and gastrocnemius veins when visible. The superficial great saphenous vein was also interrogated. Spectral Doppler was utilized to evaluate flow at rest and with distal augmentation maneuvers in the common femoral, femoral and popliteal veins. COMPARISON:  None. FINDINGS: RIGHT LOWER EXTREMITY Common Femoral Vein: No evidence of thrombus. Normal compressibility, respiratory phasicity and response to augmentation. Saphenofemoral Junction: No evidence of thrombus. Normal compressibility and flow on color Doppler imaging. Profunda Femoral Vein: No evidence of thrombus. Normal compressibility and flow on color Doppler imaging. Femoral Vein: No evidence of thrombus. Normal compressibility, respiratory phasicity and response to augmentation. Popliteal Vein: No evidence of thrombus. Normal compressibility, respiratory phasicity and response to augmentation. Calf Veins: No evidence of thrombus. Normal compressibility and flow on color Doppler imaging. Superficial Great Saphenous Vein: No evidence of thrombus. Normal  compressibility. Venous Reflux:  None. Other Findings:  None. LEFT LOWER EXTREMITY Common Femoral Vein: No evidence of thrombus. Normal compressibility, respiratory phasicity and response to augmentation. Saphenofemoral Junction: No evidence of thrombus. Normal compressibility and flow on color Doppler imaging. Profunda Femoral Vein: No evidence of thrombus. Normal compressibility and flow on color Doppler imaging. Femoral Vein: No evidence of thrombus. Normal compressibility, respiratory phasicity and response to augmentation. Popliteal Vein: No  evidence of thrombus. Normal compressibility, respiratory phasicity and response to augmentation. Calf Veins: No evidence of thrombus. Normal compressibility and flow on color Doppler imaging. Superficial Great Saphenous Vein: No evidence of thrombus. Normal compressibility. Venous Reflux:  None. Other Findings: Note is made of an approximately 6.1 x 3.0 x 1.1 cm minimally complex fluid collection within the left popliteal fossa compatible with a Baker's cyst. IMPRESSION: 1. No evidence of DVT within either lower extremity. 2. Incidental note made of approximately 6.1 cm minimally complex left-sided Baker's cyst. Electronically Signed   By: Sandi Mariscal M.D.   On: 09/16/2020 14:22   DG Chest Port 1 View  Result Date: 09/14/2020 CLINICAL DATA:  Leg swelling EXAM: PORTABLE CHEST 1 VIEW COMPARISON:  Portable exam 1322 hours compared to 08/03/2020 FINDINGS: And mild enlargement of cardiac silhouette post CABG. Mediastinal contours and pulmonary vascularity normal. Atherosclerotic calcification aorta. Lungs clear. No infiltrate, pleural effusion, or pneumothorax. Bones demineralized. Probable RIGHT nipple shadow. Skin folds project over RIGHT lung. IMPRESSION: Enlargement of cardiac silhouette post CABG. No acute abnormalities. Aortic Atherosclerosis (ICD10-I70.0). Electronically Signed   By: Lavonia Dana M.D.   On: 09/14/2020 14:23    Micro Results   Recent Results (from  the past 240 hour(s))  SARS CORONAVIRUS 2 (TAT 6-24 HRS) Nasopharyngeal Nasopharyngeal Swab     Status: None   Collection Time: 09/14/20  2:14 PM   Specimen: Nasopharyngeal Swab  Result Value Ref Range Status   SARS Coronavirus 2 NEGATIVE NEGATIVE Final    Comment: (NOTE) SARS-CoV-2 target nucleic acids are NOT DETECTED.  The SARS-CoV-2 RNA is generally detectable in upper and lower respiratory specimens during the acute phase of infection. Negative results do not preclude SARS-CoV-2 infection, do not rule out co-infections with other pathogens, and should not be used as the sole basis for treatment or other patient management decisions. Negative results must be combined with clinical observations, patient history, and epidemiological information. The expected result is Negative.  Fact Sheet for Patients: SugarRoll.be  Fact Sheet for Healthcare Providers: https://www.woods-mathews.com/  This test is not yet approved or cleared by the [place] FDA and  has been authorized for detection and/or diagnosis of SARS-CoV-2 by FDA under an Emergency Use Authorization (EUA). This EUA will remain  in effect (meaning this test can be used) for the duration of the COVID-19 declaration under Se ction 564(b)(1) of the Act, 21 U.S.C. section 360bbb-3(b)(1), unless the authorization is terminated or revoked sooner.  Performed at [facility], [place] [address] [place], [place] [address]   Resp Panel by RT-PCR (Flu A&B, Covid) Nasopharyngeal Swab     Status: None   Collection Time: 09/18/20  5:58 PM   Specimen: Nasopharyngeal Swab; Nasopharyngeal(NP) swabs in vial transport medium  Result Value Ref Range Status   SARS Coronavirus 2 by RT PCR NEGATIVE NEGATIVE Final    Comment: (NOTE) SARS-CoV-2 target nucleic acids are NOT DETECTED.  The SARS-CoV-2 RNA is generally detectable in upper respiratory specimens during the acute phase of infection.  The lowest concentration of SARS-CoV-2 viral copies this assay can detect is 138 copies/mL. A negative result does not preclude SARS-Cov-2 infection and should not be used as the sole basis for treatment or other patient management decisions. A negative result may occur with  improper specimen collection/handling, submission of specimen other than nasopharyngeal swab, presence of viral mutation(s) within the areas targeted by this assay, and inadequate number of viral copies(<138 copies/mL). A negative result must be combined with clinical  observations, patient history, and epidemiological information. The expected result is Negative.  Fact Sheet for Patients:  EntrepreneurPulse.com.au  Fact Sheet for Healthcare Providers:  IncredibleEmployment.be  This test is no t yet approved or cleared by the [place] FDA and  has been authorized for detection and/or diagnosis of SARS-CoV-2 by FDA under an Emergency Use Authorization (EUA). This EUA will remain  in effect (meaning this test can be used) for the duration of the COVID-19 declaration under Section 564(b)(1) of the Act, 21 U.S.C.section 360bbb-3(b)(1), unless the authorization is terminated  or revoked sooner.       Influenza A by PCR NEGATIVE NEGATIVE Final   Influenza B by PCR NEGATIVE NEGATIVE Final    Comment: (NOTE) The Xpert Xpress SARS-CoV-2/FLU/RSV plus assay is intended as an aid in the diagnosis of influenza from Nasopharyngeal swab specimens and should not be used as a sole basis for treatment. Nasal washings and aspirates are unacceptable for Xpert Xpress SARS-CoV-2/FLU/RSV testing.  Fact Sheet for Patients: EntrepreneurPulse.com.au  Fact Sheet for Healthcare Providers: IncredibleEmployment.be  This test is not yet approved or cleared by the [place] FDA and has been authorized for detection and/or diagnosis of SARS-CoV-2 by FDA  under an Emergency Use Authorization (EUA). This EUA will remain in effect (meaning this test can be used) for the duration of the COVID-19 declaration under Section 564(b)(1) of the Act, 21 U.S.C. section 360bbb-3(b)(1), unless the authorization is terminated or revoked.  Performed at [facility], [address], [place], [place] [address]        Today   Subjective    Lark Langenfeld today has no new complaints No fever  Or chills   No Nausea, Vomiting or Diarrhea        Patient has been seen and examined prior to discharge   Objective   Blood pressure (!) 118/37, pulse (!) 53, temperature 99.1 F (37.3 C), temperature source Oral, resp. rate 18, height 5\' 6"  (1.676 m), weight 59.5 kg, SpO2 97 %.   Intake/Output Summary (Last 24 hours) at 09/19/2020 0830 Last data filed at 09/19/2020 0500 Gross per 24 hour  Intake 120 ml  Output 600 ml  Net -480 ml    Exam Gen:- Awake Alert, no conversational dyspnea  HEENT:- .AT, No sclera icterus Neck-Supple Neck,No JVD,.  Lungs-improving air movement, no wheezing  CV- S1, S2 normal, irregular Abd-  +ve B.Sounds, Abd Soft, No tenderness,    Extremity/Skin:-No significant edema, pedal pulses present  Psych-affect is appropriate, oriented x3 Neuro-generalized weakness, no new focal deficits, no tremors   Data Review   CBC w Diff:  Lab Results  Component Value Date   WBC 4.1 09/18/2020   HGB 8.7 (L) 09/18/2020   HGB 9.6 (L) 07/20/2020   HCT 26.7 (L) 09/18/2020   HCT 30.2 (L) 07/20/2020   PLT 289 09/18/2020   PLT 197 07/20/2020   LYMPHOPCT 14 09/14/2020   MONOPCT 15 09/14/2020   EOSPCT 0 09/14/2020   BASOPCT 0 09/14/2020    CMP:  Lab Results  Component Value Date   NA 135 09/17/2020   K 4.1 09/17/2020   CL 104 09/17/2020   CO2 25 09/17/2020   BUN 41 (H) 09/17/2020   CREATININE 1.38 (H) 09/17/2020   PROT 7.4 09/14/2020   ALBUMIN 2.2 (L) 09/17/2020   BILITOT 1.1 09/14/2020   ALKPHOS 132 (H) 09/14/2020   AST  58 (H) 09/14/2020   ALT 23 09/14/2020  .   Total Discharge time is about 40  minutes  Roxan Hockey M.D on 09/19/2020 at 8:30 AM  Go to [email or web address] -  for contact info  [facility] - Office  [phone]

## 2020-09-19 NOTE — Progress Notes (Signed)
Has been ambulating with steady pace and standby today.  No edema in lower extremities and no SOB on room air.  IV removed and discharge instructions reviewed.  Attempted to call report to [facility] and left voicemail.  Niece to transport there.

## 2020-09-19 NOTE — TOC Transition Note (Signed)
Transition of Care [facility]) - CM/SW Discharge Note   Patient Details  Name: Latonja Bobeck MRN: [id number] Date of Birth: Jul 22, 1927  [facility]) CM/SW Contact:  Salome Arnt, Aneta Phone Number: 09/19/2020, 9:35 AM   Clinical Narrative:  Pt d/c today to [facility]. D/C clinicals sent on hub. [name], Hassan Rowan and facility aware and agreeable. Hassan Rowan will pick up pt and transport to SNF. RN given number to call report. COVID negative 09/18/20. [place] notified.      Final next level of care: Skilled Nursing Facility Barriers to Discharge: Barriers Resolved   Patient Goals and CMS Choice Patient states their goals for this hospitalization and ongoing recovery are:: Go home with [facility] if needed. CMS Medicare.gov Compare Post Acute Care list provided to:: Patient Choice offered to / list presented to : Patient  Discharge Placement              Patient chooses bed at: [facility] Patient to be transferred to facility by: family Name of family member notified: Niece- Hassan Rowan Patient and family notified of of transfer: 09/19/20  Discharge Plan and Services In-house Referral: Clinical Social Work Discharge Planning Services: [employer] Consult Post Acute Care Choice: Home Health          DME Arranged: N/A DME Agency: NA                  Social Determinants of Health (SDOH) Interventions     Readmission Risk Interventions No flowsheet data found.

## 2020-10-25 ENCOUNTER — Ambulatory Visit: Payer: Medicare Other | Admitting: Internal Medicine

## 2020-11-20 ENCOUNTER — Encounter (HOSPITAL_COMMUNITY): Payer: Self-pay | Admitting: *Deleted

## 2020-11-20 ENCOUNTER — Inpatient Hospital Stay (HOSPITAL_COMMUNITY)
Admission: EM | Admit: 2020-11-20 | Discharge: 2020-11-29 | DRG: 291 | Disposition: A | Discharge status: Home / Self Care | Payer: Medicare Other | Attending: Internal Medicine | Admitting: Internal Medicine

## 2020-11-20 ENCOUNTER — Emergency Department (HOSPITAL_COMMUNITY): Payer: Medicare Other

## 2020-11-20 ENCOUNTER — Other Ambulatory Visit: Payer: Self-pay

## 2020-11-20 DIAGNOSIS — I083 Combined rheumatic disorders of mitral, aortic and tricuspid valves: Secondary | ICD-10-CM | POA: Diagnosis present

## 2020-11-20 DIAGNOSIS — R188 Other ascites: Secondary | ICD-10-CM | POA: Diagnosis present

## 2020-11-20 DIAGNOSIS — I255 Ischemic cardiomyopathy: Secondary | ICD-10-CM | POA: Diagnosis present

## 2020-11-20 DIAGNOSIS — Z7982 Long term (current) use of aspirin: Secondary | ICD-10-CM

## 2020-11-20 DIAGNOSIS — I493 Ventricular premature depolarization: Secondary | ICD-10-CM | POA: Diagnosis present

## 2020-11-20 DIAGNOSIS — I252 Old myocardial infarction: Secondary | ICD-10-CM

## 2020-11-20 DIAGNOSIS — I130 Hypertensive heart and chronic kidney disease with heart failure and stage 1 through stage 4 chronic kidney disease, or unspecified chronic kidney disease: Secondary | ICD-10-CM | POA: Diagnosis not present

## 2020-11-20 DIAGNOSIS — K219 Gastro-esophageal reflux disease without esophagitis: Secondary | ICD-10-CM | POA: Diagnosis present

## 2020-11-20 DIAGNOSIS — N1832 Chronic kidney disease, stage 3b: Secondary | ICD-10-CM | POA: Diagnosis present

## 2020-11-20 DIAGNOSIS — I509 Heart failure, unspecified: Secondary | ICD-10-CM | POA: Diagnosis not present

## 2020-11-20 DIAGNOSIS — N183 Chronic kidney disease, stage 3 unspecified: Secondary | ICD-10-CM

## 2020-11-20 DIAGNOSIS — Z8616 Personal history of COVID-19: Secondary | ICD-10-CM

## 2020-11-20 DIAGNOSIS — I313 Pericardial effusion (noninflammatory): Secondary | ICD-10-CM | POA: Diagnosis present

## 2020-11-20 DIAGNOSIS — Z951 Presence of aortocoronary bypass graft: Secondary | ICD-10-CM

## 2020-11-20 DIAGNOSIS — Z6823 Body mass index (BMI) 23.0-23.9, adult: Secondary | ICD-10-CM

## 2020-11-20 DIAGNOSIS — I4819 Other persistent atrial fibrillation: Secondary | ICD-10-CM | POA: Diagnosis present

## 2020-11-20 DIAGNOSIS — Z66 Do not resuscitate: Secondary | ICD-10-CM | POA: Diagnosis present

## 2020-11-20 DIAGNOSIS — Z20822 Contact with and (suspected) exposure to covid-19: Secondary | ICD-10-CM | POA: Diagnosis present

## 2020-11-20 DIAGNOSIS — E441 Mild protein-calorie malnutrition: Secondary | ICD-10-CM | POA: Diagnosis present

## 2020-11-20 DIAGNOSIS — K5900 Constipation, unspecified: Secondary | ICD-10-CM | POA: Diagnosis present

## 2020-11-20 DIAGNOSIS — I5082 Biventricular heart failure: Secondary | ICD-10-CM | POA: Diagnosis present

## 2020-11-20 DIAGNOSIS — I2510 Atherosclerotic heart disease of native coronary artery without angina pectoris: Secondary | ICD-10-CM | POA: Diagnosis present

## 2020-11-20 DIAGNOSIS — I248 Other forms of acute ischemic heart disease: Secondary | ICD-10-CM | POA: Diagnosis present

## 2020-11-20 DIAGNOSIS — Z79899 Other long term (current) drug therapy: Secondary | ICD-10-CM

## 2020-11-20 DIAGNOSIS — E871 Hypo-osmolality and hyponatremia: Secondary | ICD-10-CM | POA: Diagnosis present

## 2020-11-20 DIAGNOSIS — E876 Hypokalemia: Secondary | ICD-10-CM | POA: Diagnosis not present

## 2020-11-20 DIAGNOSIS — N179 Acute kidney failure, unspecified: Secondary | ICD-10-CM | POA: Diagnosis present

## 2020-11-20 DIAGNOSIS — R778 Other specified abnormalities of plasma proteins: Secondary | ICD-10-CM | POA: Diagnosis present

## 2020-11-20 DIAGNOSIS — E785 Hyperlipidemia, unspecified: Secondary | ICD-10-CM | POA: Diagnosis present

## 2020-11-20 DIAGNOSIS — Z955 Presence of coronary angioplasty implant and graft: Secondary | ICD-10-CM

## 2020-11-20 DIAGNOSIS — R0602 Shortness of breath: Secondary | ICD-10-CM

## 2020-11-20 DIAGNOSIS — E119 Type 2 diabetes mellitus without complications: Secondary | ICD-10-CM

## 2020-11-20 DIAGNOSIS — Z7984 Long term (current) use of oral hypoglycemic drugs: Secondary | ICD-10-CM

## 2020-11-20 DIAGNOSIS — E1122 Type 2 diabetes mellitus with diabetic chronic kidney disease: Secondary | ICD-10-CM | POA: Diagnosis present

## 2020-11-20 DIAGNOSIS — I5043 Acute on chronic combined systolic (congestive) and diastolic (congestive) heart failure: Secondary | ICD-10-CM | POA: Diagnosis present

## 2020-11-20 DIAGNOSIS — D72819 Decreased white blood cell count, unspecified: Secondary | ICD-10-CM | POA: Diagnosis present

## 2020-11-20 DIAGNOSIS — Z87891 Personal history of nicotine dependence: Secondary | ICD-10-CM

## 2020-11-20 LAB — CBC WITH DIFFERENTIAL/PLATELET
Abs Immature Granulocytes: 0 10*3/uL (ref 0.00–0.07)
Basophils Absolute: 0 10*3/uL (ref 0.0–0.1)
Basophils Relative: 1 %
Eosinophils Absolute: 0.1 10*3/uL (ref 0.0–0.5)
Eosinophils Relative: 5 %
HCT: 28.8 % — ABNORMAL LOW (ref 36.0–46.0)
Hemoglobin: 9.4 g/dL — ABNORMAL LOW (ref 12.0–15.0)
Immature Granulocytes: 0 %
Lymphocytes Relative: 35 %
Lymphs Abs: 1.1 10*3/uL (ref 0.7–4.0)
MCH: 28.5 pg (ref 26.0–34.0)
MCHC: 32.6 g/dL (ref 30.0–36.0)
MCV: 87.3 fL (ref 80.0–100.0)
Monocytes Absolute: 0.5 10*3/uL (ref 0.1–1.0)
Monocytes Relative: 16 %
Neutro Abs: 1.4 10*3/uL — ABNORMAL LOW (ref 1.7–7.7)
Neutrophils Relative %: 43 %
Platelets: 136 10*3/uL — ABNORMAL LOW (ref 150–400)
RBC: 3.3 MIL/uL — ABNORMAL LOW (ref 3.87–5.11)
RDW: 15.4 % (ref 11.5–15.5)
WBC: 3.1 10*3/uL — ABNORMAL LOW (ref 4.0–10.5)
nRBC: 0 % (ref 0.0–0.2)

## 2020-11-20 LAB — TROPONIN I (HIGH SENSITIVITY)
Troponin I (High Sensitivity): 90 ng/L — ABNORMAL HIGH (ref <18)
Troponin I (High Sensitivity): 101 ng/L (ref <18)

## 2020-11-20 LAB — COMPREHENSIVE METABOLIC PANEL
ALT: 12 U/L (ref 0–44)
AST: 27 U/L (ref 15–41)
Albumin: 3.3 g/dL — ABNORMAL LOW (ref 3.5–5.0)
Alkaline Phosphatase: 76 U/L (ref 38–126)
Anion gap: 9 (ref 5–15)
BUN: 50 mg/dL — ABNORMAL HIGH (ref 8–23)
CO2: 23 mmol/L (ref 22–32)
Calcium: 8.7 mg/dL — ABNORMAL LOW (ref 8.9–10.3)
Chloride: 100 mmol/L (ref 98–111)
Creatinine, Ser: 1.74 mg/dL — ABNORMAL HIGH (ref 0.44–1.00)
GFR, Estimated: 27 mL/min — ABNORMAL LOW (ref >60)
Glucose, Bld: 99 mg/dL (ref 70–99)
Potassium: 3.5 mmol/L (ref 3.5–5.1)
Sodium: 132 mmol/L — ABNORMAL LOW (ref 135–145)
Total Bilirubin: 0.7 mg/dL (ref 0.3–1.2)
Total Protein: 7.6 g/dL (ref 6.5–8.1)

## 2020-11-20 LAB — RESP PANEL BY RT-PCR (FLU A&B, COVID) ARPGX2
Influenza A by PCR: NEGATIVE
Influenza B by PCR: NEGATIVE
SARS Coronavirus 2 by RT PCR: NEGATIVE

## 2020-11-20 LAB — BRAIN NATRIURETIC PEPTIDE: B Natriuretic Peptide: >4500 pg/mL — ABNORMAL HIGH (ref 0.0–100.0)

## 2020-11-20 MED ORDER — FUROSEMIDE 10 MG/ML IJ SOLN
60.0000 mg | Freq: Once | INTRAMUSCULAR | Status: AC
  Administered 2020-11-20: 60 mg via INTRAVENOUS
  Filled 2020-11-20: qty 6

## 2020-11-20 NOTE — ED Notes (Signed)
Troponin 101. Notified by lab. Notified MD of lab value.

## 2020-11-20 NOTE — ED Notes (Signed)
Julia Hart, niece is going home. She asks to be called if needed for updates. She will return to visit tomorrow.

## 2020-11-20 NOTE — ED Provider Notes (Signed)
Emergency Department Provider Note   I have reviewed the triage vital signs and the nursing notes.   HISTORY  Chief Complaint Shortness of Breath   HPI Julia Hart is a 87 y.o. female with past medical history of CAD, hypertension, hyperlipidemia, CHF on torsemide presents to the emergency department with bilateral leg swelling and shortness of breath.   She denies any associated chest pain or fevers.  She notes symptoms worsening over the past couple of days.  She has been taking her home medications as prescribed.  She feels worse with lying flat had difficulty sleeping last night.  She feels like she is not urinating as much as he once did on her torsemide. No radiation of symptoms or modifying factors.   Past Medical History:  Diagnosis Date  . CAD (coronary artery disease)   . HTN (hypertension)   . Hyperlipidemia     Patient Active Problem List   Diagnosis Date Noted  . Elevated troponin 11/21/2020  . AKI (acute kidney injury) ([place]) 11/21/2020  . Hyponatremia 11/21/2020  . Mild protein-calorie malnutrition ([place]) 11/21/2020  . Diabetes mellitus type 2 in nonobese ([place]) 11/21/2020  . CHF (congestive heart failure) ([place]) 11/20/2020  . Acute exacerbation of CHF (congestive heart failure) ([place]) 09/15/2020  . Acute on chronic systolic CHF (congestive heart failure) ([place]) 08/04/2020  . Decompensated heart failure ([place]) 08/03/2020  . Coronary artery disease 08/03/2020  . HTN (hypertension) 08/03/2020  . COVID-19 virus infection 08/03/2020  . Atrial fibrillation, chronic ([place]) 08/03/2020  . GERD (gastroesophageal reflux disease) 07/20/2020  . Melena 06/29/2020  . Abdominal pain 06/29/2020  . Loss of weight   . Anemia 12/19/2016  . Change in bowel habits 12/19/2016    Past Surgical History:  Procedure Laterality Date  . ABDOMINAL HYSTERECTOMY    . BIOPSY  01/13/2017   Procedure: BIOPSY;  Surgeon: Danie Binder, MD;  [facility];  Service: Endoscopy;;   duodenal gastric   . COLONOSCOPY N/A 01/13/2017   Procedure: COLONOSCOPY;  Surgeon: Danie Binder, MD;  Location: [facility];  Service: Endoscopy;  Laterality: N/A;  3:00pm-moved to 1:45pm per [place]  . CORONARY ANGIOPLASTY WITH STENT PLACEMENT  2018  . CORONARY ARTERY BYPASS GRAFT  2008  . ESOPHAGOGASTRODUODENOSCOPY N/A 01/13/2017   Procedure: ESOPHAGOGASTRODUODENOSCOPY (EGD);  Surgeon: Danie Binder, MD;  Location: [facility];  Service: Endoscopy;  Laterality: N/A;  . ESOPHAGOGASTRODUODENOSCOPY (EGD) WITH PROPOFOL N/A 06/29/2020   Procedure: ESOPHAGOGASTRODUODENOSCOPY (EGD) WITH PROPOFOL;  Surgeon: Daneil Dolin, MD;  [facility]: [facility];  Service: Endoscopy;  Laterality: N/A;  1:45pm  . GIVENS CAPSULE STUDY N/A 09/05/2020   Procedure: GIVENS CAPSULE STUDY;  Surgeon: Eloise Harman, DO;  Location: [facility];  Service: Endoscopy;  Laterality: N/A;  7:30am    Allergies Patient has no known allergies.  Family History  Problem Relation Age of Onset  . Colon cancer Neg Hx   . Colon polyps Neg Hx     Social History Social History   Tobacco Use  . Smoking status: Former Research scientist (life sciences)  . Smokeless tobacco: Never Used  Vaping Use  . Vaping Use: Never used  Substance Use Topics  . Alcohol use: No  . Drug use: No    Review of Systems  Constitutional: No fever/chills Eyes: No visual changes. ENT: No sore throat. Cardiovascular: Denies chest pain. Respiratory: Positive shortness of breath. Gastrointestinal: No abdominal pain.  No nausea, no vomiting.  No diarrhea.  No constipation. Genitourinary:  Negative for dysuria. Musculoskeletal: Negative for back pain. Skin: Negative for rash.  Neurological: Negative for headaches, focal weakness or numbness.  10-point ROS otherwise negative.  ____________________________________________   PHYSICAL EXAM:  VITAL SIGNS: ED Triage Vitals  Enc Vitals Group     BP 11/20/20 1733 (!) 145/50     Pulse Rate 11/20/20 1733 (!)  50     Resp 11/20/20 1733 (!) 24     Temp 11/20/20 1733 98.2 F (36.8 C)     Temp Source 11/20/20 1733 Oral     SpO2 11/20/20 1733 96 %   Constitutional: Alert and oriented. Well appearing and in no acute distress. Eyes: Conjunctivae are normal.  Head: Atraumatic. Nose: No congestion/rhinnorhea. Mouth/Throat: Mucous membranes are moist. Neck: No stridor.   Cardiovascular: Normal rate, regular rhythm. Good peripheral circulation. Grossly normal heart sounds. JVP to the angle of the mandible.  Respiratory: Increased respiratory effort.  No retractions. Lungs with crackles at the bases.  Gastrointestinal: Soft and nontender. Mild distention.  Musculoskeletal: No lower extremity tenderness with 3+ pitting edema bilaterally. No gross deformities of extremities. Neurologic:  Normal speech and language. No gross focal neurologic deficits are appreciated.  Skin:  Skin is warm, dry and intact. No rash noted.  ____________________________________________   LABS (all labs ordered are listed, but only abnormal results are displayed)  Labs Reviewed  COMPREHENSIVE METABOLIC PANEL - Abnormal; Notable for the following components:      Result Value   Sodium 132 (*)    BUN 50 (*)    Creatinine, Ser 1.74 (*)    Calcium 8.7 (*)    Albumin 3.3 (*)    GFR, Estimated 27 (*)    All other components within normal limits  BRAIN NATRIURETIC PEPTIDE - Abnormal; Notable for the following components:   B Natriuretic Peptide >4,500.0 (*)    All other components within normal limits  CBC WITH DIFFERENTIAL/PLATELET - Abnormal; Notable for the following components:   WBC 3.1 (*)    RBC 3.30 (*)    Hemoglobin 9.4 (*)    HCT 28.8 (*)    Platelets 136 (*)    Neutro Abs 1.4 (*)    All other components within normal limits  COMPREHENSIVE METABOLIC PANEL - Abnormal; Notable for the following components:   Sodium 132 (*)    Potassium 3.1 (*)    Glucose, Bld 112 (*)    BUN 49 (*)    Creatinine, Ser 1.68  (*)    Calcium 8.7 (*)    Albumin 3.4 (*)    GFR, Estimated 28 (*)    All other components within normal limits  CBC WITH DIFFERENTIAL/PLATELET - Abnormal; Notable for the following components:   WBC 3.3 (*)    RBC 3.38 (*)    Hemoglobin 9.7 (*)    HCT 29.8 (*)    Platelets 128 (*)    Neutro Abs 1.5 (*)    All other components within normal limits  GLUCOSE, CAPILLARY - Abnormal; Notable for the following components:   Glucose-Capillary 116 (*)    All other components within normal limits  TROPONIN I (HIGH SENSITIVITY) - Abnormal; Notable for the following components:   Troponin I (High Sensitivity) 90 (*)    All other components within normal limits  TROPONIN I (HIGH SENSITIVITY) - Abnormal; Notable for the following components:   Troponin I (High Sensitivity) 101 (*)    All other components within normal limits  TROPONIN I (HIGH SENSITIVITY) - Abnormal; Notable for the following  components:   Troponin I (High Sensitivity) 93 (*)    All other components within normal limits  RESP PANEL BY RT-PCR (FLU A&B, COVID) ARPGX2  MAGNESIUM  GLUCOSE, CAPILLARY  URINALYSIS, ROUTINE W REFLEX MICROSCOPIC  HEMOGLOBIN A1C   ____________________________________________  EKG  None ____________________________________________  RADIOLOGY  DG Chest Portable 1 View  Result Date: 11/20/2020 CLINICAL DATA:  Dyspnea, COVID infection a few months prior EXAM: PORTABLE CHEST 1 VIEW COMPARISON:  09/14/2020 chest radiograph. FINDINGS: Stable configuration of median sternotomy wires with chronic lower most sternotomy wire discontinuity. Stable cardiomediastinal silhouette with moderate cardiomegaly. No pneumothorax. No pleural effusion. Cephalization of the pulmonary vasculature without overt pulmonary edema. No acute consolidative airspace disease. IMPRESSION: Moderate cardiomegaly without overt pulmonary edema. No active pulmonary disease. Electronically Signed   By: Ilona Sorrel M.D.   On: 11/20/2020  19:01    ____________________________________________   PROCEDURES  Procedure(s) performed:   Procedures  CRITICAL CARE Performed by: Margette Fast Total critical care time: 35 minutes Critical care time was exclusive of separately billable procedures and treating other patients. Critical care was necessary to treat or prevent imminent or life-threatening deterioration. Critical care was time spent personally by me on the following activities: development of treatment plan with patient and/or surrogate as well as nursing, discussions with consultants, evaluation of patient's response to treatment, examination of patient, obtaining history from patient or surrogate, ordering and performing treatments and interventions, ordering and review of laboratory studies, ordering and review of radiographic studies, pulse oximetry and re-evaluation of patient's condition.  Nanda Quinton, MD Emergency Medicine  ____________________________________________   INITIAL IMPRESSION / ASSESSMENT AND PLAN / ED COURSE  Pertinent labs & imaging results that were available during my care of the patient were reviewed by me and considered in my medical decision making (see chart for details).   Patient presents to the emergency department with increased shortness of breath and PND with significant swelling on exam along with rales and JVP to be angle of the mandible.  Clinically seems volume overloaded despite being compliant with her torsemide.  No hypoxemia but is mildly tachypneic.  Plan for chest x-ray along with COVID swab, labs including BNP.  BNP elevated. CXR clear but clinically patient is volume overloaded. Plan for lasix and admit.   Discussed patient's case with [facility] to request admission. Patient and family (if present) updated with plan. Care transferred to [facility] [place] service.  I reviewed all nursing notes, vitals, pertinent old records, EKGs, labs, imaging (as  available).  ____________________________________________  FINAL CLINICAL IMPRESSION(S) / ED DIAGNOSES  Final diagnoses:  SOB (shortness of breath)  Acute congestive heart failure, unspecified heart failure type ([place])     MEDICATIONS GIVEN DURING THIS VISIT:  Medications  aspirin EC tablet 81 mg (81 mg Oral Given 11/21/20 0914)  amLODipine (NORVASC) tablet 5 mg (5 mg Oral Not Given 11/21/20 0919)  atorvastatin (LIPITOR) tablet 20 mg (20 mg Oral Given 11/21/20 0136)  sacubitril-valsartan (ENTRESTO) 97-103 mg per tablet (0.5 tablets Oral Given 11/21/20 0136)  hydrALAZINE (APRESOLINE) tablet 50 mg (50 mg Oral Not Given 11/21/20 0920)  isosorbide dinitrate (ISORDIL) tablet 5 mg (5 mg Oral Given 11/21/20 0136)  metoprolol succinate (TOPROL-XL) 24 hr tablet 25 mg (25 mg Oral Given 11/21/20 0914)  furosemide (LASIX) injection 60 mg (has no administration in time range)  polyethylene glycol (MIRALAX / GLYCOLAX) packet 17 g (has no administration in time range)  apixaban (ELIQUIS) tablet 2.5 mg (2.5 mg Oral Given 11/21/20 0914)  feeding  supplement (ENSURE ENLIVE / ENSURE PLUS) liquid 237 mL (237 mLs Oral Given 11/21/20 0928)  acetaminophen (TYLENOL) tablet 650 mg (has no administration in time range)    Or  acetaminophen (TYLENOL) suppository 650 mg (has no administration in time range)  oxyCODONE (Oxy IR/ROXICODONE) immediate release tablet 5 mg (has no administration in time range)  ondansetron (ZOFRAN) tablet 4 mg (has no administration in time range)    Or  ondansetron (ZOFRAN) injection 4 mg (has no administration in time range)  insulin aspart (novoLOG) injection 0-15 Units (0 Units Subcutaneous Not Given 11/21/20 0920)  insulin aspart (novoLOG) injection 0-5 Units (0 Units Subcutaneous Not Given 11/21/20 0109)  magnesium sulfate IVPB 2 g 50 mL (has no administration in time range)  albumin human 25 % solution 25 g (25 g Intravenous New Bag/Given 11/21/20 0944)  Living Better with Heart Failure Book (has  no administration in time range)  furosemide (LASIX) injection 60 mg (60 mg Intravenous Given 11/20/20 2032)  potassium chloride SA (KLOR-CON) CR tablet 40 mEq (40 mEq Oral Given 11/21/20 0914)    Note:  This document was prepared using Dragon voice recognition software and may include unintentional dictation errors.  Nanda Quinton, MD, [facility] Emergency Medicine    Othelia Riederer, [facility], MD 11/21/20 [phone]

## 2020-11-20 NOTE — ED Triage Notes (Signed)
Shortness of breath, increased work of breathing

## 2020-11-20 NOTE — ED Notes (Signed)
Pt reports she has CHF, has noticed fluid retention over past several days. Bilateral LE edema noted. Last night started to feel short of breath, worse today with exertion and at rest. Currently diminished breath sounds with rales bilaterally. Pt speaking in full sentences. 97% on RA. A&Ox3 but poor historian. States niece helps care for her, will try and locate to bring back to room. Pt states she is compliant with her medications which include a diuretic. No recent fevers or other illness.

## 2020-11-21 DIAGNOSIS — E785 Hyperlipidemia, unspecified: Secondary | ICD-10-CM | POA: Diagnosis present

## 2020-11-21 DIAGNOSIS — I5043 Acute on chronic combined systolic (congestive) and diastolic (congestive) heart failure: Secondary | ICD-10-CM

## 2020-11-21 DIAGNOSIS — E1122 Type 2 diabetes mellitus with diabetic chronic kidney disease: Secondary | ICD-10-CM | POA: Diagnosis present

## 2020-11-21 DIAGNOSIS — E871 Hypo-osmolality and hyponatremia: Secondary | ICD-10-CM | POA: Diagnosis present

## 2020-11-21 DIAGNOSIS — R7989 Other specified abnormal findings of blood chemistry: Secondary | ICD-10-CM | POA: Diagnosis present

## 2020-11-21 DIAGNOSIS — I248 Other forms of acute ischemic heart disease: Secondary | ICD-10-CM | POA: Diagnosis present

## 2020-11-21 DIAGNOSIS — R0609 Other forms of dyspnea: Secondary | ICD-10-CM | POA: Diagnosis not present

## 2020-11-21 DIAGNOSIS — E119 Type 2 diabetes mellitus without complications: Secondary | ICD-10-CM | POA: Diagnosis not present

## 2020-11-21 DIAGNOSIS — I4819 Other persistent atrial fibrillation: Secondary | ICD-10-CM | POA: Diagnosis present

## 2020-11-21 DIAGNOSIS — I252 Old myocardial infarction: Secondary | ICD-10-CM | POA: Diagnosis not present

## 2020-11-21 DIAGNOSIS — I493 Ventricular premature depolarization: Secondary | ICD-10-CM | POA: Diagnosis present

## 2020-11-21 DIAGNOSIS — K219 Gastro-esophageal reflux disease without esophagitis: Secondary | ICD-10-CM | POA: Diagnosis present

## 2020-11-21 DIAGNOSIS — E441 Mild protein-calorie malnutrition: Secondary | ICD-10-CM | POA: Diagnosis present

## 2020-11-21 DIAGNOSIS — N1832 Chronic kidney disease, stage 3b: Secondary | ICD-10-CM | POA: Diagnosis not present

## 2020-11-21 DIAGNOSIS — K5900 Constipation, unspecified: Secondary | ICD-10-CM | POA: Diagnosis present

## 2020-11-21 DIAGNOSIS — R778 Other specified abnormalities of plasma proteins: Secondary | ICD-10-CM | POA: Diagnosis not present

## 2020-11-21 DIAGNOSIS — R0602 Shortness of breath: Secondary | ICD-10-CM | POA: Diagnosis not present

## 2020-11-21 DIAGNOSIS — Z79899 Other long term (current) drug therapy: Secondary | ICD-10-CM | POA: Diagnosis not present

## 2020-11-21 DIAGNOSIS — I255 Ischemic cardiomyopathy: Secondary | ICD-10-CM | POA: Diagnosis present

## 2020-11-21 DIAGNOSIS — I5033 Acute on chronic diastolic (congestive) heart failure: Secondary | ICD-10-CM | POA: Diagnosis not present

## 2020-11-21 DIAGNOSIS — Z8616 Personal history of COVID-19: Secondary | ICD-10-CM | POA: Diagnosis not present

## 2020-11-21 DIAGNOSIS — I083 Combined rheumatic disorders of mitral, aortic and tricuspid valves: Secondary | ICD-10-CM | POA: Diagnosis present

## 2020-11-21 DIAGNOSIS — I130 Hypertensive heart and chronic kidney disease with heart failure and stage 1 through stage 4 chronic kidney disease, or unspecified chronic kidney disease: Secondary | ICD-10-CM | POA: Diagnosis present

## 2020-11-21 DIAGNOSIS — I313 Pericardial effusion (noninflammatory): Secondary | ICD-10-CM | POA: Diagnosis present

## 2020-11-21 DIAGNOSIS — I10 Essential (primary) hypertension: Secondary | ICD-10-CM

## 2020-11-21 DIAGNOSIS — I4891 Unspecified atrial fibrillation: Secondary | ICD-10-CM

## 2020-11-21 DIAGNOSIS — Z66 Do not resuscitate: Secondary | ICD-10-CM | POA: Diagnosis present

## 2020-11-21 DIAGNOSIS — I5082 Biventricular heart failure: Secondary | ICD-10-CM | POA: Diagnosis present

## 2020-11-21 DIAGNOSIS — R188 Other ascites: Secondary | ICD-10-CM | POA: Diagnosis present

## 2020-11-21 DIAGNOSIS — I2510 Atherosclerotic heart disease of native coronary artery without angina pectoris: Secondary | ICD-10-CM | POA: Diagnosis present

## 2020-11-21 DIAGNOSIS — I509 Heart failure, unspecified: Secondary | ICD-10-CM | POA: Diagnosis present

## 2020-11-21 DIAGNOSIS — Z87891 Personal history of nicotine dependence: Secondary | ICD-10-CM | POA: Diagnosis not present

## 2020-11-21 DIAGNOSIS — N179 Acute kidney failure, unspecified: Secondary | ICD-10-CM

## 2020-11-21 DIAGNOSIS — Z20822 Contact with and (suspected) exposure to covid-19: Secondary | ICD-10-CM | POA: Diagnosis present

## 2020-11-21 LAB — COMPREHENSIVE METABOLIC PANEL
ALT: 10 U/L (ref 0–44)
AST: 24 U/L (ref 15–41)
Albumin: 3.4 g/dL — ABNORMAL LOW (ref 3.5–5.0)
Alkaline Phosphatase: 73 U/L (ref 38–126)
Anion gap: 10 (ref 5–15)
BUN: 49 mg/dL — ABNORMAL HIGH (ref 8–23)
CO2: 23 mmol/L (ref 22–32)
Calcium: 8.7 mg/dL — ABNORMAL LOW (ref 8.9–10.3)
Chloride: 99 mmol/L (ref 98–111)
Creatinine, Ser: 1.68 mg/dL — ABNORMAL HIGH (ref 0.44–1.00)
GFR, Estimated: 28 mL/min — ABNORMAL LOW (ref >60)
Glucose, Bld: 112 mg/dL — ABNORMAL HIGH (ref 70–99)
Potassium: 3.1 mmol/L — ABNORMAL LOW (ref 3.5–5.1)
Sodium: 132 mmol/L — ABNORMAL LOW (ref 135–145)
Total Bilirubin: 0.9 mg/dL (ref 0.3–1.2)
Total Protein: 7.5 g/dL (ref 6.5–8.1)

## 2020-11-21 LAB — CBC WITH DIFFERENTIAL/PLATELET
Abs Immature Granulocytes: 0.01 10*3/uL (ref 0.00–0.07)
Basophils Absolute: 0 10*3/uL (ref 0.0–0.1)
Basophils Relative: 1 %
Eosinophils Absolute: 0.1 10*3/uL (ref 0.0–0.5)
Eosinophils Relative: 4 %
HCT: 29.8 % — ABNORMAL LOW (ref 36.0–46.0)
Hemoglobin: 9.7 g/dL — ABNORMAL LOW (ref 12.0–15.0)
Immature Granulocytes: 0 %
Lymphocytes Relative: 34 %
Lymphs Abs: 1.1 10*3/uL (ref 0.7–4.0)
MCH: 28.7 pg (ref 26.0–34.0)
MCHC: 32.6 g/dL (ref 30.0–36.0)
MCV: 88.2 fL (ref 80.0–100.0)
Monocytes Absolute: 0.5 10*3/uL (ref 0.1–1.0)
Monocytes Relative: 15 %
Neutro Abs: 1.5 10*3/uL — ABNORMAL LOW (ref 1.7–7.7)
Neutrophils Relative %: 46 %
Platelets: 128 10*3/uL — ABNORMAL LOW (ref 150–400)
RBC: 3.38 MIL/uL — ABNORMAL LOW (ref 3.87–5.11)
RDW: 15.5 % (ref 11.5–15.5)
WBC: 3.3 10*3/uL — ABNORMAL LOW (ref 4.0–10.5)
nRBC: 0 % (ref 0.0–0.2)

## 2020-11-21 LAB — HEMOGLOBIN A1C
Hgb A1c MFr Bld: 5.8 % — ABNORMAL HIGH (ref 4.8–5.6)
Mean Plasma Glucose: 119.76 mg/dL

## 2020-11-21 LAB — BASIC METABOLIC PANEL
Anion gap: 8 (ref 5–15)
BUN: 51 mg/dL — ABNORMAL HIGH (ref 8–23)
CO2: 26 mmol/L (ref 22–32)
Calcium: 8.8 mg/dL — ABNORMAL LOW (ref 8.9–10.3)
Chloride: 99 mmol/L (ref 98–111)
Creatinine, Ser: 1.78 mg/dL — ABNORMAL HIGH (ref 0.44–1.00)
GFR, Estimated: 26 mL/min — ABNORMAL LOW (ref >60)
Glucose, Bld: 100 mg/dL — ABNORMAL HIGH (ref 70–99)
Potassium: 4 mmol/L (ref 3.5–5.1)
Sodium: 133 mmol/L — ABNORMAL LOW (ref 135–145)

## 2020-11-21 LAB — GLUCOSE, CAPILLARY
Glucose-Capillary: 89 mg/dL (ref 70–99)
Glucose-Capillary: 116 mg/dL — ABNORMAL HIGH (ref 70–99)
Glucose-Capillary: 154 mg/dL — ABNORMAL HIGH (ref 70–99)
Glucose-Capillary: 105 mg/dL — ABNORMAL HIGH (ref 70–99)
Glucose-Capillary: 127 mg/dL — ABNORMAL HIGH (ref 70–99)

## 2020-11-21 LAB — TROPONIN I (HIGH SENSITIVITY): Troponin I (High Sensitivity): 93 ng/L — ABNORMAL HIGH (ref <18)

## 2020-11-21 LAB — MAGNESIUM: Magnesium: 1.8 mg/dL (ref 1.7–2.4)

## 2020-11-21 MED ORDER — ONDANSETRON HCL 4 MG PO TABS: 4.0000 mg | ORAL_TABLET | Freq: Four times a day (QID) | ORAL | Status: DC | PRN

## 2020-11-21 MED ORDER — APIXABAN 2.5 MG PO TABS
2.5000 mg | ORAL_TABLET | Freq: Two times a day (BID) | ORAL | Status: DC
  Administered 2020-11-21 – 2020-11-29 (×18): 2.5 mg via ORAL
  Filled 2020-11-21 (×18): qty 1

## 2020-11-21 MED ORDER — ALBUMIN HUMAN 25 % IV SOLN
25.0000 g | Freq: Four times a day (QID) | INTRAVENOUS | Status: AC
  Administered 2020-11-21 (×2): 25 g via INTRAVENOUS
  Filled 2020-11-21 (×2): qty 100

## 2020-11-21 MED ORDER — AMLODIPINE BESYLATE 5 MG PO TABS
5.0000 mg | ORAL_TABLET | Freq: Every day | ORAL | Status: DC
  Administered 2020-11-22 – 2020-11-26 (×5): 5 mg via ORAL
  Filled 2020-11-21 (×7): qty 1

## 2020-11-21 MED ORDER — LIVING BETTER WITH HEART FAILURE BOOK: Freq: Once | Status: AC

## 2020-11-21 MED ORDER — SACUBITRIL-VALSARTAN 97-103 MG PO TABS
0.5000 | ORAL_TABLET | Freq: Two times a day (BID) | ORAL | Status: DC
  Administered 2020-11-21 – 2020-11-29 (×17): 0.5 via ORAL
  Filled 2020-11-21 (×23): qty 0.5

## 2020-11-21 MED ORDER — MAGNESIUM SULFATE 2 GM/50ML IV SOLN
2.0000 g | Freq: Once | INTRAVENOUS | Status: AC
  Administered 2020-11-21: 2 g via INTRAVENOUS
  Filled 2020-11-21: qty 50

## 2020-11-21 MED ORDER — ONDANSETRON HCL 4 MG/2ML IJ SOLN
4.0000 mg | Freq: Four times a day (QID) | INTRAMUSCULAR | Status: DC | PRN
  Administered 2020-11-22: 4 mg via INTRAVENOUS
  Filled 2020-11-21: qty 2

## 2020-11-21 MED ORDER — FUROSEMIDE 10 MG/ML IJ SOLN
60.0000 mg | Freq: Two times a day (BID) | INTRAMUSCULAR | Status: DC
  Administered 2020-11-21 – 2020-11-22 (×3): 60 mg via INTRAVENOUS
  Filled 2020-11-21 (×3): qty 6

## 2020-11-21 MED ORDER — ASPIRIN EC 81 MG PO TBEC
81.0000 mg | DELAYED_RELEASE_TABLET | Freq: Every day | ORAL | Status: DC
  Administered 2020-11-21 – 2020-11-29 (×9): 81 mg via ORAL
  Filled 2020-11-21 (×9): qty 1

## 2020-11-21 MED ORDER — METOPROLOL SUCCINATE ER 25 MG PO TB24
25.0000 mg | ORAL_TABLET | Freq: Every day | ORAL | Status: DC
  Administered 2020-11-21 – 2020-11-29 (×9): 25 mg via ORAL
  Filled 2020-11-21 (×9): qty 1

## 2020-11-21 MED ORDER — HYDRALAZINE HCL 25 MG PO TABS
50.0000 mg | ORAL_TABLET | Freq: Two times a day (BID) | ORAL | Status: DC
  Administered 2020-11-21 – 2020-11-26 (×11): 50 mg via ORAL
  Filled 2020-11-21 (×14): qty 2

## 2020-11-21 MED ORDER — ATORVASTATIN CALCIUM 20 MG PO TABS
20.0000 mg | ORAL_TABLET | Freq: Every day | ORAL | Status: DC
  Administered 2020-11-21 – 2020-11-28 (×9): 20 mg via ORAL
  Filled 2020-11-21 (×9): qty 1

## 2020-11-21 MED ORDER — OXYCODONE HCL 5 MG PO TABS
5.0000 mg | ORAL_TABLET | ORAL | Status: DC | PRN
  Administered 2020-11-23 – 2020-11-26 (×2): 5 mg via ORAL
  Filled 2020-11-21 (×2): qty 1

## 2020-11-21 MED ORDER — POLYETHYLENE GLYCOL 3350 17 G PO PACK
17.0000 g | PACK | Freq: Every day | ORAL | Status: DC | PRN
  Administered 2020-11-24: 17 g via ORAL
  Filled 2020-11-21: qty 1

## 2020-11-21 MED ORDER — INSULIN ASPART 100 UNIT/ML IJ SOLN
0.0000 [IU] | Freq: Three times a day (TID) | INTRAMUSCULAR | Status: DC
  Administered 2020-11-21: 3 [IU] via SUBCUTANEOUS
  Administered 2020-11-22 – 2020-11-28 (×7): 2 [IU] via SUBCUTANEOUS

## 2020-11-21 MED ORDER — ACETAMINOPHEN 325 MG PO TABS
650.0000 mg | ORAL_TABLET | Freq: Four times a day (QID) | ORAL | Status: DC | PRN
  Administered 2020-11-24 – 2020-11-28 (×2): 650 mg via ORAL
  Filled 2020-11-21 (×2): qty 2

## 2020-11-21 MED ORDER — ACETAMINOPHEN 650 MG RE SUPP: 650.0000 mg | Freq: Four times a day (QID) | RECTAL | Status: DC | PRN

## 2020-11-21 MED ORDER — ENSURE ENLIVE PO LIQD
237.0000 mL | Freq: Two times a day (BID) | ORAL | Status: DC
  Administered 2020-11-21 – 2020-11-29 (×17): 237 mL via ORAL

## 2020-11-21 MED ORDER — INSULIN ASPART 100 UNIT/ML IJ SOLN: 0.0000 [IU] | Freq: Every day | INTRAMUSCULAR | Status: DC

## 2020-11-21 MED ORDER — ISOSORBIDE DINITRATE 5 MG PO TABS
5.0000 mg | ORAL_TABLET | Freq: Two times a day (BID) | ORAL | Status: DC
  Administered 2020-11-21 – 2020-11-27 (×13): 5 mg via ORAL
  Filled 2020-11-21 (×18): qty 1

## 2020-11-21 MED ORDER — POTASSIUM CHLORIDE CRYS ER 20 MEQ PO TBCR
40.0000 meq | EXTENDED_RELEASE_TABLET | Freq: Once | ORAL | Status: AC
  Administered 2020-11-21: 40 meq via ORAL
  Filled 2020-11-21: qty 2

## 2020-11-21 NOTE — Consult Note (Signed)
Cardiology Consultation:   Patient ID: Julia Hart MRN: [id number]; DOB: September 07, 1926  Admit date: 11/20/2020 Date of Consult: 11/21/2020  PCP:  [name], MD   [facility] Providers Cardiologist:   [facility] cardiologist - Dr Sabra Heck [facility], [place])    Patient Profile:   Julia Hart is a 87 y.o. female with a hx of combined systolic & diastolic HF, ischemic cardiomyopathy (now with recovered EF of 65% per echo on 08/04/20), ? Cardiac amyloidosis, CAD with CABG (2008), paroxysmal VT, paroxysmal atrial fibrillation, HTN, HL, osteoporosis, GERD, anemia and CKD, who is being seen 11/21/2020 for the evaluation of shortness of breath at the request of Shahmehdi, Valeria Batman, MD.  History of Present Illness:   Julia Hart is admitted with bilateral lower extremity edema and weight gain despite the use of diuretics. She has had multiple admissions to the hospital this year with similar complaints. No orthopnea. No chest pain. Felt short of breath with exertion.   In the ED, patient noted to have elevated JVP,+ leg edema bilaterally. BNP elevated at >4500. Hs-Trops flat (90, 101 and 93). CXR clear. O2 sats 97% on room air.  Labs: Na 132, K 3.1, gluc 112, BUN 49, Cr 1.68, WBC 3.3, Hgb 9.7 and Hct 29.8.   Cardiac History 2008 - 6 vessel CABG - LIMA-LAD, SVG-D1, SVG-OM1/OM2 (jump graft), SVG-PDA/RCA (jump graft).  10/17/2015 - TTE showing moderate LVH, left atrial dilation, mild MR, TR, EF 60-65%. Feb 2019 - Admission to [place] with chest palpitations and dypnea on exertion. At that time she was found to be in VT. Placed on amiodarone. Recurrent VT a few days later with profound hypotension. Attempted cardioversion x 3 without success. Also treated with esmolol and procainamide. Levophed for soft BPs. Eventually converted back to sinus with significant bradycardia. 08/24/2017: mild LV dysfunction, EF 50%, severe LVH, mod to severe MR, mod TR 08/27/2017: DES to prox RCA 05/19/2020: NSTEMI [facility], [place]), CHF - tx with IV lasix,  08/04/2020: Echo: EF 65-70%, severe LVH, mild MR, Mod AR, PASP 55 mmHg Jan & Feb 2022 - COVID 19 infection, Multiple hospitalizations for heart failure/volume overload ECG 09/15/2020: sinus rhythm, rate 79 bpm, poor R wave progression and q waves in the inferior leads.    Past Medical History:  Diagnosis Date  . CAD (coronary artery disease)   . HTN (hypertension)   . Hyperlipidemia     Past Surgical History:  Procedure Laterality Date  . ABDOMINAL HYSTERECTOMY    . BIOPSY  01/13/2017   Procedure: BIOPSY;  Surgeon: Danie Binder, MD;  [facility];  Service: Endoscopy;;  duodenal gastric   . COLONOSCOPY N/A 01/13/2017   Procedure: COLONOSCOPY;  Surgeon: Danie Binder, MD;  [facility];  Service: Endoscopy;  Laterality: N/A;  3:00pm-moved to 1:45pm per Ginger  . CORONARY ANGIOPLASTY WITH STENT PLACEMENT  2018  . CORONARY ARTERY BYPASS GRAFT  2008  . ESOPHAGOGASTRODUODENOSCOPY N/A 01/13/2017   Procedure: ESOPHAGOGASTRODUODENOSCOPY (EGD);  Surgeon: Danie Binder, MD;  [facility];  Service: Endoscopy;  Laterality: N/A;  . ESOPHAGOGASTRODUODENOSCOPY (EGD) WITH PROPOFOL N/A 06/29/2020   Procedure: ESOPHAGOGASTRODUODENOSCOPY (EGD) WITH PROPOFOL;  Surgeon: Daneil Dolin, MD;  [facility];  Service: Endoscopy;  Laterality: N/A;  1:45pm  . GIVENS CAPSULE STUDY N/A 09/05/2020   Procedure: GIVENS CAPSULE STUDY;  Surgeon: Eloise Harman, DO;  [facility];  Service: Endoscopy;  Laterality: N/A;  7:30am     Home Medications:  Prior to Admission medications   Medication Sig Start Date End Date Taking? Authorizing Provider  amLODipine (NORVASC) 5 MG tablet Take 5 mg by mouth daily.   Yes [provider]  apixaban (ELIQUIS) 2.5 MG TABS tablet Take 1 tablet (2.5 mg total) by mouth 2 (two) times daily. 09/18/20  Yes Roxan Hockey, MD  aspirin EC 81 MG tablet Take 1 tablet (81 mg total)  by mouth daily with breakfast. 09/18/20 09/18/21 Yes Emokpae, Courage, MD  atorvastatin (LIPITOR) 20 MG tablet Take 20 mg by mouth at bedtime.    Yes [provider]  carvedilol (COREG) 12.5 MG tablet Take 12.5 mg by mouth 2 (two) times daily. 10/20/20  Yes [provider]  empagliflozin (JARDIANCE) 10 MG TABS tablet Take 10 mg by mouth daily.   Yes [provider]  ENTRESTO 97-103 MG Take 0.5 tablets by mouth 2 (two) times daily. 09/19/20  Yes Emokpae, Courage, MD  feeding supplement (ENSURE ENLIVE / ENSURE PLUS) LIQD Take 237 mLs by mouth 2 (two) times daily. 09/18/20  Yes Roxan Hockey, MD  hydrALAZINE (APRESOLINE) 50 MG tablet Take 1 tablet (50 mg total) by mouth 2 (two) times daily. 09/18/20  Yes Emokpae, Courage, MD  isosorbide dinitrate (ISORDIL) 5 MG tablet Take 5 mg by mouth 2 (two) times daily.  04/24/20  Yes [provider]  nitroGLYCERIN (NITROSTAT) 0.4 MG SL tablet Place 0.4 mg under the tongue every 5 (five) minutes as needed for chest pain.   Yes [provider]  pantoprazole (PROTONIX) 40 MG tablet Take 40 mg by mouth daily. 05/17/20  Yes [provider]  polyethylene glycol (MIRALAX / GLYCOLAX) 17 g packet Take 17 g by mouth daily as needed for mild constipation. 09/18/20  Yes Emokpae, Courage, MD  prednisoLONE acetate (PRED FORTE) 1 % ophthalmic suspension Place 1 drop into both eyes every other day. 03/15/20  Yes [provider]  senna-docusate (SENOKOT-S) 8.6-50 MG tablet Take 2 tablets by mouth at bedtime. 09/18/20 09/18/21 Yes Emokpae, Courage, MD  torsemide (DEMADEX) 20 MG tablet Take 1 tablet (20 mg total) by mouth daily. 09/18/20  Yes Roxan Hockey, MD  acetaminophen (TYLENOL) 325 MG tablet Take 2 tablets (650 mg total) by mouth every 6 (six) hours as needed for mild pain, fever or headache (or Fever >/= 101). 09/18/20   Roxan Hockey, MD    Inpatient Medications: Scheduled Meds: . amLODipine  5 mg Oral Daily  .  apixaban  2.5 mg Oral BID  . aspirin EC  81 mg Oral Q breakfast  . atorvastatin  20 mg Oral QHS  . feeding supplement  237 mL Oral BID  . furosemide  60 mg Intravenous BID  . hydrALAZINE  50 mg Oral BID  . insulin aspart  0-15 Units Subcutaneous TID WC  . insulin aspart  0-5 Units Subcutaneous QHS  . isosorbide dinitrate  5 mg Oral BID  . metoprolol succinate  25 mg Oral Daily  . sacubitril-valsartan  0.5 tablet Oral BID   Continuous Infusions: . albumin human 25 g (11/21/20 0944)  . magnesium sulfate bolus IVPB 2 g (11/21/20 1135)   PRN Meds: acetaminophen **OR** acetaminophen, ondansetron **OR** ondansetron (ZOFRAN) IV, oxyCODONE, polyethylene glycol  Allergies:   No Known Allergies  Social History:   Social History   Socioeconomic History  . Marital status: Single    Spouse name: Not on file  . Number of children: Not on file  . Years of education: Not on file  .  Highest education level: Not on file  Occupational History  . Not on file  Tobacco Use  . Smoking status: Former Research scientist (life sciences)  . Smokeless tobacco: Never Used  Vaping Use  . Vaping Use: Never used  Substance and Sexual Activity  . Alcohol use: No  . Drug use: No  . Sexual activity: Not on file  Other Topics Concern  . Not on file  Social History Narrative   QUIT SMOKING LONG TIME AGO. WORKED [place]. NO KIDS. NIECE HELPS CARE FOR HER.   Social Determinants of Health   Financial Resource Strain: Not on file  Food Insecurity: Not on file  Transportation Needs: Not on file  Physical Activity: Not on file  Stress: Not on file  Social Connections: Not on file  Intimate Partner Violence: Not on file    Family History:    Family History  Problem Relation Age of Onset  . Colon cancer Neg Hx   . Colon polyps Neg Hx      ROS:  Please see the history of present illness.  All other ROS reviewed and negative.     Physical Exam/Data:   Vitals:   11/21/20 0042 11/21/20 0430 11/21/20 0914 11/21/20  1203  BP: (!) 157/60 135/75 (!) 122/41 (!) 158/51  Pulse: (!) 51 76 60 61  Resp: 18 18  18   Temp: 97.9 F (36.6 C) 97.9 F (36.6 C)  97.7 F (36.5 C)  TempSrc:  Oral  Oral  SpO2: 100% 100%  99%    Intake/Output Summary (Last 24 hours) at 11/21/2020 1230 Last data filed at 11/21/2020 0900 Gross per 24 hour  Intake 240 ml  Output --  Net 240 ml   Last 3 Weights 09/19/2020 09/18/2020 09/17/2020  Weight (lbs) 131 lb 2.8 oz 131 lb 2.8 oz 133 lb 2.5 oz  Weight (kg) 59.5 kg 59.5 kg 60.4 kg     There is no height or weight on file to calculate BMI.  General:  Poor historian, in no acute distress HEENT: normal Lymph: no adenopathy Neck: mildly elevated VP Endocrine:  No thryomegaly Vascular: No carotid bruits; FA pulses 2+ bilaterally without bruits  Cardiac:  Irregularly irregular Lungs:  clear to auscultation bilaterally, no wheezing, rhonchi or rales  Abd: soft, nontender, no hepatomegaly  Ext: 1+ edema B/L LEs Musculoskeletal:  No deformities, BUE and BLE strength normal and equal Skin: warm and dry  Neuro:  no focal abnormalities noted Psych:  Normal affect    Laboratory Data:  High Sensitivity Troponin:   Recent Labs  Lab 11/20/20 1840 11/20/20 2013 11/21/20 0213  TROPONINIHS 90* 101* 93*     Chemistry Recent Labs  Lab 11/20/20 1840 11/21/20 0213  NA 132* 132*  K 3.5 3.1*  CL 100 99  CO2 23 23  GLUCOSE 99 112*  BUN 50* 49*  CREATININE 1.74* 1.68*  CALCIUM 8.7* 8.7*  GFRNONAA 27* 28*  ANIONGAP 9 10    Recent Labs  Lab 11/20/20 1840 11/21/20 0213  PROT 7.6 7.5  ALBUMIN 3.3* 3.4*  AST 27 24  ALT 12 10  ALKPHOS 76 73  BILITOT 0.7 0.9   Hematology Recent Labs  Lab 11/20/20 1840 11/21/20 0213  WBC 3.1* 3.3*  RBC 3.30* 3.38*  HGB 9.4* 9.7*  HCT 28.8* 29.8*  MCV 87.3 88.2  MCH 28.5 28.7  MCHC 32.6 32.6  RDW 15.4 15.5  PLT 136* 128*   BNP Recent Labs  Lab 11/20/20 1840  BNP >4,500.0*  DDimer No results for input(s): DDIMER in the last  168 hours.   Radiology/Studies:  DG Chest Portable 1 View  Result Date: 11/20/2020 CLINICAL DATA:  Dyspnea, COVID infection a few months prior EXAM: PORTABLE CHEST 1 VIEW COMPARISON:  09/14/2020 chest radiograph. FINDINGS: Stable configuration of median sternotomy wires with chronic lower most sternotomy wire discontinuity. Stable cardiomediastinal silhouette with moderate cardiomegaly. No pneumothorax. No pleural effusion. Cephalization of the pulmonary vasculature without overt pulmonary edema. No acute consolidative airspace disease. IMPRESSION: Moderate cardiomegaly without overt pulmonary edema. No active pulmonary disease. Electronically Signed   By: Ilona Sorrel M.D.   On: 11/20/2020 19:01   CV Studies  ECHO 08/24/17: LEFT VENTRICLE                    Anterior: Normal Size: Normal                 Lateral: Normal Contraction: REGIONALLY IMPAIRED     Septal: Normal Closest EF: 50% (Estimated)           Apical: Normal LV masses: No Masses               Inferior: HYPOCONTRACTILE LVH: SEVERE LVH CONCENTRIC        Posterior: HYPOCONTRACTILE LV GLS(AVG): -10.0% Normal Range [ <= -16] Dias.FxClass: N/A MILD LV DYSFUNCTION WITH SEVERE LVH NORMAL RIGHT VENTRICULAR SYSTOLIC FUNCTION VALVULAR REGURGITATION: MILD AR, MOD. MR, MILD PR, MODERATE TR VALVULAR STENOSIS: TRIVIAL AS MODERATE TO SEVERE MR AND TR. NO PRIOR STUDY FOR COMPARISON  LHC 08/27/17: 1. Dominant RCA with large PDA and PL branches. There is an ostial 80% calcified stenosis and mid 30% stenosis. There is an occluded SVG jump graft from the   aorta to PDA-PL-OM-2-OM1-and first diagonal. The initial segment from the aorta to PDA is occluded but the remainder of the graft from the PDA to first diagonal   is patent.  2. 30% proximal LAD narrowing and no other disease distally. There is an atretic LIMA to the mid-distal LAD. The first diagonal has an  ostial 90% stenosis with   patent jump graft from the OM-1 3. The first OM has a long, tubular 70% stenosis with patent jump graft from OM-2. 4. The second OM has no significant disease.  5. There is a 90% mid circumflex stenosis that leads to a small third marginal. 6. Successful PCI of the ostial and proximal RCA initially with a cutting balloon followed by deployment of a 4.0x83mm Onyx DES at the ostium and a 4.0x64mm in   the mid proximal segment to cover a ulcerated plaque both with excellent result.   Echocardiogram 08/04/2020 1. Left ventricular ejection fraction, by estimation, is 65 to 70%. The left ventricle has normal function. The left ventricle has no regional wall motion abnormalities. There is severe left ventricular hypertrophy. Left ventricular diastolic parameters are consistent with Grade II diastolic dysfunction (pseudonormalization). Elevated left atrial pressure. 2. Right ventricular systolic function is mildly reduced. The right ventricular size is moderately enlarged. 3. Left atrial size was severely dilated. 4. Right atrial size was moderately dilated. 5. A small pericardial effusion is present. The pericardial effusion is circumferential. 6. The mitral valve is abnormal. Mild mitral valve regurgitation. No evidence of mitral stenosis. Moderate mitral annular calcification. 7. Tricuspid valve regurgitation is moderate. 8. The aortic valve is tricuspid. There is severe calcifcation of the aortic valve. There is severe thickening of the aortic valve. Aortic valve regurgitation is moderate. Moderate aortic valve stenosis. 9.  Moderate pulmonary HTN, PASP is 55 mmHg. 10. The inferior vena cava is dilated in size with <50% respiratory variability, suggesting right atrial pressure of 15 mmHg.     Assessment and Plan:   1. Congestive heart failure (acute on chronic combined systolic and diastolic HF)  - Daily weights - Strict I and Os - Reasonable to diurese with  IV lasix - Maintain serum K >4.0 and Mg >2.0 - Continue Entresto - Continue hydralazine and isordil  Would be careful with metoprolol. The patient appears to have a low resting heart rate. Might be at risk for developing significant bradycardia at higher doses. Consider palliative care consult to discuss long-term care goals   2. Coronary artery disease with CABG in 2008 and PCI of RCA 2019 Flat troponins, Patient denies any chest pain  - Does not need aspirin - Clopidogrel 75 mg daily - Continue Atorvastatin   3. Atrial fibrillation She is rate controlled  - Continue Eliquis  4. Hypertension  -Continue amlodipine, hydralazine, Isordil,   For questions or updates, please contact [place] Please consult [email or web address] for contact info under    Signed, Meade Maw, MD  11/21/2020 12:30 PM

## 2020-11-21 NOTE — Evaluation (Addendum)
Physical Therapy Evaluation Patient Details Name: Julia Hart MRN: [id number] DOB: 04-04-1927 Today's Date: 11/21/2020   History of Present Illness  Julia Hart is a 87 y.o. female presents with c/o dyspnea, BLE swelling, orthopnea. PMH: HTN, CAD, CHF, CABG 2008    Clinical Impression  Pt admitted with above diagnosis. Pt states has been to rehab and is continuing to get stronger, but relying on niece and nephew more lately as she recovers. Pt recently using SPC due to fatigue, denies falls. Pt currently ambulating 100 ft with RW and SUPV, able to take steps in room without but fatigues easily so provided with RW. Pt with mild SOB while seated EOB, on RA with SpO2 98%, continued mild SOB with ambulation. Pt has RW at home, states her nephew stays with her at night and her niece or friend can come over quickly if she calls them. Pt currently with functional limitations due to the deficits listed below (see PT Problem List). Pt will benefit from skilled PT to increase their independence and safety with mobility to allow discharge to the venue listed below.       Follow Up Recommendations Home health PT;Supervision - Intermittent    Equipment Recommendations  None recommended by PT    Recommendations for Other Services       Precautions / Restrictions Precautions Precautions: Fall Restrictions Weight Bearing Restrictions: No      Mobility  Bed Mobility Overal bed mobility: Modified Independent  General bed mobility comments: slightly increased time    Transfers Overall transfer level: Modified independent Equipment used: None  General transfer comment: powers to stand with BUE assisting, good steadiness upon rising, trunk slightly flexed forward  Ambulation/Gait Ambulation/Gait assistance: Supervision Gait Distance (Feet): 100 Feet Assistive device: Rolling walker (2 wheeled) Gait Pattern/deviations: Step-through pattern;Decreased stride length Gait velocity: slightly  decreased   General Gait Details: pt ambulates with step through pattern, mild SOB noted, trunk slightly flexed forward, no LOB  Stairs            Wheelchair Mobility    Modified Rankin (Stroke Patients Only)       Balance Overall balance assessment: Needs assistance   Sitting balance-Leahy Scale: Good Sitting balance - Comments: seated EOB   Standing balance support: During functional activity Standing balance-Leahy Scale: Fair        Pertinent Vitals/Pain Pain Assessment: No/denies pain    Home Living Family/patient expects to be discharged to:: Private residence Living Arrangements: Other relatives (nephew) Available Help at Discharge: Family;Available PRN/intermittently (niece in/out, nephew stays the night) Type of Home: House Home Access: Stairs to enter   [employer] of Steps: 2 steps with posts to hold on to Home Layout: One level Home Equipment: Environmental consultant - 2 wheels;Cane - single point;Grab bars - tub/shower;Bedside commode      Prior Function Level of Independence: Needs assistance   Gait / Transfers Assistance Needed: pt reports ind with transfers, normally not using DME but currently using SPC due to fatigue  ADL's / Homemaking Assistance Needed: pt reports nephew and niece are cooking and cleaning, transportation, niece manages medications; ind with bathing and dressing  Comments: Pt reports in/out of the hospital lately so relying more on family to assist     Hand Dominance        Extremity/Trunk Assessment   Upper Extremity Assessment Upper Extremity Assessment: Overall WFL for tasks assessed    Lower Extremity Assessment Lower Extremity Assessment: Overall WFL for tasks assessed    Cervical / Trunk  Assessment Cervical / Trunk Assessment: Normal  Communication   Communication: No difficulties  Cognition Arousal/Alertness: Awake/alert Behavior During Therapy: WFL for tasks assessed/performed Overall Cognitive Status:  Within Functional Limits for tasks assessed            General Comments      Exercises     Assessment/Plan    PT Assessment Patient needs continued PT services  PT Problem List Decreased activity tolerance;Decreased balance;Cardiopulmonary status limiting activity       PT Treatment Interventions DME instruction;Gait training;Stair training;Functional mobility training;Therapeutic activities;Therapeutic exercise;Balance training;Patient/family education    PT Goals (Current goals can be found in the Care Plan section)  Acute Rehab PT Goals Patient Stated Goal: "walk" PT Goal Formulation: With patient Time For Goal Achievement: 12/05/20 Potential to Achieve Goals: Good    Frequency Min 3X/week   Barriers to discharge        Co-evaluation               AM-PAC PT "6 Clicks" Mobility  Outcome Measure Help needed turning from your back to your side while in a flat bed without using bedrails?: None Help needed moving from lying on your back to sitting on the side of a flat bed without using bedrails?: None Help needed moving to and from a bed to a chair (including a wheelchair)?: None Help needed standing up from a chair using your arms (e.g., wheelchair or bedside chair)?: None Help needed to walk in hospital room?: A Little Help needed climbing 3-5 steps with a railing? : A Little 6 Click Score: 22    End of Session Equipment Utilized During Treatment: Gait belt Activity Tolerance: Patient tolerated treatment well Patient left: in chair;with call bell/phone within reach;with chair alarm set;Other (comment) (dietary delivering lunch) Nurse Communication: Mobility status PT Visit Diagnosis: Other abnormalities of gait and mobility (R26.89)    Time: 3220-2542 PT Time Calculation (min) (ACUTE ONLY): 22 min   Charges:   PT Evaluation $PT Eval Low Complexity: 1 Low           Tori Shiane Wenberg PT, DPT 11/21/20, 1:09 PM

## 2020-11-21 NOTE — H&P (Signed)
TRH H&P    Patient Demographics:    Julia Hart, is a 87 y.o. female  MRN: [id number]  DOB - [birth year]  Admit Date - 11/20/2020  Referring MD/NP/PA: Long  Outpatient Primary MD for the patient is [name], MD  Patient coming from: Home  Chief complaint- dyspnea   HPI:    Julia Hart  is a 87 y.o. female, with history of hyperlipidemia, hypertension, coronary artery disease, and combined systolic and diastolic dysfunction CHF, presents to the ED with a chief complaint of dyspnea.  Of note patient's last echo was from January 14 to 20, 2022 and shows an EF of 65 to 70% with grade 2 diastolic dysfunction.  Patient has been on torsemide at home.  Niece at bedside reports that they have been titrating the torsemide at home.  PCP has advised to increase the torsemide to twice a day for 3 days at a time.  Niece reports that the fluid retention stabilizes when they do that, but does not decrease, and goes right back to fluid retention when they go back to once a day.  Patient reports that over the last week she has had a constant fluid buildup.  She reports that her baseline weight is 134 pounds.  Today her weight was 145 pounds.  The titration of the torsemide has been happening over the last 2 weeks.  Patient reports decreased urine output as expected for the torsemide increase.  She reports dyspnea on exertion, and orthopnea.  She reports swelling in her legs.  She denies any chest pain or palpitations, dizziness, dysuria, diarrhea.  She admits to constipation for which she takes prune juice and that helps.  She reports that today her belly feels distended like it is retaining fluid as well.  They decided to present to the ER today because her activities have been so limited secondary to her shortness of breath.  In the ED Temp 98.2, heart rate 48-81, respiratory rate 15-32, blood pressure  145/50-185/64 Leukopenia with a white blood cell count of 3.1-normal for her seems to be around 4, hemoglobin 9.4 Chemistry panel reveals her creatinine bumped to 1.7 from baseline 1.3 Chest x-ray shows cardiomegaly without overt edema Lasix 60 mg given in the ED Admission requested for early CHF exacerbation    Review of systems:    In addition to the HPI above,  No Fever-chills, No Headache, No changes with Vision or hearing, No problems swallowing food or Liquids, No Chest pain, admits to shortness of breath No Abdominal pain, No Nausea or Vomiting, bowel movements are regular, No Blood in stool or Urine, No dysuria, No new skin rashes or bruises, No new joints pains-aches,  No new weakness, tingling, numbness in any extremity, No recent weight gain or loss, No polyuria, polydypsia or polyphagia, No significant Mental Stressors.  All other systems reviewed and are negative.    Past History of the following :    Past Medical History:  Diagnosis Date  . CAD (coronary artery disease)   . HTN (hypertension)   . Hyperlipidemia  Past Surgical History:  Procedure Laterality Date  . ABDOMINAL HYSTERECTOMY    . BIOPSY  01/13/2017   Procedure: BIOPSY;  Surgeon: Danie Binder, MD;  [facility];  Service: Endoscopy;;  duodenal gastric   . COLONOSCOPY N/A 01/13/2017   Procedure: COLONOSCOPY;  Surgeon: Danie Binder, MD;  [facility];  Service: Endoscopy;  Laterality: N/A;  3:00pm-moved to 1:45pm per Ginger  . CORONARY ANGIOPLASTY WITH STENT PLACEMENT  2018  . CORONARY ARTERY BYPASS GRAFT  2008  . ESOPHAGOGASTRODUODENOSCOPY N/A 01/13/2017   Procedure: ESOPHAGOGASTRODUODENOSCOPY (EGD);  Surgeon: Danie Binder, MD;  [facility];  Service: Endoscopy;  Laterality: N/A;  . ESOPHAGOGASTRODUODENOSCOPY (EGD) WITH PROPOFOL N/A 06/29/2020   Procedure: ESOPHAGOGASTRODUODENOSCOPY (EGD) WITH PROPOFOL;  Surgeon: Daneil Dolin, MD;  [facility];  Service: Endoscopy;  Laterality: N/A;  1:45pm  . GIVENS CAPSULE STUDY N/A 09/05/2020   Procedure: GIVENS CAPSULE STUDY;  Surgeon: Eloise Harman, DO;  [facility];  Service: Endoscopy;  Laterality: N/A;  7:30am      Social History:      Social History   Tobacco Use  . Smoking status: Former Research scientist (life sciences)  . Smokeless tobacco: Never Used  Substance Use Topics  . Alcohol use: No       Family History :     Family History  Problem Relation Age of Onset  . Colon cancer Neg Hx   . Colon polyps Neg Hx       Home Medications:   Prior to Admission medications   Medication Sig Start Date End Date Taking? Authorizing Provider  acetaminophen (TYLENOL) 325 MG tablet Take 2 tablets (650 mg total) by mouth every 6 (six) hours as needed for mild pain, fever or headache (or Fever >/= 101). 09/18/20   Emokpae, Courage, MD  amLODipine (NORVASC) 5 MG tablet Take 5 mg by mouth daily.    [provider]  apixaban (ELIQUIS) 2.5 MG TABS tablet Take 1 tablet (2.5 mg total) by mouth 2 (two) times daily. 09/18/20   Roxan Hockey, MD  aspirin EC 81 MG tablet Take 1 tablet (81 mg total) by mouth daily with breakfast. 09/18/20 09/18/21  Roxan Hockey, MD  atorvastatin (LIPITOR) 20 MG tablet Take 20 mg by mouth at bedtime.     [provider]  empagliflozin (JARDIANCE) 10 MG TABS tablet Take 10 mg by mouth daily.    [provider]  ENTRESTO 97-103 MG Take 0.5 tablets by mouth 2 (two) times daily. 09/19/20   Roxan Hockey, MD  feeding supplement (ENSURE ENLIVE / ENSURE PLUS) LIQD Take 237 mLs by mouth 2 (two) times daily. 09/18/20   Roxan Hockey, MD  ferrous sulfate 325 (65 FE) MG EC tablet Take 1 tablet (325 mg total) by mouth 2 (two) times daily with a meal. 09/18/20   Emokpae, Courage, MD  guaiFENesin (MUCINEX) 600 MG 12 hr tablet Take 600 mg by mouth 2 (two) times daily.    [provider]  hydrALAZINE (APRESOLINE) 50 MG tablet Take 1  tablet (50 mg total) by mouth 2 (two) times daily. 09/18/20   Roxan Hockey, MD  isosorbide dinitrate (ISORDIL) 5 MG tablet Take 5 mg by mouth 2 (two) times daily.  04/24/20   [provider]  metoprolol succinate (TOPROL-XL) 25 MG 24 hr tablet Take 25 mg by mouth daily.    [provider]  nitroGLYCERIN (NITROSTAT) 0.4 MG SL tablet Place 0.4 mg under the tongue  every 5 (five) minutes as needed for chest pain.    [provider]  pantoprazole (PROTONIX) 40 MG tablet Take 40 mg by mouth daily. 05/17/20   [provider]  polyethylene glycol (MIRALAX / GLYCOLAX) 17 g packet Take 17 g by mouth daily as needed for mild constipation. 09/18/20   Roxan Hockey, MD  prednisoLONE acetate (PRED FORTE) 1 % ophthalmic suspension Place 1 drop into both eyes every other day. 03/15/20   [provider]  senna-docusate (SENOKOT-S) 8.6-50 MG tablet Take 2 tablets by mouth at bedtime. 09/18/20 09/18/21  Roxan Hockey, MD  torsemide (DEMADEX) 20 MG tablet Take 1 tablet (20 mg total) by mouth daily. 09/18/20   Roxan Hockey, MD     Allergies:    No Known Allergies   Physical Exam:   Vitals  Blood pressure (!) 157/60, pulse (!) 51, temperature 97.9 F (36.6 C), resp. rate 18, SpO2 100 %.  1.  General: Patient lying supine in bed with head of bed elevated, no acute distress  2. Psychiatric: Mood and behavior normal for situation, oriented to person place and context, pleasant, cooperative with exam  3. Neurologic: Face is symmetric, speech and language are normal, moves all 4 extremities voluntarily, no acute deficits on limited exam  4. HEENMT:  Head is atraumatic, normocephalic, pupils reactive to light, neck is cachectic, trachea is midline  5. Respiratory : Lungs with fine crackles at the bases, no wheezes or rhonchi, no increased work of breathing  6. Cardiovascular : Heart rate is normal, rhythm is regular, systolic murmur, no rubs or gallops,  JVD, peripheral edema present  7. Gastrointestinal:  Abdomen is soft, mildly distended, no tenderness, bowel sounds active  8. Skin:  Skin is warm dry and intact without acute lesion on limited exam  9.Musculoskeletal:  2+ peripheral edema, no acute deformity, no calf tenderness    Data Review:    CBC Recent Labs  Lab 11/20/20 1840 11/21/20 0213  WBC 3.1* 3.3*  HGB 9.4* 9.7*  HCT 28.8* 29.8*  PLT 136* 128*  MCV 87.3 88.2  MCH 28.5 28.7  MCHC 32.6 32.6  RDW 15.4 15.5  LYMPHSABS 1.1 1.1  MONOABS 0.5 0.5  EOSABS 0.1 0.1  BASOSABS 0.0 0.0   ------------------------------------------------------------------------------------------------------------------  Results for orders placed or performed during the hospital encounter of 11/20/20 (from the past 48 hour(s))  Resp Panel by RT-PCR (Flu A&B, Covid) Nasopharyngeal Swab     Status: None   Collection Time: 11/20/20  6:21 PM   Specimen: Nasopharyngeal Swab; Nasopharyngeal(NP) swabs in vial transport medium  Result Value Ref Range   SARS Coronavirus 2 by RT PCR NEGATIVE NEGATIVE    Comment: (NOTE) SARS-CoV-2 target nucleic acids are NOT DETECTED.  The SARS-CoV-2 RNA is generally detectable in upper respiratory specimens during the acute phase of infection. The lowest concentration of SARS-CoV-2 viral copies this assay can detect is 138 copies/mL. A negative result does not preclude SARS-Cov-2 infection and should not be used as the sole basis for treatment or other patient management decisions. A negative result may occur with  improper specimen collection/handling, submission of specimen other than nasopharyngeal swab, presence of viral mutation(s) within the areas targeted by this assay, and inadequate number of viral copies(<138 copies/mL). A negative result must be combined with clinical observations, patient history, and epidemiological information. The expected result is Negative.  Fact Sheet for Patients:   EntrepreneurPulse.com.au  Fact Sheet for Healthcare Providers:  IncredibleEmployment.be  This test is no t yet  approved or cleared by the [place] and  has been authorized for detection and/or diagnosis of SARS-CoV-2 by FDA under an Emergency Use Authorization (EUA). This EUA will remain  in effect (meaning this test can be used) for the duration of the COVID-19 declaration under Section 564(b)(1) of the Act, 21 U.S.C.section 360bbb-3(b)(1), unless the authorization is terminated  or revoked sooner.       Influenza A by PCR NEGATIVE NEGATIVE   Influenza B by PCR NEGATIVE NEGATIVE    Comment: (NOTE) The Xpert Xpress SARS-CoV-2/FLU/RSV plus assay is intended as an aid in the diagnosis of influenza from Nasopharyngeal swab specimens and should not be used as a sole basis for treatment. Nasal washings and aspirates are unacceptable for Xpert Xpress SARS-CoV-2/FLU/RSV testing.  Fact Sheet for Patients: EntrepreneurPulse.com.au  Fact Sheet for Healthcare Providers: IncredibleEmployment.be  This test is not yet approved or cleared by the [place] FDA and has been authorized for detection and/or diagnosis of SARS-CoV-2 by FDA under an Emergency Use Authorization (EUA). This EUA will remain in effect (meaning this test can be used) for the duration of the COVID-19 declaration under Section 564(b)(1) of the Act, 21 U.S.C. section 360bbb-3(b)(1), unless the authorization is terminated or revoked.  Performed at [facility], [address], [place], [place] [address]   Comprehensive metabolic panel     Status: Abnormal   Collection Time: 11/20/20  6:40 PM  Result Value Ref Range   Sodium 132 (L) 135 - 145 mmol/L   Potassium 3.5 3.5 - 5.1 mmol/L   Chloride 100 98 - 111 mmol/L   CO2 23 22 - 32 mmol/L   Glucose, Bld 99 70 - 99 mg/dL    Comment: Glucose reference range applies only to samples taken  after fasting for at least 8 hours.   BUN 50 (H) 8 - 23 mg/dL   Creatinine, Ser 1.74 (H) 0.44 - 1.00 mg/dL   Calcium 8.7 (L) 8.9 - 10.3 mg/dL   Total Protein 7.6 6.5 - 8.1 g/dL   Albumin 3.3 (L) 3.5 - 5.0 g/dL   AST 27 15 - 41 U/L   ALT 12 0 - 44 U/L   Alkaline Phosphatase 76 38 - 126 U/L   Total Bilirubin 0.7 0.3 - 1.2 mg/dL   GFR, Estimated 27 (L) >60 mL/min    Comment: (NOTE) Calculated using the CKD-EPI Creatinine Equation (2021)    Anion gap 9 5 - 15    Comment: Performed at [facility], [address] [place], [place] [address]  Brain natriuretic peptide     Status: Abnormal   Collection Time: 11/20/20  6:40 PM  Result Value Ref Range   B Natriuretic Peptide >4,500.0 (H) 0.0 - 100.0 pg/mL    Comment: Performed at [facility], [address], [place], [place] [address]  Troponin I (High Sensitivity)     Status: Abnormal   Collection Time: 11/20/20  6:40 PM  Result Value Ref Range   Troponin I (High Sensitivity) 90 (H) <18 ng/L    Comment: (NOTE) Elevated high sensitivity troponin I (hsTnI) values and significant  changes across serial measurements may suggest ACS but many other  chronic and acute conditions are known to elevate hsTnI results.  Refer to the "Links" section for chest pain algorithms and additional  guidance. Performed at [facility], [address] [place],  [address]   CBC with Differential     Status: Abnormal   Collection Time: 11/20/20  6:40 PM  Result  Value Ref Range   WBC 3.1 (L) 4.0 - 10.5 K/uL   RBC 3.30 (L) 3.87 - 5.11 MIL/uL   Hemoglobin 9.4 (L) 12.0 - 15.0 g/dL   HCT 28.8 (L) 36.0 - 46.0 %   MCV 87.3 80.0 - 100.0 fL   MCH 28.5 26.0 - 34.0 pg   MCHC 32.6 30.0 - 36.0 g/dL   RDW 15.4 11.5 - 15.5 %   Platelets 136 (L) 150 - 400 K/uL   nRBC 0.0 0.0 - 0.2 %   Neutrophils Relative % 43 %   Neutro Abs 1.4 (L) 1.7 - 7.7 K/uL   Lymphocytes Relative 35 %   Lymphs Abs 1.1 0.7 - 4.0 K/uL   Monocytes Relative 16 %   Monocytes Absolute 0.5  0.1 - 1.0 K/uL   Eosinophils Relative 5 %   Eosinophils Absolute 0.1 0.0 - 0.5 K/uL   Basophils Relative 1 %   Basophils Absolute 0.0 0.0 - 0.1 K/uL   Immature Granulocytes 0 %   Abs Immature Granulocytes 0.00 0.00 - 0.07 K/uL    Comment: Performed at [facility], [address], [place], [place] [address]  Troponin I (High Sensitivity)     Status: Abnormal   Collection Time: 11/20/20  8:13 PM  Result Value Ref Range   Troponin I (High Sensitivity) 101 (HH) <18 ng/L    Comment: CRITICAL RESULT CALLED TO, READ BACK BY AND VERIFIED WITH: HENDERSON,J ON 11/20/20 AT 2120 BY LOY,C (NOTE) Elevated high sensitivity troponin I (hsTnI) values and significant  changes across serial measurements may suggest ACS but many other  chronic and acute conditions are known to elevate hsTnI results.  Refer to the Links section for chest pain algorithms and additional  guidance. Performed at [facility], [address] [place], [place] [address]   Glucose, capillary     Status: None   Collection Time: 11/21/20  1:07 AM  Result Value Ref Range   Glucose-Capillary 89 70 - 99 mg/dL    Comment: Glucose reference range applies only to samples taken after fasting for at least 8 hours.   Comment 1 Notify RN    Comment 2 Document in Chart   Comprehensive metabolic panel     Status: Abnormal   Collection Time: 11/21/20  2:13 AM  Result Value Ref Range   Sodium 132 (L) 135 - 145 mmol/L   Potassium 3.1 (L) 3.5 - 5.1 mmol/L   Chloride 99 98 - 111 mmol/L   CO2 23 22 - 32 mmol/L   Glucose, Bld 112 (H) 70 - 99 mg/dL    Comment: Glucose reference range applies only to samples taken after fasting for at least 8 hours.   BUN 49 (H) 8 - 23 mg/dL   Creatinine, Ser 1.68 (H) 0.44 - 1.00 mg/dL   Calcium 8.7 (L) 8.9 - 10.3 mg/dL   Total Protein 7.5 6.5 - 8.1 g/dL   Albumin 3.4 (L) 3.5 - 5.0 g/dL   AST 24 15 - 41 U/L   ALT 10 0 - 44 U/L   Alkaline Phosphatase 73 38 - 126 U/L   Total Bilirubin 0.9 0.3 - 1.2 mg/dL    GFR, Estimated 28 (L) >60 mL/min    Comment: (NOTE) Calculated using the CKD-EPI Creatinine Equation (2021)    Anion gap 10 5 - 15    Comment: Performed at [facility], [address], [place], [place] [address]  Magnesium     Status: None   Collection Time: 11/21/20  2:13  AM  Result Value Ref Range   Magnesium 1.8 1.7 - 2.4 mg/dL    Comment: Performed at [facility], [address] [place], [place] [address]  CBC WITH DIFFERENTIAL     Status: Abnormal   Collection Time: 11/21/20  2:13 AM  Result Value Ref Range   WBC 3.3 (L) 4.0 - 10.5 K/uL   RBC 3.38 (L) 3.87 - 5.11 MIL/uL   Hemoglobin 9.7 (L) 12.0 - 15.0 g/dL   HCT 29.8 (L) 36.0 - 46.0 %   MCV 88.2 80.0 - 100.0 fL   MCH 28.7 26.0 - 34.0 pg   MCHC 32.6 30.0 - 36.0 g/dL   RDW 15.5 11.5 - 15.5 %   Platelets 128 (L) 150 - 400 K/uL   nRBC 0.0 0.0 - 0.2 %   Neutrophils Relative % 46 %   Neutro Abs 1.5 (L) 1.7 - 7.7 K/uL   Lymphocytes Relative 34 %   Lymphs Abs 1.1 0.7 - 4.0 K/uL   Monocytes Relative 15 %   Monocytes Absolute 0.5 0.1 - 1.0 K/uL   Eosinophils Relative 4 %   Eosinophils Absolute 0.1 0.0 - 0.5 K/uL   Basophils Relative 1 %   Basophils Absolute 0.0 0.0 - 0.1 K/uL   Immature Granulocytes 0 %   Abs Immature Granulocytes 0.01 0.00 - 0.07 K/uL    Comment: Performed at [facility], [address] [place], [place] [address]  Troponin I (High Sensitivity)     Status: Abnormal   Collection Time: 11/21/20  2:13 AM  Result Value Ref Range   Troponin I (High Sensitivity) 93 (H) <18 ng/L    Comment: (NOTE) Elevated high sensitivity troponin I (hsTnI) values and significant  changes across serial measurements may suggest ACS but many other  chronic and acute conditions are known to elevate hsTnI results.  Refer to the "Links" section for chest pain algorithms and additional  guidance. Performed at [facility], [address], [place], [place] [address]     Chemistries  Recent Labs  Lab 11/20/20 1840 11/21/20 0213   NA 132* 132*  K 3.5 3.1*  CL 100 99  CO2 23 23  GLUCOSE 99 112*  BUN 50* 49*  CREATININE 1.74* 1.68*  CALCIUM 8.7* 8.7*  MG  --  1.8  AST 27 24  ALT 12 10  ALKPHOS 76 73  BILITOT 0.7 0.9   ------------------------------------------------------------------------------------------------------------------  ------------------------------------------------------------------------------------------------------------------ GFR: CrCl cannot be calculated (Unknown ideal weight.). Liver Function Tests: Recent Labs  Lab 11/20/20 1840 11/21/20 0213  AST 27 24  ALT 12 10  ALKPHOS 76 73  BILITOT 0.7 0.9  PROT 7.6 7.5  ALBUMIN 3.3* 3.4*   No results for input(s): LIPASE, AMYLASE in the last 168 hours. No results for input(s): AMMONIA in the last 168 hours. Coagulation Profile: No results for input(s): INR, PROTIME in the last 168 hours. Cardiac Enzymes: No results for input(s): CKTOTAL, CKMB, CKMBINDEX, TROPONINI in the last 168 hours. BNP (last 3 results) No results for input(s): PROBNP in the last 8760 hours. HbA1C: No results for input(s): HGBA1C in the last 72 hours. CBG: Recent Labs  Lab 11/21/20 0107  GLUCAP 89   Lipid Profile: No results for input(s): CHOL, HDL, LDLCALC, TRIG, CHOLHDL, LDLDIRECT in the last 72 hours. Thyroid Function Tests: No results for input(s): TSH, T4TOTAL, FREET4, T3FREE, THYROIDAB in the last 72 hours. Anemia Panel: No results for input(s): VITAMINB12, FOLATE, FERRITIN, TIBC, IRON, RETICCTPCT in the last 72 hours.  --------------------------------------------------------------------------------------------------------------- Urine analysis:  No results found for: COLORURINE, APPEARANCEUR, LABSPEC, PHURINE, GLUCOSEU, HGBUR, BILIRUBINUR, KETONESUR, PROTEINUR, UROBILINOGEN, NITRITE, LEUKOCYTESUR    Imaging Results:    DG Chest Portable 1 View  Result Date: 11/20/2020 CLINICAL DATA:  Dyspnea, COVID infection a few months prior EXAM: PORTABLE  CHEST 1 VIEW COMPARISON:  09/14/2020 chest radiograph. FINDINGS: Stable configuration of median sternotomy wires with chronic lower most sternotomy wire discontinuity. Stable cardiomediastinal silhouette with moderate cardiomegaly. No pneumothorax. No pleural effusion. Cephalization of the pulmonary vasculature without overt pulmonary edema. No acute consolidative airspace disease. IMPRESSION: Moderate cardiomegaly without overt pulmonary edema. No active pulmonary disease. Electronically Signed   By: Ilona Sorrel M.D.   On: 11/20/2020 19:01       Assessment & Plan:    Principal Problem:   CHF (congestive heart failure) (HCC) Active Problems:   Elevated troponin   AKI (acute kidney injury) ([place])   Hyponatremia   Mild protein-calorie malnutrition (HCC)   Diabetes mellitus type 2 in nonobese ([place])   1. CHF exacerbation 1. Recent echo in January 2022 shows an ejection fraction of 65 to 70% with grade 2 diastolic dysfunction 2. Given how recent her last echo was, and no repeat echo ordered 3. Continue Lasix 60 mg 4. Strict I's and O's, daily weights 5. Low-sodium diet with fluid restriction 6. Troponin was mildly elevated, peaked at 101, likely demand ischemia in the setting of CHF exacerbation 7. Continue to monitor on telemetry 2. Elevated troponin 1. Secondary to above-demand ischemia 2. Peaked at 101 3. Monitor on telemetry 3. Hyponatremia 1. Hypervolemic hyponatremia 2. Continue Lasix 3. Trend in the a.m. 4. Mild protein calorie malnutrition 1. Continue feeding supplement 2. Continue to monitor 5. AKI 1. Mild with a bump from 1.3-1.7 2. Cardiorenal syndrome 3. Continue treatment for #1 6. Leukopenia 1. White blood cell count today 3.1 2. Baseline seems to be around 4 3. No infectious symptoms 4. Trend in the a.m. 7. Diabetes mellitus type 2 1. Hold Jardiance 2. Sliding scale coverage 3. Continue to monitor 8.    DVT Prophylaxis-Heparin- SCDs  AM Labs Ordered,  also please review Full Orders  Family Communication: Admission, patients condition and plan of care including tests being ordered have been discussed with the patient and niece who indicate understanding and agree with the plan and Code Status.  Code Status: Full  Admission status: ObservationTime spent in minutes : [place]

## 2020-11-21 NOTE — Progress Notes (Addendum)
PROGRESS NOTE    Patient: Julia Hart                            PCP: [name], MD                    DOB: 08/30/1926            DOA: 11/20/2020 [id number]             DOS: 11/21/2020, 1:09 PM   LOS: 0 days   Date of Service: The patient was seen and examined on 11/21/2020  Subjective:   The patient was seen and examined this morning. Stable at this time. Laying in bed comfortable with mild shortness of breath, actually off oxygen satting 99% Complaining lower extremity edema, generalized weaknesses Otherwise no issues overnight .  Brief Narrative:   Citlaly Camplin  is a 87 y.o. female, with history of hyperlipidemia, hypertension, coronary artery disease, and combined systolic-diastolic dysfunction CHF, presents to the ED with a chief complaint of dyspnea.   Echo was from January 14 to 20, 2022 and shows an EF of 65 to 70% with grade 2 diastolic dysfunction.  Patient has been on torsemide at home.  Niece at bedside reports that they have been titrating the torsemide at home.  PCP has advised to increase the torsemide to twice a day for 3 days at a time.  Niece reports that the fluid retention stabilizes when they do that, but does not decrease, and goes right back to fluid retention when they go back to once a day.  Patient reports that over the last week she has had a constant fluid buildup.  She reports that her baseline weight is 134 pounds. Now her weight was 145 pounds.    Over 2 past  weeks.    In the ED Temp 98.2, heart rate 48-81, respiratory rate 15-32, blood pressure 145/50-185/64 Leukopenia with a white blood cell count of 3.1-normal for her seems to be around 4, hemoglobin 9.4 Chemistry panel reveals her creatinine bumped to 1.7 from baseline 1.3 Chest x-ray shows cardiomegaly without overt edema Lasix 60 mg given in the ED   Assessment & Plan:   Principal Problem:   CHF (congestive heart failure) (HCC) Active Problems:   Elevated troponin   AKI (acute kidney  injury) ([place])   Hyponatremia   Mild protein-calorie malnutrition (HCC)   Diabetes mellitus type 2 in nonobese ([place])   1. Acute on chronic Systolic - diastolic congestive heart failure exacerbation 1. Still complaining of mild shortness of breath, generalized weakness, lower extremity edema 2. Recent echo in January 2022 shows an ejection fraction of 65 to 70% with grade 2 diastolic dysfunction 3. Given how recent her last echo was, and no repeat echo ordered 4. Continue Lasix 60 mg 5. Strict I's and O's, daily weights 6. Low-sodium diet with fluid restriction 7. Troponin was mildly elevated, peaked at 101, likely demand ischemia in the setting of CHF exacerbation ... Denies any chest pain, no EKG changes 8. Monitoring on telemetry 9. Consult, appreciate input 2. Make demand ischemia - with elevated troponin 1. Acute on chronic diastolic congestive heart failure exacerbation, 2. Peaked at 101 3. Denies any chest pain, no EKG changes 4. Monitor on telemetry 5. As needed nitroglycerin, aspirin, statins 3. Hyponatremia /hypokalemia 1. Hypervolemic hyponatremia: 132, 132 2. Potassium 3.5, 3.1 --- repleting orally 3. Continue Lasix for now monitoring electrolytes 4.  4. Mild  protein calorie malnutrition 1. Continue feeding supplement 2. Continue to monitor 3. Consulting dietitian 5. AKI 1. Mild with a bump from 1.3-1.7 >>> 0.68, 2. Cardiorenal syndrome 3. Continue treatment for #1 6. Leukopenia 1. White blood cell count today 3.1 2. Baseline seems to be around 4 3. No infectious symptoms 4. Monitoring closely 7. Diabetes mellitus type 2 1. Hold Jardiance 2. BG QA CHS, SSI coverage 3.   --------------------------------------------------------------------------------------------------------------------------------------- Nutritional status:  The patient's BMI is: There is no height or weight on file to calculate BMI. I agree with the assessment and plan as outlined   --------------------------------------------------------------------------------------------------------------------------------------- Cultures; None   Antimicrobials: None    Consultants: Cardiology  -------------------------------------------------------------------------------------------------------------------------------------  DVT prophylaxis:  SCD/Compression stockings and Heparin SQ Code Status:   Code Status: DNR  Family Communication:  No family member present at bedside- attempt will be made to update daily The above findings and plan of care has been discussed with patient,  they expressed understanding and agreement of above. -Advance care planning has been discussed.   Admission status:   Status is: Observation  The patient remains OBS appropriate and will d/c before 2 midnights.  Dispo: The patient is from: Home              Anticipated d/c is to: Home w HH in 1-2 days               Patient currently is not medically stable to d/c.   Difficult to place patient No      Level of care: Telemetry   Procedures:   No admission procedures for hospital encounter.     Antimicrobials:  Anti-infectives (From admission, onward)   None       Medication:  . amLODipine  5 mg Oral Daily  . apixaban  2.5 mg Oral BID  . aspirin EC  81 mg Oral Q breakfast  . atorvastatin  20 mg Oral QHS  . feeding supplement  237 mL Oral BID  . furosemide  60 mg Intravenous BID  . hydrALAZINE  50 mg Oral BID  . insulin aspart  0-15 Units Subcutaneous TID WC  . insulin aspart  0-5 Units Subcutaneous QHS  . isosorbide dinitrate  5 mg Oral BID  . metoprolol succinate  25 mg Oral Daily  . sacubitril-valsartan  0.5 tablet Oral BID    acetaminophen **OR** acetaminophen, ondansetron **OR** ondansetron (ZOFRAN) IV, oxyCODONE, polyethylene glycol   Objective:   Vitals:   11/21/20 0042 11/21/20 0430 11/21/20 0914 11/21/20 1203  BP: (!) 157/60 135/75 (!) 122/41 (!)  158/51  Pulse: (!) 51 76 60 61  Resp: 18 18  18   Temp: 97.9 F (36.6 C) 97.9 F (36.6 C)  97.7 F (36.5 C)  TempSrc:  Oral  Oral  SpO2: 100% 100%  99%    Intake/Output Summary (Last 24 hours) at 11/21/2020 1309 Last data filed at 11/21/2020 0900 Gross per 24 hour  Intake 240 ml  Output --  Net 240 ml   There were no vitals filed for this visit.   Examination:   Physical Exam  Constitution:  Alert, cooperative, no distress,  Appears calm and comfortable  Psychiatric: Normal and stable mood and affect, cognition intact,   HEENT: Normocephalic, PERRL, otherwise with in Normal limits  Chest:Chest symmetric Cardio vascular:  S1/S2, RRR, + murmure, No Rubs or Gallops  pulmonary: Clear to auscultation bilaterally, respirations unlabored, negative wheezes / crackles Abdomen: Soft, non-tender, non-distended, bowel sounds,no masses, no organomegaly Muscular skeletal:  Generalized  weaknesses Limited exam - in bed, able to move all 4 extremities, Normal strength,  Neuro: CNII-XII intact. , normal motor and sensation, reflexes intact  Extremities: +2  lower extremities, +2 pulses  Skin: Dry, warm to touch, negative for any Rashes, No open wounds Wounds: per nursing documentation    ------------------------------------------------------------------------------------------------------------------------------------------    LABs:  CBC Latest Ref Rng & Units 11/21/2020 11/20/2020 09/18/2020  WBC 4.0 - 10.5 K/uL 3.3(L) 3.1(L) 4.1  Hemoglobin 12.0 - 15.0 g/dL 9.7(L) 9.4(L) 8.7(L)  Hematocrit 36.0 - 46.0 % 29.8(L) 28.8(L) 26.7(L)  Platelets 150 - 400 K/uL 128(L) 136(L) 289   CMP Latest Ref Rng & Units 11/21/2020 11/20/2020 09/17/2020  Glucose 70 - 99 mg/dL 112(H) 99 86  BUN 8 - 23 mg/dL 49(H) 50(H) 41(H)  Creatinine 0.44 - 1.00 mg/dL 1.68(H) 1.74(H) 1.38(H)  Sodium 135 - 145 mmol/L 132(L) 132(L) 135  Potassium 3.5 - 5.1 mmol/L 3.1(L) 3.5 4.1  Chloride 98 - 111 mmol/L 99 100 104  CO2 22 - 32  mmol/L 23 23 25   Calcium 8.9 - 10.3 mg/dL 8.7(L) 8.7(L) 8.0(L)  Total Protein 6.5 - 8.1 g/dL 7.5 7.6 -  Total Bilirubin 0.3 - 1.2 mg/dL 0.9 0.7 -  Alkaline Phos 38 - 126 U/L 73 76 -  AST 15 - 41 U/L 24 27 -  ALT 0 - 44 U/L 10 12 -       Micro Results Recent Results (from the past 240 hour(s))  Resp Panel by RT-PCR (Flu A&B, Covid) Nasopharyngeal Swab     Status: None   Collection Time: 11/20/20  6:21 PM   Specimen: Nasopharyngeal Swab; Nasopharyngeal(NP) swabs in vial transport medium  Result Value Ref Range Status   SARS Coronavirus 2 by RT PCR NEGATIVE NEGATIVE Final    Comment: (NOTE) SARS-CoV-2 target nucleic acids are NOT DETECTED.  The SARS-CoV-2 RNA is generally detectable in upper respiratory specimens during the acute phase of infection. The lowest concentration of SARS-CoV-2 viral copies this assay can detect is 138 copies/mL. A negative result does not preclude SARS-Cov-2 infection and should not be used as the sole basis for treatment or other patient management decisions. A negative result may occur with  improper specimen collection/handling, submission of specimen other than nasopharyngeal swab, presence of viral mutation(s) within the areas targeted by this assay, and inadequate number of viral copies(<138 copies/mL). A negative result must be combined with clinical observations, patient history, and epidemiological information. The expected result is Negative.  Fact Sheet for Patients:  EntrepreneurPulse.com.au  Fact Sheet for Healthcare Providers:  IncredibleEmployment.be  This test is no t yet approved or cleared by the [place] FDA and  has been authorized for detection and/or diagnosis of SARS-CoV-2 by FDA under an Emergency Use Authorization (EUA). This EUA will remain  in effect (meaning this test can be used) for the duration of the COVID-19 declaration under Section 564(b)(1) of the Act, 21 U.S.C.section  360bbb-3(b)(1), unless the authorization is terminated  or revoked sooner.       Influenza A by PCR NEGATIVE NEGATIVE Final   Influenza B by PCR NEGATIVE NEGATIVE Final    Comment: (NOTE) The Xpert Xpress SARS-CoV-2/FLU/RSV plus assay is intended as an aid in the diagnosis of influenza from Nasopharyngeal swab specimens and should not be used as a sole basis for treatment. Nasal washings and aspirates are unacceptable for Xpert Xpress SARS-CoV-2/FLU/RSV testing.  Fact Sheet for Patients: EntrepreneurPulse.com.au  Fact Sheet for Healthcare Providers: IncredibleEmployment.be  This test is not yet  approved or cleared by the [place] and has been authorized for detection and/or diagnosis of SARS-CoV-2 by FDA under an Emergency Use Authorization (EUA). This EUA will remain in effect (meaning this test can be used) for the duration of the COVID-19 declaration under Section 564(b)(1) of the Act, 21 U.S.C. section 360bbb-3(b)(1), unless the authorization is terminated or revoked.  Performed at [facility], [address], [place], [place] [address]     Radiology Reports DG Chest Portable 1 View  Result Date: 11/20/2020 CLINICAL DATA:  Dyspnea, COVID infection a few months prior EXAM: PORTABLE CHEST 1 VIEW COMPARISON:  09/14/2020 chest radiograph. FINDINGS: Stable configuration of median sternotomy wires with chronic lower most sternotomy wire discontinuity. Stable cardiomediastinal silhouette with moderate cardiomegaly. No pneumothorax. No pleural effusion. Cephalization of the pulmonary vasculature without overt pulmonary edema. No acute consolidative airspace disease. IMPRESSION: Moderate cardiomegaly without overt pulmonary edema. No active pulmonary disease. Electronically Signed   By: Ilona Sorrel M.D.   On: 11/20/2020 19:01    SIGNED: Deatra James, MD, FHM. [facility],  Pager (please use [email or web address] to page/text) Please use  Epic Secure Chat for non-urgent communication (7AM-7PM)  If 7PM-7AM, please contact night-coverage [email or web address], 11/21/2020, 1:09 PM

## 2020-11-21 NOTE — Plan of Care (Signed)
  Problem: Acute Rehab PT Goals(only PT should resolve) Goal: Pt Will Ambulate Outcome: Progressing Flowsheets (Taken 11/21/2020 1313) Pt will Ambulate:  > 125 feet  with modified independence  with least restrictive assistive device Goal: Pt/caregiver will Perform Home Exercise Program Outcome: Progressing Flowsheets (Taken 11/21/2020 1313) Pt/caregiver will Perform Home Exercise Program: (for improved activity tolerance)  Independently  For increased strengthening   Tori Saphira Lahmann PT, DPT 11/21/20, 1:13 PM

## 2020-11-22 DIAGNOSIS — I5043 Acute on chronic combined systolic (congestive) and diastolic (congestive) heart failure: Secondary | ICD-10-CM | POA: Diagnosis not present

## 2020-11-22 DIAGNOSIS — N1832 Chronic kidney disease, stage 3b: Secondary | ICD-10-CM | POA: Diagnosis not present

## 2020-11-22 DIAGNOSIS — N183 Chronic kidney disease, stage 3 unspecified: Secondary | ICD-10-CM

## 2020-11-22 DIAGNOSIS — E871 Hypo-osmolality and hyponatremia: Secondary | ICD-10-CM | POA: Diagnosis not present

## 2020-11-22 DIAGNOSIS — R778 Other specified abnormalities of plasma proteins: Secondary | ICD-10-CM | POA: Diagnosis not present

## 2020-11-22 LAB — GLUCOSE, CAPILLARY
Glucose-Capillary: 85 mg/dL (ref 70–99)
Glucose-Capillary: 90 mg/dL (ref 70–99)
Glucose-Capillary: 122 mg/dL — ABNORMAL HIGH (ref 70–99)
Glucose-Capillary: 106 mg/dL — ABNORMAL HIGH (ref 70–99)

## 2020-11-22 MED ORDER — FUROSEMIDE 10 MG/ML IJ SOLN
80.0000 mg | Freq: Two times a day (BID) | INTRAMUSCULAR | Status: DC
  Administered 2020-11-22 – 2020-11-29 (×14): 80 mg via INTRAVENOUS
  Filled 2020-11-22 (×15): qty 8

## 2020-11-22 MED ORDER — LORATADINE 10 MG PO TABS
10.0000 mg | ORAL_TABLET | Freq: Every day | ORAL | Status: DC
  Administered 2020-11-22 – 2020-11-29 (×8): 10 mg via ORAL
  Filled 2020-11-22 (×8): qty 1

## 2020-11-22 MED ORDER — FUROSEMIDE 10 MG/ML IJ SOLN
20.0000 mg | Freq: Once | INTRAMUSCULAR | Status: AC
  Administered 2020-11-22: 20 mg via INTRAVENOUS
  Filled 2020-11-22: qty 2

## 2020-11-22 MED ORDER — CAMPHOR-MENTHOL 0.5-0.5 % EX LOTN
TOPICAL_LOTION | CUTANEOUS | Status: DC | PRN
  Filled 2020-11-22: qty 222

## 2020-11-22 NOTE — TOC Initial Note (Signed)
Transition of Care [facility]) - Initial/Assessment Note    Patient Details  Name: Julia Hart MRN: [id number] Date of Birth: [birth year]  [facility] CM/SW Contact:    Boneta Lucks, RN Phone Number: 11/22/2020, 11:47 AM  Clinical Narrative:   Patient admitted with CHF. Patient has a high risk for readmission. Patient lives at home with her nephew. TOC spoke with her niece, she provided transportation and assist in the home.  PT is recommending HHPT. Per Niece patient will not allow home health to assist. Patient has a rolling walker. Patient is following low sodium diet, weighing daily and adjusting medication as directed. Patient still in fluid overload with BNP over 4,000.  Niece is concerned and has question for cardiology and PCP. Patient is diuresing, discharge plan for Friday.             Expected Discharge Plan: Home/Self Care Barriers to Discharge: Continued Medical Work up  Patient Goals and CMS Choice Patient states their goals for this hospitalization and ongoing recovery are:: to go home. CMS Medicare.gov Compare Post Acute Care list provided to:: Patient Represenative (must comment) Choice offered to / list presented to :  (Niece)  Expected Discharge Plan and Services Expected Discharge Plan: Home/Self Care      Living arrangements for the past 2 months: Single Family Home         Prior Living Arrangements/Services Living arrangements for the past 2 months: Single Family Home Lives with:: Relatives Patient language and need for interpreter reviewed:: Yes Do you feel safe going back to the place where you live?: Yes      Need for Family Participation in Patient Care: Yes (Comment) Care giver support system in place?: Yes (comment) Current home services: DME Criminal Activity/Legal Involvement Pertinent to Current Situation/Hospitalization: No - Comment as needed  Activities of Daily Living Home Assistive Devices/Equipment: Cane (specify quad or  straight) ADL Screening (condition at time of admission) Patient's cognitive ability adequate to safely complete daily activities?: Yes Is the patient deaf or have difficulty hearing?: Yes Does the patient have difficulty seeing, even when wearing glasses/contacts?: No Does the patient have difficulty concentrating, remembering, or making decisions?: Yes Patient able to express need for assistance with ADLs?: Yes Does the patient have difficulty dressing or bathing?: No Independently performs ADLs?: Yes (appropriate for developmental age) Does the patient have difficulty walking or climbing stairs?: No Weakness of Legs: None Weakness of Arms/Hands: None  Permission Sought/Granted   Emotional Assessment      Orientation: : Oriented to Self,Oriented to Place,Oriented to  Time,Oriented to Situation Alcohol / Substance Use: Not Applicable Psych Involvement: No (comment)  Admission diagnosis:  CHF (congestive heart failure) (HCC) [I50.9] SOB (shortness of breath) [R06.02] Acute congestive heart failure, unspecified heart failure type ([place]) [I50.9] Patient Active Problem List   Diagnosis Date Noted  . Elevated troponin 11/21/2020  . AKI (acute kidney injury) ([place]) 11/21/2020  . Hyponatremia 11/21/2020  . Mild protein-calorie malnutrition ([place]) 11/21/2020  . Diabetes mellitus type 2 in nonobese ([place]) 11/21/2020  . CHF (congestive heart failure) ([place]) 11/20/2020  . Acute exacerbation of CHF (congestive heart failure) ([place]) 09/15/2020  . Acute on chronic systolic CHF (congestive heart failure) ([place]) 08/04/2020  . Decompensated heart failure ([place]) 08/03/2020  . Coronary artery disease 08/03/2020  . HTN (hypertension) 08/03/2020  . COVID-19 virus infection 08/03/2020  . Atrial fibrillation, chronic ([place]) 08/03/2020  . GERD (gastroesophageal reflux disease) 07/20/2020  . Melena 06/29/2020  . Abdominal pain 06/29/2020  .  Loss of weight   . Anemia 12/19/2016  . Change in bowel habits  12/19/2016   PCP:  [name], MD Pharmacy:   [facility], [place] [address] [address] [place] [address] Phone: [phone] Fax: [phone]   Readmission Risk Interventions Readmission Risk Prevention Plan 11/22/2020  Transportation Screening Complete  HRI or Home Care Consult Complete  Social Work Consult for [place] Planning/Counseling Complete  Palliative Care Screening Not Applicable  Medication Review Press photographer) Complete  Some recent data might be hidden

## 2020-11-22 NOTE — Progress Notes (Signed)
PROGRESS NOTE  Julia Hart GQQ:[id number] DOB: 12-Oct-1926 DOA: 11/20/2020 PCP: [name], MD  Brief History:  87 year old female with a history of coronary artery disease/NSTEMI, hypertension, hyperlipidemia, ischemic cardiomyopathy with previous EF 35% presenting with 1 week history of shortness of breath, orthopnea, and worsening lower extremity edema.  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 -6/71/2458 echo EF 50% -08/05/20 Echo 65-70%, no WMA, mod enlarge RV, mod TR; small pericardial effusion -pt states dry weight ~120 lbs -Previously had echo with a EF of 35% in [place] -Continue IV furosemide--increase to 80 mg bid -Accurate I's and O's -Continue isosorbide, hydralazine -Continue Entresto for now  CKD stage 3b -baseline creatinine 1.3-1.6 -monitor with diuresis  Coronary artery disease -history of NSTEMI and PCI of the ostial and proximal RCA 08/27/2017 -No chest pain presently -08/27/2017 cath--RCA ostial 80% calcified stenosis;occluded SVG jump graft;first OM 70% stenosis;90% circumflex stenosis -Continue Plavix and apixaban   Essential hypertension -Continue hydralazine and isosorbide -Holding amlodipine to allow margin for diuresis>>restart  Hyperlipidemia -Continue statin  GERD -Continue Protonix  Chronic atrial fibrillation -Rate controlled -Continue apixaban  Hypokalemia -repleted -mag 1.8  Hyponatremia -due to CHF -continue diuresis  Arm/Back itch -no visible rash on exam -start loratidine -start sarna -pt states it's intermittent for past one month    Status is: Inpatient  Remains inpatient appropriate because:IV treatments appropriate due to intensity of illness or inability to take PO   Dispo: The patient is from: Home              Anticipated d/c is to: Home              Patient currently is not medically stable to d/c.   Difficult to place patient No        Family  Communication:  no Family at bedside  Consultants:  cardiology  Code Status:  DNR  DVT Prophylaxis:  apixaban   Procedures: As Listed in Progress Note Above  Antibiotics: None       Subjective: Patient denies fevers, chills, headache, chest pain, dyspnea, nausea, vomiting, diarrhea, abdominal pain, dysuria, hematuria, hematochezia, and melena. Patient complains of itching of lower back and arms.  Objective: Vitals:   11/22/20 0852 11/22/20 1108 11/22/20 1110 11/22/20 1318  BP: (!) 149/71 (!) 174/74 (!) 174/74 (!) 160/49  Pulse: (!) 58 87  78  Resp:    19  Temp:    97.8 F (36.6 C)  TempSrc:    Oral  SpO2:    99%  Weight:        Intake/Output Summary (Last 24 hours) at 11/22/2020 1337 Last data filed at 11/22/2020 1300 Gross per 24 hour  Intake 1255.64 ml  Output 450 ml  Net 805.64 ml   Weight change:  Exam:   General:  Pt is alert, follows commands appropriately, not in acute distress  HEENT: No icterus, No thrush, No neck mass, [place]/AT  Cardiovascular: RRR, S1/S2, no rubs, no gallops  Respiratory: bibasilar rales. No wheeze  Abdomen: Soft/+BS, non tender, non distended, no guarding  Extremities: 2+LE edema, No lymphangitis, No petechiae, No rashes, no synovitis   Data Reviewed: I have personally reviewed following labs and imaging studies Basic Metabolic Panel: Recent Labs  Lab 11/20/20 1840 11/21/20 0213 11/21/20 1630  NA 132* 132* 133*  K 3.5 3.1* 4.0  CL 100 99 99  CO2 23 23 26   GLUCOSE 99 112* 100*  BUN 50* 49* 51*  CREATININE  1.74* 1.68* 1.78*  CALCIUM 8.7* 8.7* 8.8*  MG  --  1.8  --    Liver Function Tests: Recent Labs  Lab 11/20/20 1840 11/21/20 0213  AST 27 24  ALT 12 10  ALKPHOS 76 73  BILITOT 0.7 0.9  PROT 7.6 7.5  ALBUMIN 3.3* 3.4*   No results for input(s): LIPASE, AMYLASE in the last 168 hours. No results for input(s): AMMONIA in the last 168 hours. Coagulation Profile: No results for input(s): INR, PROTIME in the  last 168 hours. CBC: Recent Labs  Lab 11/20/20 1840 11/21/20 0213  WBC 3.1* 3.3*  NEUTROABS 1.4* 1.5*  HGB 9.4* 9.7*  HCT 28.8* 29.8*  MCV 87.3 88.2  PLT 136* 128*   Cardiac Enzymes: No results for input(s): CKTOTAL, CKMB, CKMBINDEX, TROPONINI in the last 168 hours. BNP: Invalid input(s): POCBNP CBG: Recent Labs  Lab 11/21/20 1154 11/21/20 1609 11/21/20 2008 11/22/20 0717 11/22/20 1104  GLUCAP 154* 105* 127* 85 90   HbA1C: Recent Labs    11/21/20 0213  HGBA1C 5.8*   Urine analysis: No results found for: COLORURINE, APPEARANCEUR, LABSPEC, PHURINE, GLUCOSEU, HGBUR, BILIRUBINUR, KETONESUR, PROTEINUR, UROBILINOGEN, NITRITE, LEUKOCYTESUR Sepsis Labs: @LABRCNTIP (procalcitonin:4,lacticidven:4) ) Recent Results (from the past 240 hour(s))  Resp Panel by RT-PCR (Flu A&B, Covid) Nasopharyngeal Swab     Status: None   Collection Time: 11/20/20  6:21 PM   Specimen: Nasopharyngeal Swab; Nasopharyngeal(NP) swabs in vial transport medium  Result Value Ref Range Status   SARS Coronavirus 2 by RT PCR NEGATIVE NEGATIVE Final    Comment: (NOTE) SARS-CoV-2 target nucleic acids are NOT DETECTED.  The SARS-CoV-2 RNA is generally detectable in upper respiratory specimens during the acute phase of infection. The lowest concentration of SARS-CoV-2 viral copies this assay can detect is 138 copies/mL. A negative result does not preclude SARS-Cov-2 infection and should not be used as the sole basis for treatment or other patient management decisions. A negative result may occur with  improper specimen collection/handling, submission of specimen other than nasopharyngeal swab, presence of viral mutation(s) within the areas targeted by this assay, and inadequate number of viral copies(<138 copies/mL). A negative result must be combined with clinical observations, patient history, and epidemiological information. The expected result is Negative.  Fact Sheet for Patients:   EntrepreneurPulse.com.au  Fact Sheet for Healthcare Providers:  IncredibleEmployment.be  This test is no t yet approved or cleared by the [place] FDA and  has been authorized for detection and/or diagnosis of SARS-CoV-2 by FDA under an Emergency Use Authorization (EUA). This EUA will remain  in effect (meaning this test can be used) for the duration of the COVID-19 declaration under Section 564(b)(1) of the Act, 21 U.S.C.section 360bbb-3(b)(1), unless the authorization is terminated  or revoked sooner.       Influenza A by PCR NEGATIVE NEGATIVE Final   Influenza B by PCR NEGATIVE NEGATIVE Final    Comment: (NOTE) The Xpert Xpress SARS-CoV-2/FLU/RSV plus assay is intended as an aid in the diagnosis of influenza from Nasopharyngeal swab specimens and should not be used as a sole basis for treatment. Nasal washings and aspirates are unacceptable for Xpert Xpress SARS-CoV-2/FLU/RSV testing.  Fact Sheet for Patients: EntrepreneurPulse.com.au  Fact Sheet for Healthcare Providers: IncredibleEmployment.be  This test is not yet approved or cleared by the [place] FDA and has been authorized for detection and/or diagnosis of SARS-CoV-2 by FDA under an Emergency Use Authorization (EUA). This EUA will remain in effect (meaning this test can be used) for the  duration of the COVID-19 declaration under Section 564(b)(1) of the Act, 21 U.S.C. section 360bbb-3(b)(1), unless the authorization is terminated or revoked.  Performed at [facility], [address], [place], [place] [address]      Scheduled Meds: . amLODipine  5 mg Oral Daily  . apixaban  2.5 mg Oral BID  . aspirin EC  81 mg Oral Q breakfast  . atorvastatin  20 mg Oral QHS  . feeding supplement  237 mL Oral BID  . furosemide  80 mg Intravenous BID  . hydrALAZINE  50 mg Oral BID  . insulin aspart  0-15 Units Subcutaneous TID WC  . insulin  aspart  0-5 Units Subcutaneous QHS  . isosorbide dinitrate  5 mg Oral BID  . loratadine  10 mg Oral Daily  . metoprolol succinate  25 mg Oral Daily  . sacubitril-valsartan  0.5 tablet Oral BID   Continuous Infusions:  Procedures/Studies: DG Chest Portable 1 View  Result Date: 11/20/2020 CLINICAL DATA:  Dyspnea, COVID infection a few months prior EXAM: PORTABLE CHEST 1 VIEW COMPARISON:  09/14/2020 chest radiograph. FINDINGS: Stable configuration of median sternotomy wires with chronic lower most sternotomy wire discontinuity. Stable cardiomediastinal silhouette with moderate cardiomegaly. No pneumothorax. No pleural effusion. Cephalization of the pulmonary vasculature without overt pulmonary edema. No acute consolidative airspace disease. IMPRESSION: Moderate cardiomegaly without overt pulmonary edema. No active pulmonary disease. Electronically Signed   By: Ilona Sorrel M.D.   On: 11/20/2020 19:01    [name], DO  [facility]  If 7PM-7AM, please contact night-coverage [email or web address] Password [email or web address] 11/22/2020, 1:37 PM   LOS: 1 day

## 2020-11-22 NOTE — Progress Notes (Signed)
Progress Note  Patient Name: Julia Hart Date of Encounter: 11/22/2020  [facility] Cardiologist: Dr Sabra Heck, Angelina Sheriff  Subjective   Some ongoing SOB  Inpatient Medications    Scheduled Meds: . amLODipine  5 mg Oral Daily  . apixaban  2.5 mg Oral BID  . aspirin EC  81 mg Oral Q breakfast  . atorvastatin  20 mg Oral QHS  . feeding supplement  237 mL Oral BID  . furosemide  60 mg Intravenous BID  . hydrALAZINE  50 mg Oral BID  . insulin aspart  0-15 Units Subcutaneous TID WC  . insulin aspart  0-5 Units Subcutaneous QHS  . isosorbide dinitrate  5 mg Oral BID  . metoprolol succinate  25 mg Oral Daily  . sacubitril-valsartan  0.5 tablet Oral BID   Continuous Infusions:  PRN Meds: acetaminophen **OR** acetaminophen, ondansetron **OR** ondansetron (ZOFRAN) IV, oxyCODONE, polyethylene glycol   Vital Signs    Vitals:   11/21/20 2027 11/22/20 0333 11/22/20 0700 11/22/20 0852  BP: (!) 142/60 (!) 150/46  (!) 149/71  Pulse: 70 (!) 55  (!) 58  Resp: 17 18    Temp: 98.4 F (36.9 C) 98 F (36.7 C)    TempSrc: Oral Oral    SpO2:  99%    Weight:   64.8 kg     Intake/Output Summary (Last 24 hours) at 11/22/2020 1105 Last data filed at 11/22/2020 0900 Gross per 24 hour  Intake 661.64 ml  Output 200 ml  Net 461.64 ml   Last 3 Weights 11/22/2020 11/21/2020 09/19/2020  Weight (lbs) 142 lb 14.4 oz 144 lb 14.4 oz 131 lb 2.8 oz  Weight (kg) 64.819 kg 65.726 kg 59.5 kg      Telemetry    SR, PACs, afib - Personally Reviewed  ECG    n/a - Personally Reviewed  Physical Exam   GEN: No acute distress.   Neck: elevated JVD Cardiac: irreg Respiratory: Clear to auscultation bilaterally. GI: Soft, nontender, non-distended  MS: 1-2+ bilateral LE edema; No deformity. Neuro:  Nonfocal  Psych: Normal affect   Labs    High Sensitivity Troponin:   Recent Labs  Lab 11/20/20 1840 11/20/20 2013 11/21/20 0213  TROPONINIHS 90* 101* 93*      Chemistry Recent Labs  Lab  11/20/20 1840 11/21/20 0213 11/21/20 1630  NA 132* 132* 133*  K 3.5 3.1* 4.0  CL 100 99 99  CO2 23 23 26   GLUCOSE 99 112* 100*  BUN 50* 49* 51*  CREATININE 1.74* 1.68* 1.78*  CALCIUM 8.7* 8.7* 8.8*  PROT 7.6 7.5  --   ALBUMIN 3.3* 3.4*  --   AST 27 24  --   ALT 12 10  --   ALKPHOS 76 73  --   BILITOT 0.7 0.9  --   GFRNONAA 27* 28* 26*  ANIONGAP 9 10 8      Hematology Recent Labs  Lab 11/20/20 1840 11/21/20 0213  WBC 3.1* 3.3*  RBC 3.30* 3.38*  HGB 9.4* 9.7*  HCT 28.8* 29.8*  MCV 87.3 88.2  MCH 28.5 28.7  MCHC 32.6 32.6  RDW 15.4 15.5  PLT 136* 128*    BNP Recent Labs  Lab 11/20/20 1840  BNP >4,500.0*     DDimer No results for input(s): DDIMER in the last 168 hours.   Radiology    DG Chest Portable 1 View  Result Date: 11/20/2020 CLINICAL DATA:  Dyspnea, COVID infection a few months prior EXAM: PORTABLE CHEST 1 VIEW COMPARISON:  09/14/2020 chest radiograph. FINDINGS: Stable configuration of median sternotomy wires with chronic lower most sternotomy wire discontinuity. Stable cardiomediastinal silhouette with moderate cardiomegaly. No pneumothorax. No pleural effusion. Cephalization of the pulmonary vasculature without overt pulmonary edema. No acute consolidative airspace disease. IMPRESSION: Moderate cardiomegaly without overt pulmonary edema. No active pulmonary disease. Electronically Signed   By: Ilona Sorrel M.D.   On: 11/20/2020 19:01    Cardiac Studies     Patient Profile     Julia Hart is a 87 y.o. female with a hx of combined systolic & diastolic HF, ischemic cardiomyopathy (now with recovered EF of 65% per echo on 08/04/20), ? Cardiac amyloidosis, CAD with CABG (2008), paroxysmal VT, paroxysmal atrial fibrillation, HTN, HL, osteoporosis, GERD, anemia and CKD, who is being seen 11/21/2020 for the evaluation of shortness of breath at the request of Shahmehdi, Valeria Batman, MD.  [place]    1. Acute on chronic combined/ diastolic HF - Jan 0017  echo LVE 65-70%, no WMAs, severe LVH, grade II dd, mild RV dysfunction - from last echo above LVEF has normalized, ongoing dd  - BNP >4500, CXR cardiomegaly without over edema - she is on IV lasix 60mg  bid,  I/Os are incomplete, weights would suggets down 2 lbs if accurate. Mild variations in Cr without clear trend.  - remains significanlty volume overload, try increasing IV lasix to 80mg  bid, accept higher Cr for now in order to diurese.    2. Coronary artery disease with CABG in 2008 and PCI of RCA 2019 - no chest pain, flat trops in setting of HF - no additional testing planned at this time.    3. Atrial fibrillation She is rate controlled - defer eliquis with ASA to primary outpatient cardilogist    For questions or updates, please contact [place] Please consult [email or web address] for contact info under        Signed, Carlyle Dolly, MD  11/22/2020, 11:05 AM

## 2020-11-22 NOTE — Progress Notes (Signed)
Physical Therapy Note  Patient Details  Name: Julia Hart MRN: [id number] Date of Birth: 27-Jan-1927 Today's Date: 11/22/2020    Pt refused treatment today due to no sleep and itching horribly.  Agrees to resume therapy tomorrow.  Teena Irani, PTA/CLT [phone]    Roseanne Reno B  11/22/2020, 4:38PM

## 2020-11-23 DIAGNOSIS — I5043 Acute on chronic combined systolic (congestive) and diastolic (congestive) heart failure: Secondary | ICD-10-CM | POA: Diagnosis not present

## 2020-11-23 DIAGNOSIS — E871 Hypo-osmolality and hyponatremia: Secondary | ICD-10-CM | POA: Diagnosis not present

## 2020-11-23 DIAGNOSIS — N1832 Chronic kidney disease, stage 3b: Secondary | ICD-10-CM | POA: Diagnosis not present

## 2020-11-23 DIAGNOSIS — R778 Other specified abnormalities of plasma proteins: Secondary | ICD-10-CM | POA: Diagnosis not present

## 2020-11-23 LAB — COMPREHENSIVE METABOLIC PANEL
ALT: 11 U/L (ref 0–44)
AST: 26 U/L (ref 15–41)
Albumin: 3.4 g/dL — ABNORMAL LOW (ref 3.5–5.0)
Alkaline Phosphatase: 65 U/L (ref 38–126)
Anion gap: 9 (ref 5–15)
BUN: 59 mg/dL — ABNORMAL HIGH (ref 8–23)
CO2: 27 mmol/L (ref 22–32)
Calcium: 8.9 mg/dL (ref 8.9–10.3)
Chloride: 100 mmol/L (ref 98–111)
Creatinine, Ser: 1.82 mg/dL — ABNORMAL HIGH (ref 0.44–1.00)
GFR, Estimated: 26 mL/min — ABNORMAL LOW (ref >60)
Glucose, Bld: 85 mg/dL (ref 70–99)
Potassium: 4.2 mmol/L (ref 3.5–5.1)
Sodium: 136 mmol/L (ref 135–145)
Total Bilirubin: 0.6 mg/dL (ref 0.3–1.2)
Total Protein: 6.9 g/dL (ref 6.5–8.1)

## 2020-11-23 LAB — CBC
HCT: 27.3 % — ABNORMAL LOW (ref 36.0–46.0)
Hemoglobin: 8.9 g/dL — ABNORMAL LOW (ref 12.0–15.0)
MCH: 28.8 pg (ref 26.0–34.0)
MCHC: 32.6 g/dL (ref 30.0–36.0)
MCV: 88.3 fL (ref 80.0–100.0)
Platelets: 114 10*3/uL — ABNORMAL LOW (ref 150–400)
RBC: 3.09 MIL/uL — ABNORMAL LOW (ref 3.87–5.11)
RDW: 15.3 % (ref 11.5–15.5)
WBC: 4 10*3/uL (ref 4.0–10.5)
nRBC: 0 % (ref 0.0–0.2)

## 2020-11-23 LAB — GLUCOSE, CAPILLARY
Glucose-Capillary: 85 mg/dL (ref 70–99)
Glucose-Capillary: 133 mg/dL — ABNORMAL HIGH (ref 70–99)
Glucose-Capillary: 96 mg/dL (ref 70–99)
Glucose-Capillary: 127 mg/dL — ABNORMAL HIGH (ref 70–99)

## 2020-11-23 LAB — BRAIN NATRIURETIC PEPTIDE: B Natriuretic Peptide: >4500 pg/mL — ABNORMAL HIGH (ref 0.0–100.0)

## 2020-11-23 LAB — MAGNESIUM: Magnesium: 2.2 mg/dL (ref 1.7–2.4)

## 2020-11-23 NOTE — Progress Notes (Signed)
Progress Note  Patient Name: Julia Hart Date of Encounter: 11/23/2020  [facility] Cardiologist: Dr Sabra Heck, Angelina Sheriff  Subjective   Some ongoing SOB. Has some diffuse body aches.  Inpatient Medications    Scheduled Meds: . amLODipine  5 mg Oral Daily  . apixaban  2.5 mg Oral BID  . aspirin EC  81 mg Oral Q breakfast  . atorvastatin  20 mg Oral QHS  . feeding supplement  237 mL Oral BID  . furosemide  80 mg Intravenous BID  . hydrALAZINE  50 mg Oral BID  . insulin aspart  0-15 Units Subcutaneous TID WC  . insulin aspart  0-5 Units Subcutaneous QHS  . isosorbide dinitrate  5 mg Oral BID  . loratadine  10 mg Oral Daily  . metoprolol succinate  25 mg Oral Daily  . sacubitril-valsartan  0.5 tablet Oral BID   Continuous Infusions:  PRN Meds: acetaminophen **OR** acetaminophen, camphor-menthol, ondansetron **OR** ondansetron (ZOFRAN) IV, oxyCODONE, polyethylene glycol   Vital Signs    Vitals:   11/23/20 0324 11/23/20 0500 11/23/20 0823 11/23/20 1313  BP: (!) 145/49  (!) 131/49 (!) 136/52  Pulse: (!) 58  (!) 56 (!) 52  Resp: 19   18  Temp: 97.7 F (36.5 C)   97.8 F (36.6 C)  TempSrc:    Oral  SpO2: 98%   100%  Weight:  64.9 kg      Intake/Output Summary (Last 24 hours) at 11/23/2020 1555 Last data filed at 11/23/2020 1300 Gross per 24 hour  Intake 200 ml  Output --  Net 200 ml   Last 3 Weights 11/23/2020 11/22/2020 11/21/2020  Weight (lbs) 143 lb 1.3 oz 142 lb 14.4 oz 144 lb 14.4 oz  Weight (kg) 64.9 kg 64.819 kg 65.726 kg      Telemetry    SR, PACs, afib - Personally Reviewed  ECG    n/a - Personally Reviewed  Physical Exam   GEN: No acute distress.   Neck: elevated JVD Cardiac: irreg Respiratory: Clear to auscultation bilaterally. GI: Soft, nontender, non-distended  MS: 1-2+ bilateral LE edema; No deformity. Neuro:  Nonfocal  Psych: Normal affect   Labs    High Sensitivity Troponin:   Recent Labs  Lab 11/20/20 1840 11/20/20 2013  11/21/20 0213  TROPONINIHS 90* 101* 93*      Chemistry Recent Labs  Lab 11/20/20 1840 11/21/20 0213 11/21/20 1630 11/23/20 0548  NA 132* 132* 133* 136  K 3.5 3.1* 4.0 4.2  CL 100 99 99 100  CO2 23 23 26 27   GLUCOSE 99 112* 100* 85  BUN 50* 49* 51* 59*  CREATININE 1.74* 1.68* 1.78* 1.82*  CALCIUM 8.7* 8.7* 8.8* 8.9  PROT 7.6 7.5  --  6.9  ALBUMIN 3.3* 3.4*  --  3.4*  AST 27 24  --  26  ALT 12 10  --  11  ALKPHOS 76 73  --  65  BILITOT 0.7 0.9  --  0.6  GFRNONAA 27* 28* 26* 26*  ANIONGAP 9 10 8 9      Hematology Recent Labs  Lab 11/20/20 1840 11/21/20 0213 11/23/20 0548  WBC 3.1* 3.3* 4.0  RBC 3.30* 3.38* 3.09*  HGB 9.4* 9.7* 8.9*  HCT 28.8* 29.8* 27.3*  MCV 87.3 88.2 88.3  MCH 28.5 28.7 28.8  MCHC 32.6 32.6 32.6  RDW 15.4 15.5 15.3  PLT 136* 128* 114*    BNP Recent Labs  Lab 11/20/20 1840 11/23/20 0548  BNP >4,500.0* >4,500.0*  DDimer No results for input(s): DDIMER in the last 168 hours.   Radiology    No results found.  Cardiac Studies     Patient Profile     Soo Steelman is a 87 y.o. female with a hx of combined systolic & diastolic HF, ischemic cardiomyopathy (now with recovered EF of 65% per echo on 08/04/20), ? Cardiac amyloidosis, CAD with CABG (2008), paroxysmal VT, paroxysmal atrial fibrillation, HTN, HL, osteoporosis, GERD, anemia and CKD, who is being seen 11/21/2020 for the evaluation of shortness of breath at the request of Shahmehdi, Valeria Batman, MD.  [place]    1. Acute on chronic diastolic HF Patient presented with worsening LE edema and weight gain despite using diuretics at home. Jan 2022 echo LVE 65-70%, no WMAs, severe LVH, grade II dd, mild RV dysfunction. BNP >4500, CXR cardiomegaly without over edema. She has had multiple admissions to the hospital this year with similar complaints. - Continue Entresto - Continue Toprol XL (monitor heart rate to avoid significant bradycardia) - Continue amlodipine - Continue  hydralazine and isordil - Continue lasix (received 80 mg IV earlier today). No accurate I and Os  Remains volume overloaded. Accept higher Cr for now in order to diurese with Lasix.   2. Coronary artery disease with CABG in 2008 and PCI of RCA 2019 - no chest pain, flat trops in setting of HF - no additional testing planned at this time.  - Continue ASA and atorvastatin   3. Atrial fibrillation She is rate controlled - defer eliquis with ASA to primary outpatient cardilogist   For questions or updates, please contact [place] Please consult [email or web address] for contact info under        Signed, Meade Maw, MD  11/23/2020, 3:55 PM

## 2020-11-23 NOTE — Progress Notes (Signed)
PROGRESS NOTE  Julia Hart WJX:[id number] DOB: 09-24-1926 DOA: 11/20/2020 PCP: [name], MD  Brief History:  87 year old female with a history of coronary artery disease/NSTEMI, hypertension, hyperlipidemia, ischemic cardiomyopathy with previous EF 35% presenting with 1 week history of shortness of breath, orthopnea, and worsening lower extremity edema.  She was started on IV lasix with good clinical improvement.  Cardiology was consulted to assist.  For her CKD 3, tolerance for worsening creatinine was allowed for improved euvolemia.   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 -09/04/863 echo EF 50% -08/05/20 Echo 65-70%, no WMA, mod enlarge RV, mod TR; small pericardial effusion -pt states dry weight ~120 lbs -Previously had echo with a EF of 35% in [place] -Continue IV furosemide--increase to 80 mg bid -Accurate I's and O's -Continue isosorbide, hydralazine -Continue Entresto for now  CKD stage 3b -baseline creatinine 1.3-1.6 -monitor with diuresis -will tolerated higher serum creatinine for euvolemia  Coronary artery disease -history of NSTEMI and PCI of the ostial and proximal RCA 08/27/2017 -No chest pain presently -08/27/2017 cath--RCA ostial 80% calcified stenosis;occluded SVG jump graft;first OM 70% stenosis;90% circumflex stenosis -Continue ASA and apixaban   Essential hypertension -Continue hydralazine and isosorbide -Holding amlodipine to allow margin for diuresis>>restart  Hyperlipidemia -Continue statin  GERD -Continue Protonix  Chronic atrial fibrillation -Rate controlled -Continue apixaban -continue metoprolol succinate  Hypokalemia -repleted -mag 1.8  Hyponatremia -due to CHF -continue diuresis  Arm/Back itch -no visible rash on exam -start loratidine-->improved -start sarna>>improved -pt states it's intermittent for past one month  Constipation -start miralax and senna    Status is:  Inpatient  Remains inpatient appropriate because:IV treatments appropriate due to intensity of illness or inability to take PO   Dispo: The patient is from: Home  Anticipated d/c is to: Home  Patient currently is not medically stable to d/c.              Difficult to place patient No        Family Communication:  niece updated at bedside 5/5  Consultants:  cardiology  Code Status:  DNR  DVT Prophylaxis:  apixaban   Procedures: As Listed in Progress Note Above  Antibiotics: None     Subjective: Patient still has some dyspnea on exertion.  Denies f/c, cp, sob, n/v/d  Objective: Vitals:   11/23/20 0324 11/23/20 0500 11/23/20 0823 11/23/20 1313  BP: (!) 145/49  (!) 131/49 (!) 136/52  Pulse: (!) 58  (!) 56 (!) 52  Resp: 19   18  Temp: 97.7 F (36.5 C)   97.8 F (36.6 C)  TempSrc:    Oral  SpO2: 98%   100%  Weight:  64.9 kg      Intake/Output Summary (Last 24 hours) at 11/23/2020 1723 Last data filed at 11/23/2020 1300 Gross per 24 hour  Intake 400 ml  Output --  Net 400 ml   Weight change: -0.826 kg Exam:   General:  Pt is alert, follows commands appropriately, not in acute distress  HEENT: No icterus, No thrush, No neck mass, [place]/AT  Cardiovascular: RRR, S1/S2, no rubs, no gallops  Respiratory: bibasilar rales. No wheeze  Abdomen: Soft/+BS, non tender, non distended, no guarding  Extremities: 1+ LE edema, No lymphangitis, No petechiae, No rashes, no synovitis   Data Reviewed: I have personally reviewed following labs and imaging studies Basic Metabolic Panel: Recent Labs  Lab 11/20/20 1840 11/21/20 0213 11/21/20 1630 11/23/20 0548  NA 132* 132*  133* 136  K 3.5 3.1* 4.0 4.2  CL 100 99 99 100  CO2 23 23 26 27   GLUCOSE 99 112* 100* 85  BUN 50* 49* 51* 59*  CREATININE 1.74* 1.68* 1.78* 1.82*  CALCIUM 8.7* 8.7* 8.8* 8.9  MG  --  1.8  --  2.2   Liver Function Tests: Recent Labs  Lab 11/20/20 1840  11/21/20 0213 11/23/20 0548  AST 27 24 26   ALT 12 10 11   ALKPHOS 76 73 65  BILITOT 0.7 0.9 0.6  PROT 7.6 7.5 6.9  ALBUMIN 3.3* 3.4* 3.4*   No results for input(s): LIPASE, AMYLASE in the last 168 hours. No results for input(s): AMMONIA in the last 168 hours. Coagulation Profile: No results for input(s): INR, PROTIME in the last 168 hours. CBC: Recent Labs  Lab 11/20/20 1840 11/21/20 0213 11/23/20 0548  WBC 3.1* 3.3* 4.0  NEUTROABS 1.4* 1.5*  --   HGB 9.4* 9.7* 8.9*  HCT 28.8* 29.8* 27.3*  MCV 87.3 88.2 88.3  PLT 136* 128* 114*   Cardiac Enzymes: No results for input(s): CKTOTAL, CKMB, CKMBINDEX, TROPONINI in the last 168 hours. BNP: Invalid input(s): POCBNP CBG: Recent Labs  Lab 11/22/20 1606 11/22/20 2128 11/23/20 0755 11/23/20 1106 11/23/20 1633  GLUCAP 122* 106* 85 133* 96   HbA1C: Recent Labs    11/21/20 0213  HGBA1C 5.8*   Urine analysis: No results found for: COLORURINE, APPEARANCEUR, LABSPEC, PHURINE, GLUCOSEU, HGBUR, BILIRUBINUR, KETONESUR, PROTEINUR, UROBILINOGEN, NITRITE, LEUKOCYTESUR Sepsis Labs: @LABRCNTIP (procalcitonin:4,lacticidven:4) ) Recent Results (from the past 240 hour(s))  Resp Panel by RT-PCR (Flu A&B, Covid) Nasopharyngeal Swab     Status: None   Collection Time: 11/20/20  6:21 PM   Specimen: Nasopharyngeal Swab; Nasopharyngeal(NP) swabs in vial transport medium  Result Value Ref Range Status   SARS Coronavirus 2 by RT PCR NEGATIVE NEGATIVE Final    Comment: (NOTE) SARS-CoV-2 target nucleic acids are NOT DETECTED.  The SARS-CoV-2 RNA is generally detectable in upper respiratory specimens during the acute phase of infection. The lowest concentration of SARS-CoV-2 viral copies this assay can detect is 138 copies/mL. A negative result does not preclude SARS-Cov-2 infection and should not be used as the sole basis for treatment or other patient management decisions. A negative result may occur with  improper specimen  collection/handling, submission of specimen other than nasopharyngeal swab, presence of viral mutation(s) within the areas targeted by this assay, and inadequate number of viral copies(<138 copies/mL). A negative result must be combined with clinical observations, patient history, and epidemiological information. The expected result is Negative.  Fact Sheet for Patients:  EntrepreneurPulse.com.au  Fact Sheet for Healthcare Providers:  IncredibleEmployment.be  This test is no t yet approved or cleared by the [place] FDA and  has been authorized for detection and/or diagnosis of SARS-CoV-2 by FDA under an Emergency Use Authorization (EUA). This EUA will remain  in effect (meaning this test can be used) for the duration of the COVID-19 declaration under Section 564(b)(1) of the Act, 21 U.S.C.section 360bbb-3(b)(1), unless the authorization is terminated  or revoked sooner.       Influenza A by PCR NEGATIVE NEGATIVE Final   Influenza B by PCR NEGATIVE NEGATIVE Final    Comment: (NOTE) The Xpert Xpress SARS-CoV-2/FLU/RSV plus assay is intended as an aid in the diagnosis of influenza from Nasopharyngeal swab specimens and should not be used as a sole basis for treatment. Nasal washings and aspirates are unacceptable for Xpert Xpress SARS-CoV-2/FLU/RSV testing.  Fact Sheet for  Patients: EntrepreneurPulse.com.au  Fact Sheet for Healthcare Providers: IncredibleEmployment.be  This test is not yet approved or cleared by the [place] FDA and has been authorized for detection and/or diagnosis of SARS-CoV-2 by FDA under an Emergency Use Authorization (EUA). This EUA will remain in effect (meaning this test can be used) for the duration of the COVID-19 declaration under Section 564(b)(1) of the Act, 21 U.S.C. section 360bbb-3(b)(1), unless the authorization is terminated or revoked.  Performed at [facility], [address] [place], [place] [address]      Scheduled Meds: . amLODipine  5 mg Oral Daily  . apixaban  2.5 mg Oral BID  . aspirin EC  81 mg Oral Q breakfast  . atorvastatin  20 mg Oral QHS  . feeding supplement  237 mL Oral BID  . furosemide  80 mg Intravenous BID  . hydrALAZINE  50 mg Oral BID  . insulin aspart  0-15 Units Subcutaneous TID WC  . insulin aspart  0-5 Units Subcutaneous QHS  . isosorbide dinitrate  5 mg Oral BID  . loratadine  10 mg Oral Daily  . metoprolol succinate  25 mg Oral Daily  . sacubitril-valsartan  0.5 tablet Oral BID   Continuous Infusions:  Procedures/Studies: DG Chest Portable 1 View  Result Date: 11/20/2020 CLINICAL DATA:  Dyspnea, COVID infection a few months prior EXAM: PORTABLE CHEST 1 VIEW COMPARISON:  09/14/2020 chest radiograph. FINDINGS: Stable configuration of median sternotomy wires with chronic lower most sternotomy wire discontinuity. Stable cardiomediastinal silhouette with moderate cardiomegaly. No pneumothorax. No pleural effusion. Cephalization of the pulmonary vasculature without overt pulmonary edema. No acute consolidative airspace disease. IMPRESSION: Moderate cardiomegaly without overt pulmonary edema. No active pulmonary disease. Electronically Signed   By: Ilona Sorrel M.D.   On: 11/20/2020 19:01    [name], DO  [facility]  If 7PM-7AM, please contact night-coverage [email or web address] Password [email or web address] 11/23/2020, 5:23 PM   LOS: 2 days

## 2020-11-24 ENCOUNTER — Inpatient Hospital Stay (HOSPITAL_COMMUNITY): Payer: Medicare Other

## 2020-11-24 ENCOUNTER — Other Ambulatory Visit (HOSPITAL_COMMUNITY): Payer: Medicare Other

## 2020-11-24 DIAGNOSIS — E871 Hypo-osmolality and hyponatremia: Secondary | ICD-10-CM | POA: Diagnosis not present

## 2020-11-24 DIAGNOSIS — N179 Acute kidney failure, unspecified: Secondary | ICD-10-CM | POA: Diagnosis not present

## 2020-11-24 DIAGNOSIS — I5043 Acute on chronic combined systolic (congestive) and diastolic (congestive) heart failure: Secondary | ICD-10-CM | POA: Diagnosis not present

## 2020-11-24 DIAGNOSIS — N1832 Chronic kidney disease, stage 3b: Secondary | ICD-10-CM | POA: Diagnosis not present

## 2020-11-24 LAB — BASIC METABOLIC PANEL
Anion gap: 8 (ref 5–15)
BUN: 64 mg/dL — ABNORMAL HIGH (ref 8–23)
CO2: 26 mmol/L (ref 22–32)
Calcium: 8.7 mg/dL — ABNORMAL LOW (ref 8.9–10.3)
Chloride: 101 mmol/L (ref 98–111)
Creatinine, Ser: 1.99 mg/dL — ABNORMAL HIGH (ref 0.44–1.00)
GFR, Estimated: 23 mL/min — ABNORMAL LOW (ref >60)
Glucose, Bld: 90 mg/dL (ref 70–99)
Potassium: 4.2 mmol/L (ref 3.5–5.1)
Sodium: 135 mmol/L (ref 135–145)

## 2020-11-24 LAB — GLUCOSE, CAPILLARY
Glucose-Capillary: 88 mg/dL (ref 70–99)
Glucose-Capillary: 105 mg/dL — ABNORMAL HIGH (ref 70–99)
Glucose-Capillary: 107 mg/dL — ABNORMAL HIGH (ref 70–99)
Glucose-Capillary: 89 mg/dL (ref 70–99)

## 2020-11-24 MED ORDER — IOHEXOL 9 MG/ML PO SOLN
ORAL | Status: AC
  Filled 2020-11-24: qty 1000

## 2020-11-24 NOTE — Plan of Care (Signed)

## 2020-11-24 NOTE — Progress Notes (Signed)
PROGRESS NOTE  Julia Hart VPX:[id number] DOB: 08-18-1926 DOA: 11/20/2020 PCP: [name], MD  Brief History: 87 year old female with a history of coronary artery disease/NSTEMI, hypertension, hyperlipidemia, ischemic cardiomyopathy with previous EF 35% presenting with 1 week history of shortness of breath, orthopnea,and worsening lower extremity edema.  She was started on IV lasix with good clinical improvement.  Cardiology was consulted to assist.  For her CKD 3, tolerance for worsening creatinine was allowed for improved euvolemia.   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 -4/62/7035 echo EF 50% -08/05/20 Echo 65-70%, no WMA, mod enlarge RV, mod TR; small pericardial effusion -pt states dry weight ~120 lbs -Previously had echo with a EF of 35%in [place] -Continue IV furosemide--increase to 80 mg bid -Accurate I's and O's -Continue isosorbide, hydralazine -Continue Entresto for now  CKD stage 3b -baseline creatinine 1.3-1.6 -monitor with diuresis -will tolerated higher serum creatinine for euvolemia  Coronary artery disease -history of NSTEMI and PCI of the ostial and proximal RCA 08/27/2017 -No chest pain presently -08/27/2017 cath--RCA ostial 80% calcified stenosis;occluded SVG jump graft;first OM 70% stenosis;90% circumflex stenosis -Continue ASA and apixaban   Essential hypertension -Continue hydralazine and isosorbide -continue amlodipine and metoprolol succinate  Hyperlipidemia -Continue statin  GERD -Continue Protonix  Chronic atrial fibrillation -Rate controlled -Continue apixaban -continue metoprolol succinate  Hypokalemia -repleted -mag 1.8  Hyponatremia -due to CHF -continue diuresis  Arm/Back itch -no visible rash on exam -start loratidine-->improved -start sarna>>improved -pt states it's intermittent for past one month  Abdominal Pain -CT abd/pelvis    Status is:  Inpatient  Remains inpatient appropriate because:IV treatments appropriate due to intensity of illness or inability to take PO   Dispo: The patient is from:Home Anticipated d/c is KK:[facility] Patient currently is not medically stable to d/c. Difficult to place patient No        Family Communication:niece updated at bedside 5/5  Consultants:cardiology  Code Status: DNR  DVT Prophylaxis:apixaban   Procedures: As Listed in Progress Note Above  Antibiotics: None     Subjective: Patient complains of right sided abd pain.  Denies f/c, cp, sob, n/v/d.  Objective: Vitals:   11/23/20 1313 11/24/20 0532 11/24/20 1039 11/24/20 1431  BP: (!) 136/52 (!) 110/50  (!) 145/62  Pulse: (!) 52 66  85  Resp: 18 19  20   Temp: 97.8 F (36.6 C) 98.3 F (36.8 C)  97.8 F (36.6 C)  TempSrc: Oral   Oral  SpO2: 100% 97%  100%  Weight:   64.3 kg     Intake/Output Summary (Last 24 hours) at 11/24/2020 1655 Last data filed at 11/24/2020 1251 Gross per 24 hour  Intake 680 ml  Output --  Net 680 ml   Weight change:  Exam:   General:  Pt is alert, follows commands appropriately, not in acute distress  HEENT: No icterus, No thrush, No neck mass, [place]/AT  Cardiovascular: RRR, S1/S2, no rubs, no gallops  Respiratory: bibasilar  Crackles no wheeze  Abdomen: Soft/+BS, non tender, non distended, no guarding  Extremities: 1+ LE edema, No lymphangitis, No petechiae, No rashes, no synovitis   Data Reviewed: I have personally reviewed following labs and imaging studies Basic Metabolic Panel: Recent Labs  Lab 11/20/20 1840 11/21/20 0213 11/21/20 1630 11/23/20 0548 11/24/20 0602  NA 132* 132* 133* 136 135  K 3.5 3.1* 4.0 4.2 4.2  CL 100 99 99 100 101  CO2 23 23 26 27 26   GLUCOSE 99 112*  100* 85 90  BUN 50* 49* 51* 59* 64*  CREATININE 1.74* 1.68* 1.78* 1.82* 1.99*  CALCIUM 8.7* 8.7* 8.8* 8.9 8.7*  MG  --  1.8  --   2.2  --    Liver Function Tests: Recent Labs  Lab 11/20/20 1840 11/21/20 0213 11/23/20 0548  AST 27 24 26   ALT 12 10 11   ALKPHOS 76 73 65  BILITOT 0.7 0.9 0.6  PROT 7.6 7.5 6.9  ALBUMIN 3.3* 3.4* 3.4*   No results for input(s): LIPASE, AMYLASE in the last 168 hours. No results for input(s): AMMONIA in the last 168 hours. Coagulation Profile: No results for input(s): INR, PROTIME in the last 168 hours. CBC: Recent Labs  Lab 11/20/20 1840 11/21/20 0213 11/23/20 0548  WBC 3.1* 3.3* 4.0  NEUTROABS 1.4* 1.5*  --   HGB 9.4* 9.7* 8.9*  HCT 28.8* 29.8* 27.3*  MCV 87.3 88.2 88.3  PLT 136* 128* 114*   Cardiac Enzymes: No results for input(s): CKTOTAL, CKMB, CKMBINDEX, TROPONINI in the last 168 hours. BNP: Invalid input(s): POCBNP CBG: Recent Labs  Lab 11/23/20 1633 11/23/20 2029 11/24/20 0743 11/24/20 1115 11/24/20 1619  GLUCAP 96 127* 88 105* 107*   HbA1C: No results for input(s): HGBA1C in the last 72 hours. Urine analysis: No results found for: COLORURINE, APPEARANCEUR, LABSPEC, PHURINE, GLUCOSEU, HGBUR, BILIRUBINUR, KETONESUR, PROTEINUR, UROBILINOGEN, NITRITE, LEUKOCYTESUR Sepsis Labs: @LABRCNTIP (procalcitonin:4,lacticidven:4) ) Recent Results (from the past 240 hour(s))  Resp Panel by RT-PCR (Flu A&B, Covid) Nasopharyngeal Swab     Status: None   Collection Time: 11/20/20  6:21 PM   Specimen: Nasopharyngeal Swab; Nasopharyngeal(NP) swabs in vial transport medium  Result Value Ref Range Status   SARS Coronavirus 2 by RT PCR NEGATIVE NEGATIVE Final    Comment: (NOTE) SARS-CoV-2 target nucleic acids are NOT DETECTED.  The SARS-CoV-2 RNA is generally detectable in upper respiratory specimens during the acute phase of infection. The lowest concentration of SARS-CoV-2 viral copies this assay can detect is 138 copies/mL. A negative result does not preclude SARS-Cov-2 infection and should not be used as the sole basis for treatment or other patient management  decisions. A negative result may occur with  improper specimen collection/handling, submission of specimen other than nasopharyngeal swab, presence of viral mutation(s) within the areas targeted by this assay, and inadequate number of viral copies(<138 copies/mL). A negative result must be combined with clinical observations, patient history, and epidemiological information. The expected result is Negative.  Fact Sheet for Patients:  EntrepreneurPulse.com.au  Fact Sheet for Healthcare Providers:  IncredibleEmployment.be  This test is no t yet approved or cleared by the [place] FDA and  has been authorized for detection and/or diagnosis of SARS-CoV-2 by FDA under an Emergency Use Authorization (EUA). This EUA will remain  in effect (meaning this test can be used) for the duration of the COVID-19 declaration under Section 564(b)(1) of the Act, 21 U.S.C.section 360bbb-3(b)(1), unless the authorization is terminated  or revoked sooner.       Influenza A by PCR NEGATIVE NEGATIVE Final   Influenza B by PCR NEGATIVE NEGATIVE Final    Comment: (NOTE) The Xpert Xpress SARS-CoV-2/FLU/RSV plus assay is intended as an aid in the diagnosis of influenza from Nasopharyngeal swab specimens and should not be used as a sole basis for treatment. Nasal washings and aspirates are unacceptable for Xpert Xpress SARS-CoV-2/FLU/RSV testing.  Fact Sheet for Patients: EntrepreneurPulse.com.au  Fact Sheet for Healthcare Providers: IncredibleEmployment.be  This test is not yet approved or cleared by  the [employer] and has been authorized for detection and/or diagnosis of SARS-CoV-2 by FDA under an Emergency Use Authorization (EUA). This EUA will remain in effect (meaning this test can be used) for the duration of the COVID-19 declaration under Section 564(b)(1) of the Act, 21 U.S.C. section 360bbb-3(b)(1), unless the  authorization is terminated or revoked.  Performed at [facility], [address] [place], [place] [address]      Scheduled Meds: . amLODipine  5 mg Oral Daily  . apixaban  2.5 mg Oral BID  . aspirin EC  81 mg Oral Q breakfast  . atorvastatin  20 mg Oral QHS  . feeding supplement  237 mL Oral BID  . furosemide  80 mg Intravenous BID  . hydrALAZINE  50 mg Oral BID  . insulin aspart  0-15 Units Subcutaneous TID WC  . insulin aspart  0-5 Units Subcutaneous QHS  . iohexol      . isosorbide dinitrate  5 mg Oral BID  . loratadine  10 mg Oral Daily  . metoprolol succinate  25 mg Oral Daily  . sacubitril-valsartan  0.5 tablet Oral BID   Continuous Infusions:  Procedures/Studies: DG Chest Portable 1 View  Result Date: 11/20/2020 CLINICAL DATA:  Dyspnea, COVID infection a few months prior EXAM: PORTABLE CHEST 1 VIEW COMPARISON:  09/14/2020 chest radiograph. FINDINGS: Stable configuration of median sternotomy wires with chronic lower most sternotomy wire discontinuity. Stable cardiomediastinal silhouette with moderate cardiomegaly. No pneumothorax. No pleural effusion. Cephalization of the pulmonary vasculature without overt pulmonary edema. No acute consolidative airspace disease. IMPRESSION: Moderate cardiomegaly without overt pulmonary edema. No active pulmonary disease. Electronically Signed   By: Ilona Sorrel M.D.   On: 11/20/2020 19:01    [name], DO  [facility]  If 7PM-7AM, please contact night-coverage [email or web address] Password [email or web address] 11/24/2020, 4:55 PM   LOS: 3 days

## 2020-11-24 NOTE — Progress Notes (Signed)
Progress Note  Patient Name: Julia Hart Date of Encounter: 11/24/2020  [facility] Cardiologist: Dr Sabra Heck, Angelina Sheriff  Subjective   SOB improving  Inpatient Medications    Scheduled Meds: . amLODipine  5 mg Oral Daily  . apixaban  2.5 mg Oral BID  . aspirin EC  81 mg Oral Q breakfast  . atorvastatin  20 mg Oral QHS  . feeding supplement  237 mL Oral BID  . furosemide  80 mg Intravenous BID  . hydrALAZINE  50 mg Oral BID  . insulin aspart  0-15 Units Subcutaneous TID WC  . insulin aspart  0-5 Units Subcutaneous QHS  . isosorbide dinitrate  5 mg Oral BID  . loratadine  10 mg Oral Daily  . metoprolol succinate  25 mg Oral Daily  . sacubitril-valsartan  0.5 tablet Oral BID   Continuous Infusions:  PRN Meds: acetaminophen **OR** acetaminophen, camphor-menthol, ondansetron **OR** ondansetron (ZOFRAN) IV, oxyCODONE, polyethylene glycol   Vital Signs    Vitals:   11/23/20 0500 11/23/20 0823 11/23/20 1313 11/24/20 0532  BP:  (!) 131/49 (!) 136/52 (!) 110/50  Pulse:  (!) 56 (!) 52 66  Resp:   18 19  Temp:   97.8 F (36.6 C) 98.3 F (36.8 C)  TempSrc:   Oral   SpO2:   100% 97%  Weight: 64.9 kg       Intake/Output Summary (Last 24 hours) at 11/24/2020 0951 Last data filed at 11/24/2020 0900 Gross per 24 hour  Intake 640 ml  Output --  Net 640 ml   Last 3 Weights 11/23/2020 11/22/2020 11/21/2020  Weight (lbs) 143 lb 1.3 oz 142 lb 14.4 oz 144 lb 14.4 oz  Weight (kg) 64.9 kg 64.819 kg 65.726 kg      Telemetry    SR, PACs- Personally Reviewed  ECG    n/a - Personally Reviewed  Physical Exam   GEN: No acute distress.   Neck: elevated JVD Cardiac: RRR, no murmurs, rubs, or gallops.  Respiratory: Clear to auscultation bilaterally. GI: Soft, nontender, non-distended  MS: 1+ bilateral LE edema; No deformity. Neuro:  Nonfocal  Psych: Normal affect   Labs    High Sensitivity Troponin:   Recent Labs  Lab 11/20/20 1840 11/20/20 2013 11/21/20 0213   TROPONINIHS 90* 101* 93*      Chemistry Recent Labs  Lab 11/20/20 1840 11/21/20 0213 11/21/20 1630 11/23/20 0548 11/24/20 0602  NA 132* 132* 133* 136 135  K 3.5 3.1* 4.0 4.2 4.2  CL 100 99 99 100 101  CO2 23 23 26 27 26   GLUCOSE 99 112* 100* 85 90  BUN 50* 49* 51* 59* 64*  CREATININE 1.74* 1.68* 1.78* 1.82* 1.99*  CALCIUM 8.7* 8.7* 8.8* 8.9 8.7*  PROT 7.6 7.5  --  6.9  --   ALBUMIN 3.3* 3.4*  --  3.4*  --   AST 27 24  --  26  --   ALT 12 10  --  11  --   ALKPHOS 76 73  --  65  --   BILITOT 0.7 0.9  --  0.6  --   GFRNONAA 27* 28* 26* 26* 23*  ANIONGAP 9 10 8 9 8      Hematology Recent Labs  Lab 11/20/20 1840 11/21/20 0213 11/23/20 0548  WBC 3.1* 3.3* 4.0  RBC 3.30* 3.38* 3.09*  HGB 9.4* 9.7* 8.9*  HCT 28.8* 29.8* 27.3*  MCV 87.3 88.2 88.3  MCH 28.5 28.7 28.8  MCHC 32.6 32.6 32.6  RDW 15.4 15.5 15.3  PLT 136* 128* 114*    BNP Recent Labs  Lab 11/20/20 1840 11/23/20 0548  BNP >4,500.0* >4,500.0*     DDimer No results for input(s): DDIMER in the last 168 hours.   Radiology    No results found.  Cardiac Studies     Patient Profile     Sujey Gundry a 87 y.o.femalewith a hx of combined systolic & diastolic HF, ischemic cardiomyopathy (now with recovered EF of 65% per echo on 08/04/20), ? Cardiac amyloidosis, CAD with CABG (2008), paroxysmal VT, paroxysmal atrial fibrillation, HTN, HL, osteoporosis, GERD, anemia and CKD,who is being seen5/3/2022for the evaluation of shortness of breathat the request of Shahmehdi, Valeria Batman, MD.  [place]    1. Acute on chronic combined/ diastolic HF - Jan 8295 echo LVE 65-70%, no WMAs, severe LVH, grade II dd, mild RV dysfunction - from last echo above LVEF has normalized, ongoing dd - BNP >4500, CXR cardiomegaly without over edema on admission - she is on IV lasix 80mg  bid, I/Os incomplete. Weight pending today.Uptrending Cr.  BNP yesterday still >4500.   - Very difficult to assess diuresis  progress given incomplete data. Her initial CXR without significant edema so little utility in repeating. Remains fluid overloaded by exam. With her markedly elevated BNP and uptrending Cr with diuresis makes me question if could be change in cardaic function since her Jan 2022 echo which showed hyeprdyanamic LV and grade II dd. Repeat echo.  - obtain standing weight. Weight at 09/19/20 discharge was 131 lbs - continue IV lasix today, tolerate higher Cr at this time.   2. Coronary artery disease with CABG in 2008 and PCI of RCA 2019 - no chest pain, flat trops in setting of HF - no additional testing planned at this time.    3. Atrial fibrillation She is rate controlled - defer eliquis with ASA to primary outpatient cardilogist  For questions or updates, please contact [place] Please consult [email or web address] for contact info under        Signed, Carlyle Dolly, MD  11/24/2020, 9:51 AM

## 2020-11-24 NOTE — Care Management Important Message (Signed)
Important Message  Patient Details  Name: Julia Hart MRN: [id number] Date of Birth: [birth year]   Medicare Important Message Given:  Yes     Tommy Medal 11/24/2020, 11:31 AM

## 2020-11-24 NOTE — Progress Notes (Signed)
Physical Therapy Treatment Patient Details Name: Julia Hart MRN: [id number] DOB: 1926-10-08 Today's Date: 11/24/2020    History of Present Illness Julia Hart is a 87 y.o. female presents with c/o dyspnea, BLE swelling, orthopnea. PMH: HTN, CAD, CHF, CABG 2008    PT Comments    Patient agreeable to participating in physical therapy session today although she states her stomach has been hurting her. Patient able to perform transfers and ambulation with RW with supervision although patient did require verbal cues to walk within the RW base of support and to transfer within RW base support all the way to the seat. Patient seated in recliner at end of session and meal tray within easy access. Patient would continue to benefit from skilled physical therapy to address "Problem List" below, reduce impairment and return to prior level of function.     Follow Up Recommendations  Home health PT;Supervision - Intermittent     Equipment Recommendations  None recommended by PT    Recommendations for Other Services       Precautions / Restrictions Precautions Precautions: Fall Restrictions Weight Bearing Restrictions: No    Mobility  Bed Mobility               General bed mobility comments: patient sitting in recliner at beginning and end of session    Transfers Overall transfer level: Needs assistance Equipment used: Rolling walker (2 wheeled) Transfers: Sit to/from [employer] Transfers Sit to Stand: Supervision Stand pivot transfers: Supervision       General transfer comment: cues for sequencing of steps and placement of hands; to stay within base of support until seat in chair  Ambulation/Gait Ambulation/Gait assistance: Supervision Gait Distance (Feet): 500 Feet Assistive device: Rolling walker (2 wheeled) Gait Pattern/deviations: Step-through pattern;Decreased step length - right;Decreased step length - left;Decreased stride length;Trunk flexed      General Gait Details: steady pace, cues to walk within base of support, on room air; limited by fatigue   Stairs             Wheelchair Mobility    Modified Rankin (Stroke Patients Only)       Balance Overall balance assessment: Needs assistance   Sitting balance-Leahy Scale: Good Sitting balance - Comments: seated EOB   Standing balance support: During functional activity Standing balance-Leahy Scale: Fair Standing balance comment: good with RW      Cognition Arousal/Alertness: Awake/alert Behavior During Therapy: WFL for tasks assessed/performed Overall Cognitive Status: Within Functional Limits for tasks assessed        Exercises      General Comments        Pertinent Vitals/Pain Pain Assessment: 0-10 Pain Score: 4  Pain Location: stomach Pain Intervention(s): Limited activity within patient's tolerance;Monitored during session;Repositioned    Home Living        Prior Function            PT Goals (current goals can now be found in the care plan section) Acute Rehab PT Goals Patient Stated Goal: "walk" PT Goal Formulation: With patient Time For Goal Achievement: 12/05/20 Potential to Achieve Goals: Good Progress towards PT goals: Progressing toward goals    Frequency    Min 3X/week      PT Plan Current plan remains appropriate       AM-PAC PT "6 Clicks" Mobility   Outcome Measure  Help needed turning from your back to your side while in a flat bed without using bedrails?: None Help needed moving from lying on your  back to sitting on the side of a flat bed without using bedrails?: None Help needed moving to and from a bed to a chair (including a wheelchair)?: None Help needed standing up from a chair using your arms (e.g., wheelchair or bedside chair)?: None Help needed to walk in hospital room?: A Little Help needed climbing 3-5 steps with a railing? : A Little 6 Click Score: 22    End of Session   Activity Tolerance:  Patient tolerated treatment well;Patient limited by fatigue Patient left: in chair;with call bell/phone within reach Nurse Communication: Mobility status PT Visit Diagnosis: Other abnormalities of gait and mobility (R26.89)     Time: 3810-1751 PT Time Calculation (min) (ACUTE ONLY): 15 min  Charges:  $Gait Training: 8-22 mins                     Floria Raveling. Hartnett-Rands, MS, PT Per [place] [phone]  Pamala Hurry  Hartnett-Rands 11/24/2020, 2:29 PM

## 2020-11-25 ENCOUNTER — Inpatient Hospital Stay (HOSPITAL_COMMUNITY): Payer: Medicare Other

## 2020-11-25 DIAGNOSIS — N1832 Chronic kidney disease, stage 3b: Secondary | ICD-10-CM | POA: Diagnosis not present

## 2020-11-25 DIAGNOSIS — I5043 Acute on chronic combined systolic (congestive) and diastolic (congestive) heart failure: Secondary | ICD-10-CM | POA: Diagnosis not present

## 2020-11-25 DIAGNOSIS — R0609 Other forms of dyspnea: Secondary | ICD-10-CM

## 2020-11-25 DIAGNOSIS — E871 Hypo-osmolality and hyponatremia: Secondary | ICD-10-CM | POA: Diagnosis not present

## 2020-11-25 LAB — CBC
HCT: 26.1 % — ABNORMAL LOW (ref 36.0–46.0)
Hemoglobin: 8.5 g/dL — ABNORMAL LOW (ref 12.0–15.0)
MCH: 28.1 pg (ref 26.0–34.0)
MCHC: 32.6 g/dL (ref 30.0–36.0)
MCV: 86.4 fL (ref 80.0–100.0)
Platelets: 103 10*3/uL — ABNORMAL LOW (ref 150–400)
RBC: 3.02 MIL/uL — ABNORMAL LOW (ref 3.87–5.11)
RDW: 15.5 % (ref 11.5–15.5)
WBC: 3.7 10*3/uL — ABNORMAL LOW (ref 4.0–10.5)
nRBC: 0 % (ref 0.0–0.2)

## 2020-11-25 LAB — MAGNESIUM: Magnesium: 2.1 mg/dL (ref 1.7–2.4)

## 2020-11-25 LAB — BASIC METABOLIC PANEL
Anion gap: 10 (ref 5–15)
BUN: 70 mg/dL — ABNORMAL HIGH (ref 8–23)
CO2: 26 mmol/L (ref 22–32)
Calcium: 8.8 mg/dL — ABNORMAL LOW (ref 8.9–10.3)
Chloride: 98 mmol/L (ref 98–111)
Creatinine, Ser: 1.83 mg/dL — ABNORMAL HIGH (ref 0.44–1.00)
GFR, Estimated: 26 mL/min — ABNORMAL LOW (ref >60)
Glucose, Bld: 77 mg/dL (ref 70–99)
Potassium: 4.1 mmol/L (ref 3.5–5.1)
Sodium: 134 mmol/L — ABNORMAL LOW (ref 135–145)

## 2020-11-25 LAB — ECHOCARDIOGRAM COMPLETE
AR max vel: 1.1 cm2
AV Area VTI: 1.15 cm2
AV Area mean vel: 1.1 cm2
AV Mean grad: 12.8 mmHg
AV Peak grad: 24 mmHg
Ao pk vel: 2.45 m/s
Area-P 1/2: 3.66 cm2
P 1/2 time: 341 msec
S' Lateral: 2.73 cm
Weight: 2292.8 oz

## 2020-11-25 LAB — GLUCOSE, CAPILLARY
Glucose-Capillary: 83 mg/dL (ref 70–99)
Glucose-Capillary: 122 mg/dL — ABNORMAL HIGH (ref 70–99)
Glucose-Capillary: 104 mg/dL — ABNORMAL HIGH (ref 70–99)
Glucose-Capillary: 101 mg/dL — ABNORMAL HIGH (ref 70–99)

## 2020-11-25 MED ORDER — SENNA 8.6 MG PO TABS
1.0000 | ORAL_TABLET | Freq: Every day | ORAL | Status: DC
  Administered 2020-11-25 – 2020-11-27 (×3): 8.6 mg via ORAL
  Filled 2020-11-25 (×3): qty 1

## 2020-11-25 MED ORDER — POLYETHYLENE GLYCOL 3350 17 G PO PACK
17.0000 g | PACK | Freq: Every day | ORAL | Status: DC
  Administered 2020-11-25 – 2020-11-29 (×5): 17 g via ORAL
  Filled 2020-11-25 (×5): qty 1

## 2020-11-25 NOTE — Progress Notes (Signed)
PROGRESS NOTE  Julia Hart [id number] DOB: July 20, 1927 DOA: 11/20/2020 PCP: [name], MD  Brief History: 87 year old female with a history of coronary artery disease/NSTEMI, hypertension, hyperlipidemia, ischemic cardiomyopathy with previous EF 35% presenting with 1 week history of shortness of breath, orthopnea,and worsening lower extremity edema.She was started on IV lasix with good clinical improvement. Cardiology was consulted to assist. For her CKD 3, tolerance for worsening creatinine was allowed for improved euvolemia.   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 -99991111 echo EF 50% -08/05/20 Echo 65-70%, no WMA, mod enlarge RV, mod TR; small pericardial effusion -11/25/20 Echo--EF 55%, severe reduced RV function, severe TR -pt states dry weight ~136 lbs -Previously had echo with a EF of 35%in [place] -Continue IV furosemide--increase to 80 mg bid -Accurate I's and O's -Continue isosorbide, hydralazine -Continue Entresto for now  CKD stage 3b -baseline creatinine 1.3-1.6 -monitor with diuresis -will tolerated higher serum creatinine for euvolemia  Coronary artery disease -history of NSTEMI and PCI of the ostial and proximal RCA 08/27/2017 -No chest pain presently -08/27/2017 cath--RCA ostial 80% calcified stenosis;occluded SVG jump graft;first OM 70% stenosis;90% circumflex stenosis -ContinueASAand apixaban   Essential hypertension -Continue hydralazine and isosorbide -continue amlodipine and metoprolol succinate  Hyperlipidemia -Continue statin  GERD -Continue Protonix  Chronic atrial fibrillation -Rate controlled -Continue apixaban -continue metoprolol succinate  Hypokalemia -repleted -mag 1.8  Hyponatremia -due to CHF -continue diuresis  Arm/Back itch -no visible rash on exam -start loratidine-->improved -start sarna>>improved -pt states it's intermittent for past one month  Abdominal  Pain -CT abd/pelvis--Anasarca with ascites and generalized soft tissue edema. No focal fluid collections or pleural effusions identified;  No focal hepatic abnormalities;  Distal colonic diverticulosis without bowel obstruction or focal inflammation.    Status is: Inpatient  Remains inpatient appropriate because:IV treatments appropriate due to intensity of illness or inability to take PO   Dispo: The patient is from:Home Anticipated d/c is [id number] Patient currently is not medically stable to d/c. Difficult to place patient No        Family Communication:niece updatedat bedside 5/5  Consultants:cardiology  Code Status: DNR  DVT Prophylaxis:apixaban   Procedures: As Listed in Progress Note Above  Antibiotics: None    Subjective: Patient had BM in last 24 hours with some improvement in abd pain.  Denies cp, n/v/d, f/c.  Has some dyspnea on exertion  Objective: Vitals:   11/24/20 1431 11/24/20 2136 11/25/20 0528 11/25/20 0600  BP: (!) 145/62 (!) 126/43 (!) 134/45   Pulse: 85 (!) 51 68   Resp: 20 14 14    Temp: 97.8 F (36.6 C) 98.7 F (37.1 C) 98.2 F (36.8 C)   TempSrc: Oral  Oral   SpO2: 100% 100% 98%   Weight:    65 kg    Intake/Output Summary (Last 24 hours) at 11/25/2020 1613 Last data filed at 11/25/2020 1300 Gross per 24 hour  Intake 480 ml  Output --  Net 480 ml   Weight change:  Exam:   General:  Pt is alert, follows commands appropriately, not in acute distress  HEENT: No icterus, No thrush, No neck mass, [place]/AT  Cardiovascular: RRR, S1/S2, no rubs, no gallops  Respiratory: bibasilar rales. No wheeze  Abdomen: Soft/+BS, non tender, non distended, no guarding  Extremities: 1+ LE edema, No lymphangitis, No petechiae, No rashes, no synovitis   Data Reviewed: I have personally reviewed following labs and imaging studies Basic Metabolic Panel: Recent Labs  Lab  11/21/20 0213 11/21/20 1630 11/23/20 0548 11/24/20 0602 11/25/20 0528  NA 132* 133* 136 135 134*  K 3.1* 4.0 4.2 4.2 4.1  CL 99 99 100 101 98  CO2 23 26 27 26 26   GLUCOSE 112* 100* 85 90 77  BUN 49* 51* 59* 64* 70*  CREATININE 1.68* 1.78* 1.82* 1.99* 1.83*  CALCIUM 8.7* 8.8* 8.9 8.7* 8.8*  MG 1.8  --  2.2  --  2.1   Liver Function Tests: Recent Labs  Lab 11/20/20 1840 11/21/20 0213 11/23/20 0548  AST 27 24 26   ALT 12 10 11   ALKPHOS 76 73 65  BILITOT 0.7 0.9 0.6  PROT 7.6 7.5 6.9  ALBUMIN 3.3* 3.4* 3.4*   No results for input(s): LIPASE, AMYLASE in the last 168 hours. No results for input(s): AMMONIA in the last 168 hours. Coagulation Profile: No results for input(s): INR, PROTIME in the last 168 hours. CBC: Recent Labs  Lab 11/20/20 1840 11/21/20 0213 11/23/20 0548 11/25/20 0528  WBC 3.1* 3.3* 4.0 3.7*  NEUTROABS 1.4* 1.5*  --   --   HGB 9.4* 9.7* 8.9* 8.5*  HCT 28.8* 29.8* 27.3* 26.1*  MCV 87.3 88.2 88.3 86.4  PLT 136* 128* 114* 103*   Cardiac Enzymes: No results for input(s): CKTOTAL, CKMB, CKMBINDEX, TROPONINI in the last 168 hours. BNP: Invalid input(s): POCBNP CBG: Recent Labs  Lab 11/24/20 1115 11/24/20 1619 11/24/20 2131 11/25/20 0729 11/25/20 1105  GLUCAP 105* 107* 89 83 122*   HbA1C: No results for input(s): HGBA1C in the last 72 hours. Urine analysis: No results found for: COLORURINE, APPEARANCEUR, LABSPEC, PHURINE, GLUCOSEU, HGBUR, BILIRUBINUR, KETONESUR, PROTEINUR, UROBILINOGEN, NITRITE, LEUKOCYTESUR Sepsis Labs: @LABRCNTIP (procalcitonin:4,lacticidven:4) ) Recent Results (from the past 240 hour(s))  Resp Panel by RT-PCR (Flu A&B, Covid) Nasopharyngeal Swab     Status: None   Collection Time: 11/20/20  6:21 PM   Specimen: Nasopharyngeal Swab; Nasopharyngeal(NP) swabs in vial transport medium  Result Value Ref Range Status   SARS Coronavirus 2 by RT PCR NEGATIVE NEGATIVE Final    Comment: (NOTE) SARS-CoV-2 target nucleic acids are  NOT DETECTED.  The SARS-CoV-2 RNA is generally detectable in upper respiratory specimens during the acute phase of infection. The lowest concentration of SARS-CoV-2 viral copies this assay can detect is 138 copies/mL. A negative result does not preclude SARS-Cov-2 infection and should not be used as the sole basis for treatment or other patient management decisions. A negative result may occur with  improper specimen collection/handling, submission of specimen other than nasopharyngeal swab, presence of viral mutation(s) within the areas targeted by this assay, and inadequate number of viral copies(<138 copies/mL). A negative result must be combined with clinical observations, patient history, and epidemiological information. The expected result is Negative.  Fact Sheet for Patients:  EntrepreneurPulse.com.au  Fact Sheet for Healthcare Providers:  IncredibleEmployment.be  This test is no t yet approved or cleared by the [place] FDA and  has been authorized for detection and/or diagnosis of SARS-CoV-2 by FDA under an Emergency Use Authorization (EUA). This EUA will remain  in effect (meaning this test can be used) for the duration of the COVID-19 declaration under Section 564(b)(1) of the Act, 21 U.S.C.section 360bbb-3(b)(1), unless the authorization is terminated  or revoked sooner.       Influenza A by PCR NEGATIVE NEGATIVE Final   Influenza B by PCR NEGATIVE NEGATIVE Final    Comment: (NOTE) The Xpert Xpress SARS-CoV-2/FLU/RSV plus assay is intended as an aid in the diagnosis of influenza  from Nasopharyngeal swab specimens and should not be used as a sole basis for treatment. Nasal washings and aspirates are unacceptable for Xpert Xpress SARS-CoV-2/FLU/RSV testing.  Fact Sheet for Patients: EntrepreneurPulse.com.au  Fact Sheet for Healthcare Providers: IncredibleEmployment.be  This test is not  yet approved or cleared by the [place] FDA and has been authorized for detection and/or diagnosis of SARS-CoV-2 by FDA under an Emergency Use Authorization (EUA). This EUA will remain in effect (meaning this test can be used) for the duration of the COVID-19 declaration under Section 564(b)(1) of the Act, 21 U.S.C. section 360bbb-3(b)(1), unless the authorization is terminated or revoked.  Performed at [facility], [address], [address], [place] [address]      Scheduled Meds: . amLODipine  5 mg Oral Daily  . apixaban  2.5 mg Oral BID  . aspirin EC  81 mg Oral Q breakfast  . atorvastatin  20 mg Oral QHS  . feeding supplement  237 mL Oral BID  . furosemide  80 mg Intravenous BID  . hydrALAZINE  50 mg Oral BID  . insulin aspart  0-15 Units Subcutaneous TID WC  . insulin aspart  0-5 Units Subcutaneous QHS  . isosorbide dinitrate  5 mg Oral BID  . loratadine  10 mg Oral Daily  . metoprolol succinate  25 mg Oral Daily  . sacubitril-valsartan  0.5 tablet Oral BID   Continuous Infusions:  Procedures/Studies: CT ABDOMEN PELVIS WO CONTRAST  Result Date: 11/24/2020 CLINICAL DATA:  Abdominal distension. Acute nonlocalized abdominal pain. EXAM: CT ABDOMEN AND PELVIS WITHOUT CONTRAST TECHNIQUE: Multidetector CT imaging of the abdomen and pelvis was performed following the standard protocol without IV contrast. COMPARISON:  None. FINDINGS: Lower chest: There multiple prominent perihilar air cysts bilaterally, only some of which are related to dilated central bronchi. There is an irregular nodule peripherally in the left upper lobe measuring 1.6 x 0.7 cm on image 19/4. The lung bases are otherwise clear. No significant pleural or pericardial effusion. Patient is status post median sternotomy, CABG and probable aortic grafting. There is diffuse aortic and coronary artery atherosclerosis. The heart is mildly enlarged. Hepatobiliary: No focal hepatic abnormalities are identified on noncontrast  imaging. No definite signs of cirrhosis. There is possible mild periportal edema. No evidence of gallstones, gallbladder wall thickening or biliary dilatation. Pancreas: Unremarkable. No pancreatic ductal dilatation or surrounding inflammatory changes. Spleen: Normal in size without focal abnormality. Adrenals/Urinary Tract: Both adrenal glands appear normal. There are renal vascular calcifications bilaterally without definite urinary tract calculi. Bilateral renal cysts, measuring up to 4.4 cm in the upper pole of the left kidney. No hydronephrosis. The bladder is nearly empty without apparent abnormality. Stomach/Bowel: Enteric contrast was administered and has passed into the distal small bowel and colon. The stomach appears unremarkable for its degree of distension. No evidence of bowel wall thickening, distention or surrounding inflammatory change. There are diverticular changes of the distal colon. The appendix is not clearly visualized. Vascular/Lymphatic: Nodal assessment limited by generalized soft tissue edema and lack of contrast. No enlarged abdominopelvic lymph nodes identified. Diffuse aortic and branch vessel atherosclerosis. Reproductive: Status post hysterectomy. Probable residual ovarian tissue bilaterally without suspicious adnexal findings. Other: There is a moderate amount of ascites throughout the peritoneal cavity. There is generalized edema throughout the subcutaneous and deep fat. No extravasated enteric contrast, focal fluid collection or extraluminal air collection identified. Musculoskeletal: Relatively mild degenerative changes throughout the spine and both hips. IMPRESSION: 1. Anasarca with ascites and generalized soft tissue edema.  No focal fluid collections or pleural effusions identified. If the etiology for the ascites is a known clinically, paracentesis should be considered. 2. Distal colonic diverticulosis without bowel obstruction or focal inflammation. 3. Cystic lung disease, in  part secondary to cystic bronchiectasis. 4. Diffuse Aortic Atherosclerosis (ICD10-I70.0). Electronically Signed   By: Richardean Sale M.D.   On: 11/24/2020 21:43   DG Chest Portable 1 View  Result Date: 11/20/2020 CLINICAL DATA:  Dyspnea, COVID infection a few months prior EXAM: PORTABLE CHEST 1 VIEW COMPARISON:  09/14/2020 chest radiograph. FINDINGS: Stable configuration of median sternotomy wires with chronic lower most sternotomy wire discontinuity. Stable cardiomediastinal silhouette with moderate cardiomegaly. No pneumothorax. No pleural effusion. Cephalization of the pulmonary vasculature without overt pulmonary edema. No acute consolidative airspace disease. IMPRESSION: Moderate cardiomegaly without overt pulmonary edema. No active pulmonary disease. Electronically Signed   By: Ilona Sorrel M.D.   On: 11/20/2020 19:01   ECHOCARDIOGRAM COMPLETE  Result Date: 11/25/2020    ECHOCARDIOGRAM REPORT   Patient Name:   Julia Hart Date of Exam: 11/25/2020 Medical Rec #:  [id number]     Height:       66.0 in Accession #:    [id number]    Weight:       143.3 lb Date of Birth:  12-13-1926     BSA:          1.736 m Patient Age:    19 years      BP:           134/45 mmHg Patient Gender: F             HR:           83 bpm. Exam Location:  Inpatient Procedure: 2D Echo, Cardiac Doppler and Color Doppler Indications:    Dyspnea R06.00  History:        Patient has prior history of Echocardiogram examinations, most                 recent 08/04/2020. CAD; Risk Factors:Hypertension and                 Dyslipidemia.  Sonographer:    Tiffany Dance Referring Phys: [id number] [place]  1. Left ventricular ejection fraction, by estimation, is 55%. The left ventricle has normal function. The left ventricle has no regional wall motion abnormalities. There is severe left ventricular hypertrophy. Left ventricular diastolic parameters are indeterminate.  2. Right ventricular systolic function is severely reduced. The  right ventricular size is moderately enlarged. The estimated right ventricular systolic pressure is 0000000 mmHg secondary to annular dilation and lack of TV coaptation, TR velocity is low.  3. Left atrial size was severely dilated.  4. Right atrial size was severely dilated.  5. A small pericardial effusion is present.  6. The mitral valve is degenerative. Mild to moderate mitral valve regurgitation. No evidence of mitral stenosis.  7. Tricuspid valve regurgitation is severe.  8. The aortic valve is abnormal. There is severe calcifcation of the aortic valve. Aortic valve regurgitation is moderate. Mild aortic valve stenosis. Aortic valve mean gradient measures 12.8 mmHg.  9. The inferior vena cava is dilated in size with <50% respiratory variability, suggesting right atrial pressure of 15 mmHg. FINDINGS  Left Ventricle: Left ventricular ejection fraction, by estimation, is 55%. The left ventricle has normal function. The left ventricle has no regional wall motion abnormalities. The left ventricular internal cavity size was normal in size. There is severe left ventricular hypertrophy.  Left ventricular diastolic parameters are indeterminate. Right Ventricle: The right ventricular size is moderately enlarged. Right vetricular wall thickness was not well visualized. Right ventricular systolic function is severely reduced. The tricuspid regurgitant velocity is 1.25 m/s, and with an assumed right atrial pressure of 15 mmHg, the estimated right ventricular systolic pressure is 21.2 mmHg. Left Atrium: Left atrial size was severely dilated. Right Atrium: Right atrial size was severely dilated. Pericardium: A small pericardial effusion is present. Mitral Valve: The mitral valve is degenerative in appearance. Mild mitral annular calcification. Mild to moderate mitral valve regurgitation. No evidence of mitral valve stenosis. Tricuspid Valve: The tricuspid valve is grossly normal. Tricuspid valve regurgitation is severe. Aortic  Valve: The aortic valve is abnormal. There is severe calcifcation of the aortic valve. Aortic valve regurgitation is moderate. Aortic regurgitation PHT measures 341 msec. Mild aortic stenosis is present. Aortic valve mean gradient measures 12.8 mmHg. Aortic valve peak gradient measures 24.0 mmHg. Aortic valve area, by VTI measures 1.15 cm. Pulmonic Valve: The pulmonic valve was grossly normal. Pulmonic valve regurgitation is mild to moderate. Aorta: The aortic root and ascending aorta are structurally normal, with no evidence of dilitation. Venous: The inferior vena cava is dilated in size with less than 50% respiratory variability, suggesting right atrial pressure of 15 mmHg. IAS/Shunts: No atrial level shunt detected by color flow Doppler.  LEFT VENTRICLE PLAX 2D LVIDd:         3.98 cm LVIDs:         2.73 cm LV PW:         1.76 cm LV IVS:        1.58 cm LVOT diam:     2.00 cm LV SV:         68 LV SV Index:   39 LVOT Area:     3.14 cm  RIGHT VENTRICLE RV Basal diam:  3.54 cm RV Mid diam:    2.78 cm RV S prime:     5.61 cm/s TAPSE (M-mode): 0.7 cm LEFT ATRIUM              Index       RIGHT ATRIUM           Index LA diam:        4.80 cm  2.77 cm/m  RA Area:     25.10 cm LA Vol (A2C):   110.0 ml 63.38 ml/m RA Volume:   88.60 ml  51.05 ml/m LA Vol (A4C):   93.3 ml  53.76 ml/m LA Biplane Vol: 102.0 ml 58.77 ml/m  AORTIC VALVE AV Area (Vmax):    1.10 cm AV Area (Vmean):   1.10 cm AV Area (VTI):     1.15 cm AV Vmax:           244.80 cm/s AV Vmean:          172.600 cm/s AV VTI:            0.593 m AV Peak Grad:      24.0 mmHg AV Mean Grad:      12.8 mmHg LVOT Vmax:         85.35 cm/s LVOT Vmean:        60.200 cm/s LVOT VTI:          0.217 m LVOT/AV VTI ratio: 0.37 AI PHT:            341 msec  AORTA Ao Root diam: 2.80 cm Ao Asc diam:  2.80 cm MITRAL VALVE  TRICUSPID VALVE MV Area (PHT): 3.66 cm    TR Peak grad:   6.2 mmHg MV Decel Time: 207 msec    TR Vmax:        125.00 cm/s MV E velocity: 88.80  cm/s MV A velocity: 53.30 cm/s  SHUNTS MV E/A ratio:  1.67        Systemic VTI:  0.22 m                            Systemic Diam: 2.00 cm Cherlynn Kaiser MD Electronically signed by Cherlynn Kaiser MD Signature Date/Time: 11/25/2020/3:17:27 PM    Final     [name], DO  [facility]  If 7PM-7AM, please contact night-coverage [email or web address] Password [email or web address] 11/25/2020, 4:13 PM   LOS: 4 days

## 2020-11-25 NOTE — Progress Notes (Signed)
  Echocardiogram 2D Echocardiogram has been performed.  Julia Hart 11/25/2020, 2:33 PM

## 2020-11-26 DIAGNOSIS — I5043 Acute on chronic combined systolic (congestive) and diastolic (congestive) heart failure: Secondary | ICD-10-CM | POA: Diagnosis not present

## 2020-11-26 DIAGNOSIS — E871 Hypo-osmolality and hyponatremia: Secondary | ICD-10-CM | POA: Diagnosis not present

## 2020-11-26 DIAGNOSIS — N1832 Chronic kidney disease, stage 3b: Secondary | ICD-10-CM | POA: Diagnosis not present

## 2020-11-26 DIAGNOSIS — R778 Other specified abnormalities of plasma proteins: Secondary | ICD-10-CM | POA: Diagnosis not present

## 2020-11-26 LAB — BASIC METABOLIC PANEL
Anion gap: 5 (ref 5–15)
BUN: 70 mg/dL — ABNORMAL HIGH (ref 8–23)
CO2: 31 mmol/L (ref 22–32)
Calcium: 8.4 mg/dL — ABNORMAL LOW (ref 8.9–10.3)
Chloride: 96 mmol/L — ABNORMAL LOW (ref 98–111)
Creatinine, Ser: 1.66 mg/dL — ABNORMAL HIGH (ref 0.44–1.00)
GFR, Estimated: 29 mL/min — ABNORMAL LOW (ref >60)
Glucose, Bld: 92 mg/dL (ref 70–99)
Potassium: 4.2 mmol/L (ref 3.5–5.1)
Sodium: 132 mmol/L — ABNORMAL LOW (ref 135–145)

## 2020-11-26 LAB — GLUCOSE, CAPILLARY
Glucose-Capillary: 88 mg/dL (ref 70–99)
Glucose-Capillary: 125 mg/dL — ABNORMAL HIGH (ref 70–99)
Glucose-Capillary: 127 mg/dL — ABNORMAL HIGH (ref 70–99)
Glucose-Capillary: 89 mg/dL (ref 70–99)

## 2020-11-26 LAB — MAGNESIUM: Magnesium: 2.2 mg/dL (ref 1.7–2.4)

## 2020-11-26 NOTE — Progress Notes (Signed)
PROGRESS NOTE  Julia Hart [id number] DOB: [birth year] DOA: 11/20/2020 PCP: [name], MD   Brief History: 87 year old female with a history of coronary artery disease/NSTEMI, hypertension, hyperlipidemia, ischemic cardiomyopathy with previous EF 35% presenting with 1 week history of shortness of breath, orthopnea,and worsening lower extremity edema.She was started on IV lasix with good clinical improvement. Cardiology was consulted to assist. For her CKD 3, tolerance for worsening creatinine was allowed for improved euvolemia. IV lasix dosing has been gradually titrated up with relative stability of serum creatinine.  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 -[id number] echo EF 50% -08/05/20 Echo 65-70%, no WMA, mod enlarge RV, mod TR; small pericardial effusion -11/25/20 Echo--EF 55%, severe reduced RV function, severe TR -pt states dry weight ~136 lbs -11/26/20 standing weight 141.2 -Previously had echo with a EF of 35%in [place] -Continue IV furosemide--increased to 80 mg bid -Accurate I's and O's--incomplete -Continue isosorbide, hydralazine -Continue Entresto for now  CKD stage 3b -baseline creatinine 1.3-1.6 -monitor with diuresis -will tolerated higher serum creatinine for euvolemia  Coronary artery disease -history of NSTEMI and PCI of the ostial and proximal RCA 08/27/2017 -No chest pain presently -08/27/2017 cath--RCA ostial 80% calcified stenosis;occluded SVG jump graft;first OM 70% stenosis;90% circumflex stenosis -ContinueASAand apixaban   Essential hypertension -Continue hydralazine and isosorbide -continue amlodipine and metoprolol succinate  Hyperlipidemia -Continue statin  GERD -Continue Protonix  Chronic atrial fibrillation -Rate controlled -Continue apixaban -continue metoprolol succinate  Hypokalemia -repleted -mag 2.2  Hyponatremia -due to CHF -continue diuresis  Arm/Back itch -no  visible rash on exam -start loratidine-->improved -start sarna>>improved -pt states it's intermittent for past one month  Abdominal Pain -CT abd/pelvis--Anasarca with ascites and generalized soft tissue edema. No focal fluid collections or pleural effusions identified;  No focal hepatic abnormalities;  Distal colonic diverticulosis without bowel obstruction or focal inflammation.    Status is: Inpatient  Remains inpatient appropriate because:IV treatments appropriate due to intensity of illness or inability to take PO   Dispo: The patient is from:Home Anticipated d/c is [id number] Patient currently is not medically stable to d/c. Difficult to place patient No        Family Communication:niece updatedat bedside 5/5  Consultants:cardiology  Code Status: DNR  DVT Prophylaxis:apixaban   Procedures: As Listed in Progress Note Above  Antibiotics: None   Subjective: Patient complains of some dyspnea on exertion, but improving.  Denies f/c, cp, n/v/d, abd pain.  Had BM yesterday  Objective: Vitals:   11/25/20 0600 11/25/20 2048 11/26/20 0418 11/26/20 0640  BP:  (!) 158/50  135/66  Pulse:  63  84  Resp:  16  18  Temp:  98.4 F (36.9 C)  97.6 F (36.4 C)  TempSrc:  Oral    SpO2:  100%  100%  Weight: 65 kg  70 kg     Intake/Output Summary (Last 24 hours) at 11/26/2020 1349 Last data filed at 11/25/2020 1700 Gross per 24 hour  Intake 240 ml  Output --  Net 240 ml   Weight change: 5.68 kg Exam:   General:  Pt is alert, follows commands appropriately, not in acute distress  HEENT: No icterus, No thrush, No neck mass, /AT  Cardiovascular: RRR, S1/S2, no rubs, no gallops  Respiratory: bibasilar crackles. No wheeze  Abdomen: Soft/+BS, non tender, non distended, no guarding  Extremities: 1 + LE edema, No lymphangitis, No petechiae, No rashes, no synovitis   Data Reviewed: I have  personally reviewed following labs  and imaging studies Basic Metabolic Panel: Recent Labs  Lab 11/21/20 0213 11/21/20 1630 11/23/20 0548 11/24/20 0602 11/25/20 0528 11/26/20 0411  NA 132* 133* 136 135 134* 132*  K 3.1* 4.0 4.2 4.2 4.1 4.2  CL 99 99 100 101 98 96*  CO2 23 26 27 26 26 31   GLUCOSE 112* 100* 85 90 77 92  BUN 49* 51* 59* 64* 70* 70*  CREATININE 1.68* 1.78* 1.82* 1.99* 1.83* 1.66*  CALCIUM 8.7* 8.8* 8.9 8.7* 8.8* 8.4*  MG 1.8  --  2.2  --  2.1 2.2   Liver Function Tests: Recent Labs  Lab 11/20/20 1840 11/21/20 0213 11/23/20 0548  AST 27 24 26   ALT 12 10 11   ALKPHOS 76 73 65  BILITOT 0.7 0.9 0.6  PROT 7.6 7.5 6.9  ALBUMIN 3.3* 3.4* 3.4*   No results for input(s): LIPASE, AMYLASE in the last 168 hours. No results for input(s): AMMONIA in the last 168 hours. Coagulation Profile: No results for input(s): INR, PROTIME in the last 168 hours. CBC: Recent Labs  Lab 11/20/20 1840 11/21/20 0213 11/23/20 0548 11/25/20 0528  WBC 3.1* 3.3* 4.0 3.7*  NEUTROABS 1.4* 1.5*  --   --   HGB 9.4* 9.7* 8.9* 8.5*  HCT 28.8* 29.8* 27.3* 26.1*  MCV 87.3 88.2 88.3 86.4  PLT 136* 128* 114* 103*   Cardiac Enzymes: No results for input(s): CKTOTAL, CKMB, CKMBINDEX, TROPONINI in the last 168 hours. BNP: Invalid input(s): POCBNP CBG: Recent Labs  Lab 11/25/20 1105 11/25/20 1614 11/25/20 2153 11/26/20 0714 11/26/20 1133  GLUCAP 122* 104* 101* 88 125*   HbA1C: No results for input(s): HGBA1C in the last 72 hours. Urine analysis: No results found for: COLORURINE, APPEARANCEUR, LABSPEC, PHURINE, GLUCOSEU, HGBUR, BILIRUBINUR, KETONESUR, PROTEINUR, UROBILINOGEN, NITRITE, LEUKOCYTESUR Sepsis Labs: @LABRCNTIP (procalcitonin:4,lacticidven:4) ) Recent Results (from the past 240 hour(s))  Resp Panel by RT-PCR (Flu A&B, Covid) Nasopharyngeal Swab     Status: None   Collection Time: 11/20/20  6:21 PM   Specimen: Nasopharyngeal Swab; Nasopharyngeal(NP) swabs in vial transport  medium  Result Value Ref Range Status   SARS Coronavirus 2 by RT PCR NEGATIVE NEGATIVE Final    Comment: (NOTE) SARS-CoV-2 target nucleic acids are NOT DETECTED.  The SARS-CoV-2 RNA is generally detectable in upper respiratory specimens during the acute phase of infection. The lowest concentration of SARS-CoV-2 viral copies this assay can detect is 138 copies/mL. A negative result does not preclude SARS-Cov-2 infection and should not be used as the sole basis for treatment or other patient management decisions. A negative result may occur with  improper specimen collection/handling, submission of specimen other than nasopharyngeal swab, presence of viral mutation(s) within the areas targeted by this assay, and inadequate number of viral copies(<138 copies/mL). A negative result must be combined with clinical observations, patient history, and epidemiological information. The expected result is Negative.  Fact Sheet for Patients:  EntrepreneurPulse.com.au  Fact Sheet for Healthcare Providers:  IncredibleEmployment.be  This test is no t yet approved or cleared by the [place] FDA and  has been authorized for detection and/or diagnosis of SARS-CoV-2 by FDA under an Emergency Use Authorization (EUA). This EUA will remain  in effect (meaning this test can be used) for the duration of the COVID-19 declaration under Section 564(b)(1) of the Act, 21 U.S.C.section 360bbb-3(b)(1), unless the authorization is terminated  or revoked sooner.       Influenza A by PCR NEGATIVE NEGATIVE Final   Influenza B by PCR NEGATIVE NEGATIVE Final  Comment: (NOTE) The Xpert Xpress SARS-CoV-2/FLU/RSV plus assay is intended as an aid in the diagnosis of influenza from Nasopharyngeal swab specimens and should not be used as a sole basis for treatment. Nasal washings and aspirates are unacceptable for Xpert Xpress SARS-CoV-2/FLU/RSV testing.  Fact Sheet for  Patients: EntrepreneurPulse.com.au  Fact Sheet for Healthcare Providers: IncredibleEmployment.be  This test is not yet approved or cleared by the [place] FDA and has been authorized for detection and/or diagnosis of SARS-CoV-2 by FDA under an Emergency Use Authorization (EUA). This EUA will remain in effect (meaning this test can be used) for the duration of the COVID-19 declaration under Section 564(b)(1) of the Act, 21 U.S.C. section 360bbb-3(b)(1), unless the authorization is terminated or revoked.  Performed at [facility], [address], [place],  [address]      Scheduled Meds: . amLODipine  5 mg Oral Daily  . apixaban  2.5 mg Oral BID  . aspirin EC  81 mg Oral Q breakfast  . atorvastatin  20 mg Oral QHS  . feeding supplement  237 mL Oral BID  . furosemide  80 mg Intravenous BID  . hydrALAZINE  50 mg Oral BID  . insulin aspart  0-15 Units Subcutaneous TID WC  . insulin aspart  0-5 Units Subcutaneous QHS  . isosorbide dinitrate  5 mg Oral BID  . loratadine  10 mg Oral Daily  . metoprolol succinate  25 mg Oral Daily  . polyethylene glycol  17 g Oral Daily  . sacubitril-valsartan  0.5 tablet Oral BID  . senna  1 tablet Oral Daily   Continuous Infusions:  Procedures/Studies: CT ABDOMEN PELVIS WO CONTRAST  Result Date: 11/24/2020 CLINICAL DATA:  Abdominal distension. Acute nonlocalized abdominal pain. EXAM: CT ABDOMEN AND PELVIS WITHOUT CONTRAST TECHNIQUE: Multidetector CT imaging of the abdomen and pelvis was performed following the standard protocol without IV contrast. COMPARISON:  None. FINDINGS: Lower chest: There multiple prominent perihilar air cysts bilaterally, only some of which are related to dilated central bronchi. There is an irregular nodule peripherally in the left upper lobe measuring 1.6 x 0.7 cm on image 19/4. The lung bases are otherwise clear. No significant pleural or pericardial effusion. Patient is status  post median sternotomy, CABG and probable aortic grafting. There is diffuse aortic and coronary artery atherosclerosis. The heart is mildly enlarged. Hepatobiliary: No focal hepatic abnormalities are identified on noncontrast imaging. No definite signs of cirrhosis. There is possible mild periportal edema. No evidence of gallstones, gallbladder wall thickening or biliary dilatation. Pancreas: Unremarkable. No pancreatic ductal dilatation or surrounding inflammatory changes. Spleen: Normal in size without focal abnormality. Adrenals/Urinary Tract: Both adrenal glands appear normal. There are renal vascular calcifications bilaterally without definite urinary tract calculi. Bilateral renal cysts, measuring up to 4.4 cm in the upper pole of the left kidney. No hydronephrosis. The bladder is nearly empty without apparent abnormality. Stomach/Bowel: Enteric contrast was administered and has passed into the distal small bowel and colon. The stomach appears unremarkable for its degree of distension. No evidence of bowel wall thickening, distention or surrounding inflammatory change. There are diverticular changes of the distal colon. The appendix is not clearly visualized. Vascular/Lymphatic: Nodal assessment limited by generalized soft tissue edema and lack of contrast. No enlarged abdominopelvic lymph nodes identified. Diffuse aortic and branch vessel atherosclerosis. Reproductive: Status post hysterectomy. Probable residual ovarian tissue bilaterally without suspicious adnexal findings. Other: There is a moderate amount of ascites throughout the peritoneal cavity. There is generalized edema throughout the subcutaneous and  deep fat. No extravasated enteric contrast, focal fluid collection or extraluminal air collection identified. Musculoskeletal: Relatively mild degenerative changes throughout the spine and both hips. IMPRESSION: 1. Anasarca with ascites and generalized soft tissue edema. No focal fluid collections or  pleural effusions identified. If the etiology for the ascites is a known clinically, paracentesis should be considered. 2. Distal colonic diverticulosis without bowel obstruction or focal inflammation. 3. Cystic lung disease, in part secondary to cystic bronchiectasis. 4. Diffuse Aortic Atherosclerosis (ICD10-I70.0). Electronically Signed   By: Richardean Sale M.D.   On: 11/24/2020 21:43   DG Chest Portable 1 View  Result Date: 11/20/2020 CLINICAL DATA:  Dyspnea, COVID infection a few months prior EXAM: PORTABLE CHEST 1 VIEW COMPARISON:  09/14/2020 chest radiograph. FINDINGS: Stable configuration of median sternotomy wires with chronic lower most sternotomy wire discontinuity. Stable cardiomediastinal silhouette with moderate cardiomegaly. No pneumothorax. No pleural effusion. Cephalization of the pulmonary vasculature without overt pulmonary edema. No acute consolidative airspace disease. IMPRESSION: Moderate cardiomegaly without overt pulmonary edema. No active pulmonary disease. Electronically Signed   By: Ilona Sorrel M.D.   On: 11/20/2020 19:01   ECHOCARDIOGRAM COMPLETE  Result Date: 11/25/2020    ECHOCARDIOGRAM REPORT   Patient Name:   Julia Hart Date of Exam: 11/25/2020 Medical Rec #:  [id number]     Height:       66.0 in Accession #:    [id number]    Weight:       143.3 lb Date of Birth:  02/01/1927     BSA:          1.736 m Patient Age:    29 years      BP:           134/45 mmHg Patient Gender: F             HR:           83 bpm. Exam Location:  Inpatient Procedure: 2D Echo, Cardiac Doppler and Color Doppler Indications:    Dyspnea R06.00  History:        Patient has prior history of Echocardiogram examinations, most                 recent 08/04/2020. CAD; Risk Factors:Hypertension and                 Dyslipidemia.  Sonographer:    Tiffany Dance Referring Phys: [id number] [place]  1. Left ventricular ejection fraction, by estimation, is 55%. The left ventricle has normal function.  The left ventricle has no regional wall motion abnormalities. There is severe left ventricular hypertrophy. Left ventricular diastolic parameters are indeterminate.  2. Right ventricular systolic function is severely reduced. The right ventricular size is moderately enlarged. The estimated right ventricular systolic pressure is 27.0 mmHg secondary to annular dilation and lack of TV coaptation, TR velocity is low.  3. Left atrial size was severely dilated.  4. Right atrial size was severely dilated.  5. A small pericardial effusion is present.  6. The mitral valve is degenerative. Mild to moderate mitral valve regurgitation. No evidence of mitral stenosis.  7. Tricuspid valve regurgitation is severe.  8. The aortic valve is abnormal. There is severe calcifcation of the aortic valve. Aortic valve regurgitation is moderate. Mild aortic valve stenosis. Aortic valve mean gradient measures 12.8 mmHg.  9. The inferior vena cava is dilated in size with <50% respiratory variability, suggesting right atrial pressure of 15 mmHg. FINDINGS  Left Ventricle: Left ventricular ejection fraction,  by estimation, is 55%. The left ventricle has normal function. The left ventricle has no regional wall motion abnormalities. The left ventricular internal cavity size was normal in size. There is severe left ventricular hypertrophy. Left ventricular diastolic parameters are indeterminate. Right Ventricle: The right ventricular size is moderately enlarged. Right vetricular wall thickness was not well visualized. Right ventricular systolic function is severely reduced. The tricuspid regurgitant velocity is 1.25 m/s, and with an assumed right atrial pressure of 15 mmHg, the estimated right ventricular systolic pressure is 0000000 mmHg. Left Atrium: Left atrial size was severely dilated. Right Atrium: Right atrial size was severely dilated. Pericardium: A small pericardial effusion is present. Mitral Valve: The mitral valve is degenerative in  appearance. Mild mitral annular calcification. Mild to moderate mitral valve regurgitation. No evidence of mitral valve stenosis. Tricuspid Valve: The tricuspid valve is grossly normal. Tricuspid valve regurgitation is severe. Aortic Valve: The aortic valve is abnormal. There is severe calcifcation of the aortic valve. Aortic valve regurgitation is moderate. Aortic regurgitation PHT measures 341 msec. Mild aortic stenosis is present. Aortic valve mean gradient measures 12.8 mmHg. Aortic valve peak gradient measures 24.0 mmHg. Aortic valve area, by VTI measures 1.15 cm. Pulmonic Valve: The pulmonic valve was grossly normal. Pulmonic valve regurgitation is mild to moderate. Aorta: The aortic root and ascending aorta are structurally normal, with no evidence of dilitation. Venous: The inferior vena cava is dilated in size with less than 50% respiratory variability, suggesting right atrial pressure of 15 mmHg. IAS/Shunts: No atrial level shunt detected by color flow Doppler.  LEFT VENTRICLE PLAX 2D LVIDd:         3.98 cm LVIDs:         2.73 cm LV PW:         1.76 cm LV IVS:        1.58 cm LVOT diam:     2.00 cm LV SV:         68 LV SV Index:   39 LVOT Area:     3.14 cm  RIGHT VENTRICLE RV Basal diam:  3.54 cm RV Mid diam:    2.78 cm RV S prime:     5.61 cm/s TAPSE (M-mode): 0.7 cm LEFT ATRIUM              Index       RIGHT ATRIUM           Index LA diam:        4.80 cm  2.77 cm/m  RA Area:     25.10 cm LA Vol (A2C):   110.0 ml 63.38 ml/m RA Volume:   88.60 ml  51.05 ml/m LA Vol (A4C):   93.3 ml  53.76 ml/m LA Biplane Vol: 102.0 ml 58.77 ml/m  AORTIC VALVE AV Area (Vmax):    1.10 cm AV Area (Vmean):   1.10 cm AV Area (VTI):     1.15 cm AV Vmax:           244.80 cm/s AV Vmean:          172.600 cm/s AV VTI:            0.593 m AV Peak Grad:      24.0 mmHg AV Mean Grad:      12.8 mmHg LVOT Vmax:         85.35 cm/s LVOT Vmean:        60.200 cm/s LVOT VTI:          0.217 m LVOT/AV  VTI ratio: 0.37 AI PHT:             341 msec  AORTA Ao Root diam: 2.80 cm Ao Asc diam:  2.80 cm MITRAL VALVE               TRICUSPID VALVE MV Area (PHT): 3.66 cm    TR Peak grad:   6.2 mmHg MV Decel Time: 207 msec    TR Vmax:        125.00 cm/s MV E velocity: 88.80 cm/s MV A velocity: 53.30 cm/s  SHUNTS MV E/A ratio:  1.67        Systemic VTI:  0.22 m                            Systemic Diam: 2.00 cm Cherlynn Kaiser MD Electronically signed by Cherlynn Kaiser MD Signature Date/Time: 11/25/2020/3:17:27 PM    Final     [name], DO  [facility]  If 7PM-7AM, please contact night-coverage [email or web address] Password [email or web address] 11/26/2020, 1:49 PM   LOS: 5 days

## 2020-11-27 DIAGNOSIS — E871 Hypo-osmolality and hyponatremia: Secondary | ICD-10-CM | POA: Diagnosis not present

## 2020-11-27 DIAGNOSIS — I5043 Acute on chronic combined systolic (congestive) and diastolic (congestive) heart failure: Secondary | ICD-10-CM | POA: Diagnosis not present

## 2020-11-27 DIAGNOSIS — N1832 Chronic kidney disease, stage 3b: Secondary | ICD-10-CM | POA: Diagnosis not present

## 2020-11-27 DIAGNOSIS — I5033 Acute on chronic diastolic (congestive) heart failure: Secondary | ICD-10-CM

## 2020-11-27 DIAGNOSIS — I4819 Other persistent atrial fibrillation: Secondary | ICD-10-CM

## 2020-11-27 LAB — BASIC METABOLIC PANEL
Anion gap: 9 (ref 5–15)
BUN: 72 mg/dL — ABNORMAL HIGH (ref 8–23)
CO2: 29 mmol/L (ref 22–32)
Calcium: 8.9 mg/dL (ref 8.9–10.3)
Chloride: 99 mmol/L (ref 98–111)
Creatinine, Ser: 1.7 mg/dL — ABNORMAL HIGH (ref 0.44–1.00)
GFR, Estimated: 28 mL/min — ABNORMAL LOW (ref >60)
Glucose, Bld: 86 mg/dL (ref 70–99)
Potassium: 4.4 mmol/L (ref 3.5–5.1)
Sodium: 137 mmol/L (ref 135–145)

## 2020-11-27 LAB — GLUCOSE, CAPILLARY
Glucose-Capillary: 92 mg/dL (ref 70–99)
Glucose-Capillary: 140 mg/dL — ABNORMAL HIGH (ref 70–99)
Glucose-Capillary: 105 mg/dL — ABNORMAL HIGH (ref 70–99)
Glucose-Capillary: 106 mg/dL — ABNORMAL HIGH (ref 70–99)

## 2020-11-27 MED ORDER — HYDRALAZINE HCL 10 MG PO TABS: 10.0000 mg | ORAL_TABLET | Freq: Two times a day (BID) | ORAL | Status: DC

## 2020-11-27 MED ORDER — SENNA 8.6 MG PO TABS
2.0000 | ORAL_TABLET | Freq: Every day | ORAL | Status: DC
  Administered 2020-11-28 – 2020-11-29 (×2): 17.2 mg via ORAL
  Filled 2020-11-27 (×2): qty 2

## 2020-11-27 MED ORDER — PREDNISOLONE ACETATE 1 % OP SUSP
1.0000 [drp] | OPHTHALMIC | Status: DC
  Administered 2020-11-27: 1 [drp] via OPHTHALMIC
  Filled 2020-11-27: qty 1

## 2020-11-27 NOTE — Progress Notes (Signed)
MD notified of patients blood pressure of 117/44 HR 60. MD ordered to hold all heart medications at this time due to low blood pressure. Patient is asymptomatic at this time. Will continue to monitor.

## 2020-11-27 NOTE — Progress Notes (Signed)
Physical Therapy Treatment Patient Details Name: Julia Hart MRN: [id number] DOB: Mar 14, 1927 Today's Date: 11/27/2020    History of Present Illness Julia Hart is a 87 y.o. female presents with c/o dyspnea, BLE swelling, orthopnea. PMH: HTN, CAD, CHF, CABG 2008    PT Comments    Patient presents upright in bed, is awake, alert and cooperative. Patient transfers with Mod I, in slow manner using UEs for assist. Patient able to sit at bedside safely with UE support. Able to stand form bed Mod I using UE assist and RW for standing support. Patient able to ambulate 120 feet in hallway with RW, showing good stability and safety awareness navigating turns. Reviewed and performed ankle pumps and heel slides in bed for LE mobility and improved circulation. Patient able to reposition in bed Mod I. Overall good tolerance in session, with minimal fatigue. Patient left upright in bed with phone and call bell in reach. Patient will benefit from continued physical therapy in hospital and recommended venue below to increase strength, balance, endurance for safe ADLs and gait.     Follow Up Recommendations  Home health PT;Supervision - Intermittent     Equipment Recommendations  None recommended by PT    Recommendations for Other Services       Precautions / Restrictions Precautions Precautions: Fall    Mobility  Bed Mobility Overal bed mobility: Modified Independent               Patient Response: Cooperative  Transfers Overall transfer level: Modified independent Equipment used: Rolling walker (2 wheeled)   Sit to Stand: Modified independent (Device/Increase time)            Ambulation/Gait Ambulation/Gait assistance: Supervision Gait Distance (Feet): 120 Feet Assistive device: Rolling walker (2 wheeled) Gait Pattern/deviations: Step-through pattern;Decreased step length - right;Decreased step length - left;Decreased stride length;Trunk flexed Gait velocity: slightly  decreased   General Gait Details: slow, steady cadence, good safety awareness navigating turns   Chief Strategy Officer    Modified Rankin (Stroke Patients Only)       Balance Overall balance assessment: Needs assistance   Sitting balance-Leahy Scale: Good Sitting balance - Comments: seated EOB   Standing balance support: During functional activity Standing balance-Leahy Scale: Fair Standing balance comment: good with RW                            Cognition Arousal/Alertness: Awake/alert Behavior During Therapy: WFL for tasks assessed/performed Overall Cognitive Status: Within Functional Limits for tasks assessed                                        Exercises General Exercises - Lower Extremity Ankle Circles/Pumps: Both;10 reps;Supine;AROM Heel Slides: AROM;Both;5 reps    General Comments        Pertinent Vitals/Pain Pain Assessment: No/denies pain    Home Living                      Prior Function            PT Goals (current goals can now be found in the care plan section) Acute Rehab PT Goals Patient Stated Goal: "walk" PT Goal Formulation: With patient Time For Goal Achievement: 12/05/20 Potential to Achieve Goals: Good    Frequency  Min 3X/week      PT Plan      Co-evaluation              AM-PAC PT "6 Clicks" Mobility   Outcome Measure  Help needed turning from your back to your side while in a flat bed without using bedrails?: None Help needed moving from lying on your back to sitting on the side of a flat bed without using bedrails?: None Help needed moving to and from a bed to a chair (including a wheelchair)?: None Help needed standing up from a chair using your arms (e.g., wheelchair or bedside chair)?: None Help needed to walk in hospital room?: A Little Help needed climbing 3-5 steps with a railing? : A Little 6 Click Score: 22    End of Session Equipment  Utilized During Treatment: Gait belt Activity Tolerance: Patient tolerated treatment well;Patient limited by fatigue Patient left: with call bell/phone within reach;in bed Nurse Communication: Mobility status PT Visit Diagnosis: Other abnormalities of gait and mobility (R26.89)     Time: 0950-1005 PT Time Calculation (min) (ACUTE ONLY): 15 min  Charges:  $Therapeutic Activity: 8-22 mins                     10:25 AM, 11/27/20 Josue Hector PT DPT  Physical Therapist with [facility]  [phone]

## 2020-11-27 NOTE — Progress Notes (Signed)
Progress Note  Patient Name: Julia Hart Date of Encounter: 11/27/2020  Primary Cardiologist: Dr. Sabra Heck [facility])  Subjective   Work with PT this morning.  Reports overall improved shortness of breath and leg swelling, no chest pain.  Inpatient Medications    Scheduled Meds: . apixaban  2.5 mg Oral BID  . aspirin EC  81 mg Oral Q breakfast  . atorvastatin  20 mg Oral QHS  . feeding supplement  237 mL Oral BID  . furosemide  80 mg Intravenous BID  . hydrALAZINE  10 mg Oral BID  . insulin aspart  0-15 Units Subcutaneous TID WC  . insulin aspart  0-5 Units Subcutaneous QHS  . isosorbide dinitrate  5 mg Oral BID  . loratadine  10 mg Oral Daily  . metoprolol succinate  25 mg Oral Daily  . polyethylene glycol  17 g Oral Daily  . sacubitril-valsartan  0.5 tablet Oral BID  . senna  1 tablet Oral Daily    PRN Meds: acetaminophen **OR** acetaminophen, camphor-menthol, ondansetron **OR** ondansetron (ZOFRAN) IV, oxyCODONE, polyethylene glycol   Vital Signs    Vitals:   11/27/20 0225 11/27/20 0435 11/27/20 0500 11/27/20 0929  BP: (!) 114/57 (!) 128/43  (!) 121/40  Pulse: 61 63  67  Resp: 20 18    Temp:  99.1 F (37.3 C)    TempSrc:  Oral    SpO2: 98% 100%    Weight:   65.6 kg     Intake/Output Summary (Last 24 hours) at 11/27/2020 1000 Last data filed at 11/27/2020 0900 Gross per 24 hour  Intake 560 ml  Output --  Net 560 ml   Filed Weights   11/25/20 0600 11/26/20 0418 11/27/20 0500  Weight: 65 kg 70 kg 65.6 kg    Telemetry    Atrial fibrillation.  Personally reviewed.  ECG    No ECG reviewed.  Physical Exam   GEN:  Elderly woman.  No acute distress.   Neck: No JVD. Cardiac:  Irregularly irregular, 2/6 systolic murmur, no gallop.  Respiratory: Nonlabored. Clear to auscultation bilaterally. GI: Soft, nontender, bowel sounds present. MS: 1+ leg edema; No deformity.  Labs    Chemistry Recent Labs  Lab 11/20/20 1840 11/21/20 [id number] 11/21/20 1630  11/23/20 0548 11/24/20 0602 11/25/20 0528 11/26/20 0411 11/27/20 0439  NA 132* 132*   < > 136   < > 134* 132* 137  K 3.5 3.1*   < > 4.2   < > 4.1 4.2 4.4  CL 100 99   < > 100   < > 98 96* 99  CO2 23 23   < > 27   < > 26 31 29   GLUCOSE 99 112*   < > 85   < > 77 92 86  BUN 50* 49*   < > 59*   < > 70* 70* 72*  CREATININE 1.74* 1.68*   < > 1.82*   < > 1.83* 1.66* 1.70*  CALCIUM 8.7* 8.7*   < > 8.9   < > 8.8* 8.4* 8.9  PROT 7.6 7.5  --  6.9  --   --   --   --   ALBUMIN 3.3* 3.4*  --  3.4*  --   --   --   --   AST 27 24  --  26  --   --   --   --   ALT 12 10  --  11  --   --   --   --  ALKPHOS 76 73  --  65  --   --   --   --   BILITOT 0.7 0.9  --  0.6  --   --   --   --   GFRNONAA 27* 28*   < > 26*   < > 26* 29* 28*  ANIONGAP 9 10   < > 9   < > 10 5 9    < > = values in this interval not displayed.     Hematology Recent Labs  Lab 11/21/20 0213 11/23/20 0548 11/25/20 0528  WBC 3.3* 4.0 3.7*  RBC 3.38* 3.09* 3.02*  HGB 9.7* 8.9* 8.5*  HCT 29.8* 27.3* 26.1*  MCV 88.2 88.3 86.4  MCH 28.7 28.8 28.1  MCHC 32.6 32.6 32.6  RDW 15.5 15.3 15.5  PLT 128* 114* 103*    Cardiac Enzymes Recent Labs  Lab 11/20/20 1840 11/20/20 2013 11/21/20 0213  TROPONINIHS 90* 101* 93*    BNP Recent Labs  Lab 11/20/20 1840 11/23/20 0548  BNP >4,500.0* >4,500.0*     Radiology    ECHOCARDIOGRAM COMPLETE  Result Date: 11/25/2020    ECHOCARDIOGRAM REPORT   Patient Name:   TYRAH BROERS Date of Exam: 11/25/2020 Medical Rec #:  [id number]     Height:       66.0 in Accession #:    [id number]    Weight:       143.3 lb Date of Birth:  08-02-1926     BSA:          1.736 m Patient Age:    87 years      BP:           134/45 mmHg Patient Gender: F             HR:           83 bpm. Exam Location:  Inpatient Procedure: 2D Echo, Cardiac Doppler and Color Doppler Indications:    Dyspnea R06.00  History:        Patient has prior history of Echocardiogram examinations, most                 recent 08/04/2020. CAD;  Risk Factors:Hypertension and                 Dyslipidemia.  Sonographer:    Tiffany Dance Referring Phys: [id number] [place]  1. Left ventricular ejection fraction, by estimation, is 55%. The left ventricle has normal function. The left ventricle has no regional wall motion abnormalities. There is severe left ventricular hypertrophy. Left ventricular diastolic parameters are indeterminate.  2. Right ventricular systolic function is severely reduced. The right ventricular size is moderately enlarged. The estimated right ventricular systolic pressure is 30.8 mmHg secondary to annular dilation and lack of TV coaptation, TR velocity is low.  3. Left atrial size was severely dilated.  4. Right atrial size was severely dilated.  5. A small pericardial effusion is present.  6. The mitral valve is degenerative. Mild to moderate mitral valve regurgitation. No evidence of mitral stenosis.  7. Tricuspid valve regurgitation is severe.  8. The aortic valve is abnormal. There is severe calcifcation of the aortic valve. Aortic valve regurgitation is moderate. Mild aortic valve stenosis. Aortic valve mean gradient measures 12.8 mmHg.  9. The inferior vena cava is dilated in size with <50% respiratory variability, suggesting right atrial pressure of 15 mmHg. FINDINGS  Left Ventricle: Left ventricular ejection fraction, by estimation, is 55%. The left ventricle has  normal function. The left ventricle has no regional wall motion abnormalities. The left ventricular internal cavity size was normal in size. There is severe left ventricular hypertrophy. Left ventricular diastolic parameters are indeterminate. Right Ventricle: The right ventricular size is moderately enlarged. Right vetricular wall thickness was not well visualized. Right ventricular systolic function is severely reduced. The tricuspid regurgitant velocity is 1.25 m/s, and with an assumed right atrial pressure of 15 mmHg, the estimated right ventricular  systolic pressure is 24.5 mmHg. Left Atrium: Left atrial size was severely dilated. Right Atrium: Right atrial size was severely dilated. Pericardium: A small pericardial effusion is present. Mitral Valve: The mitral valve is degenerative in appearance. Mild mitral annular calcification. Mild to moderate mitral valve regurgitation. No evidence of mitral valve stenosis. Tricuspid Valve: The tricuspid valve is grossly normal. Tricuspid valve regurgitation is severe. Aortic Valve: The aortic valve is abnormal. There is severe calcifcation of the aortic valve. Aortic valve regurgitation is moderate. Aortic regurgitation PHT measures 341 msec. Mild aortic stenosis is present. Aortic valve mean gradient measures 12.8 mmHg. Aortic valve peak gradient measures 24.0 mmHg. Aortic valve area, by VTI measures 1.15 cm. Pulmonic Valve: The pulmonic valve was grossly normal. Pulmonic valve regurgitation is mild to moderate. Aorta: The aortic root and ascending aorta are structurally normal, with no evidence of dilitation. Venous: The inferior vena cava is dilated in size with less than 50% respiratory variability, suggesting right atrial pressure of 15 mmHg. IAS/Shunts: No atrial level shunt detected by color flow Doppler.  LEFT VENTRICLE PLAX 2D LVIDd:         3.98 cm LVIDs:         2.73 cm LV PW:         1.76 cm LV IVS:        1.58 cm LVOT diam:     2.00 cm LV SV:         68 LV SV Index:   39 LVOT Area:     3.14 cm  RIGHT VENTRICLE RV Basal diam:  3.54 cm RV Mid diam:    2.78 cm RV S prime:     5.61 cm/s TAPSE (M-mode): 0.7 cm LEFT ATRIUM              Index       RIGHT ATRIUM           Index LA diam:        4.80 cm  2.77 cm/m  RA Area:     25.10 cm LA Vol (A2C):   110.0 ml 63.38 ml/m RA Volume:   88.60 ml  51.05 ml/m LA Vol (A4C):   93.3 ml  53.76 ml/m LA Biplane Vol: 102.0 ml 58.77 ml/m  AORTIC VALVE AV Area (Vmax):    1.10 cm AV Area (Vmean):   1.10 cm AV Area (VTI):     1.15 cm AV Vmax:           244.80 cm/s AV  Vmean:          172.600 cm/s AV VTI:            0.593 m AV Peak Grad:      24.0 mmHg AV Mean Grad:      12.8 mmHg LVOT Vmax:         85.35 cm/s LVOT Vmean:        60.200 cm/s LVOT VTI:          0.217 m LVOT/AV VTI ratio: 0.37 AI PHT:  341 msec  AORTA Ao Root diam: 2.80 cm Ao Asc diam:  2.80 cm MITRAL VALVE               TRICUSPID VALVE MV Area (PHT): 3.66 cm    TR Peak grad:   6.2 mmHg MV Decel Time: 207 msec    TR Vmax:        125.00 cm/s MV E velocity: 88.80 cm/s MV A velocity: 53.30 cm/s  SHUNTS MV E/A ratio:  1.67        Systemic VTI:  0.22 m                            Systemic Diam: 2.00 cm Cherlynn Kaiser MD Electronically signed by Cherlynn Kaiser MD Signature Date/Time: 11/25/2020/3:17:27 PM    Final     Cardiac Studies   Echocardiogram 11/25/2020: 1. Left ventricular ejection fraction, by estimation, is 55%. The left  ventricle has normal function. The left ventricle has no regional wall  motion abnormalities. There is severe left ventricular hypertrophy. Left  ventricular diastolic parameters are  indeterminate.  2. Right ventricular systolic function is severely reduced. The right  ventricular size is moderately enlarged. The estimated right ventricular  systolic pressure is 40.9 mmHg secondary to annular dilation and lack of  TV coaptation, TR velocity is low.  3. Left atrial size was severely dilated.  4. Right atrial size was severely dilated.  5. A small pericardial effusion is present.  6. The mitral valve is degenerative. Mild to moderate mitral valve  regurgitation. No evidence of mitral stenosis.  7. Tricuspid valve regurgitation is severe.  8. The aortic valve is abnormal. There is severe calcifcation of the  aortic valve. Aortic valve regurgitation is moderate. Mild aortic valve  stenosis. Aortic valve mean gradient measures 12.8 mmHg.  9. The inferior vena cava is dilated in size with <50% respiratory  variability, suggesting right atrial pressure of 15  mmHg.   Patient Profile     87 y.o. female with a history of ischemic cardiomyopathy, possible cardiac amyloidosis, CAD status post CABG, atrial fibrillation, hypertension, hyperlipidemia, osteoporosis, GERD, CKD with chronic anemia, now presenting with acute on chronic diastolic heart failure and RV dysfunction.  Assessment & Plan    1.  Acute on chronic diastolic heart failure and severe RV dysfunction with severe tricuspid regurgitation.  Symptomatically improved with diuresis, total urine output incomplete.  Degree of renal insufficiency relatively stable on Lasix 80 mg IV twice daily.  2.  Persistent atrial fibrillation.  CHA2DS2-VASc score is 6.  She is on Eliquis 2.5 mg twice daily for stroke prophylaxis.  3.  CAD status post CABG.  No active angina at this time and relatively flat low-level elevation in high-sensitivity troponin I.  She has been on low-dose aspirin per her outpatient cardiologist along with Eliquis.  She has a history of ischemic cardiomyopathy, however follow-up echocardiogram now shows LVEF approximately 55%.  4.  Essential hypertension, recent blood pressure is low normal.  5.  CKD stage IIIb, creatinine 1.7.  Continue current dose of Lasix.  Discontinue hydralazine and Isordil for now.  Try and keep on Toprol-XL and Entresto however.  Would not push for SGLT2 inhibitor now with current degree of renal insufficiency and requiring diuretics.  Looks like most of the issue now is right-sided heart failure and determining reasonable outpatient diuretic regimen.  Signed, Rozann Lesches, MD  11/27/2020, 10:00 AM

## 2020-11-27 NOTE — Progress Notes (Signed)
PROGRESS NOTE  Julia Hart [id number] DOB: [birth year] DOA: 11/20/2020 PCP: [name], MD   Brief History: 87 year old female with a history of coronary artery disease/NSTEMI, hypertension, hyperlipidemia, ischemic cardiomyopathy with previous EF 35% presenting with 1 week history of shortness of breath, orthopnea,and worsening lower extremity edema.She was started on IV lasix with good clinical improvement. Cardiology was consulted to assist. For her CKD 3, tolerance for worsening creatinine was allowed for improved euvolemia. IV lasix dosing has been gradually titrated up with relative stability of serum creatinine.  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 -[id number] echo EF 50% -08/05/20 Echo 65-70%, no WMA, mod enlarge RV, mod TR; small pericardial effusion -11/25/20 Echo--EF 55%, severe reduced RV function, severe TR -pt states dry weight ~135lbs -11/26/20 standing weight 141.2>>138.5 (5/9) -Previously had echo with a EF of 35%in [place] -Continue IV furosemide--increased to 80 mg bid -Accurate I's and O's--incomplete -disContinue isosorbide, hydralazine due to soft BPs -Continue Entresto for now  CKD stage 3b -baseline creatinine 1.3-1.6 -monitor with diuresis -will tolerated higher serum creatinine for euvolemia  Coronary artery disease -history of NSTEMI and PCI of the ostial and proximal RCA 08/27/2017 -No chest pain presently -08/27/2017 cath--RCA ostial 80% calcified stenosis;occluded SVG jump graft;first OM 70% stenosis;90% circumflex stenosis -ContinueASAand apixaban   Essential hypertension -Discontinue amlodipine hydralazine and isosorbide due to soft BPs -continue ametoprolol succinate  Hyperlipidemia -Continue statin  GERD -Continue Protonix  Chronic atrial fibrillation -Rate controlled -Continue apixaban -continue metoprolol succinate  Hypokalemia -repleted -mag 2.2  Hyponatremia -due to  CHF -improved with diuresis  Arm/Back itch -no visible rash on exam -start loratidine-->improved -start sarna>>improved -pt states it's intermittent for past one month  Abdominal Pain -CT abd/pelvis--Anasarca with ascites and generalized soft tissue edema. No focal fluid collections or pleural effusions identified;No focal hepatic abnormalities;Distal colonic diverticulosis without bowel obstruction or focal inflammation.    Status is: Inpatient  Remains inpatient appropriate because:IV treatments appropriate due to intensity of illness or inability to take PO   Dispo: The patient is from:Home Anticipated d/c is [id number] Patient currently is not medically stable to d/c. Difficult to place patient No        Family Communication:niece updatedat bedside 5/5  Consultants:cardiology  Code Status: DNR  DVT Prophylaxis:apixaban   Procedures: As Listed in Progress Note Above  Antibiotics: None    Subjective: Patient is breathing better, but still has some dyspnea on exertion.  Denies f/c, cp, n/v/d, abd pain.   Objective: Vitals:   11/27/20 0225 11/27/20 0435 11/27/20 0500 11/27/20 0929  BP: (!) 114/57 (!) 128/43  (!) 121/40  Pulse: 61 63  67  Resp: 20 18    Temp:  99.1 F (37.3 C)    TempSrc:  Oral    SpO2: 98% 100%    Weight:   65.6 kg     Intake/Output Summary (Last 24 hours) at 11/27/2020 1111 Last data filed at 11/27/2020 0900 Gross per 24 hour  Intake 560 ml  Output --  Net 560 ml   Weight change: -4.4 kg Exam:   General:  Pt is alert, follows commands appropriately, not in acute distress  HEENT: No icterus, No thrush, No neck mass, [place]/AT  Cardiovascular: RRR, S1/S2, no rubs, no gallops  Respiratory: bibasilar rales. No wheeze  Abdomen: Soft/+BS, non tender, non distended, no guarding  Extremities:1+ LE edema, No lymphangitis, No petechiae, No rashes, no  synovitis   Data Reviewed: I have personally reviewed following labs  and imaging studies Basic Metabolic Panel: Recent Labs  Lab 11/21/20 0213 11/21/20 1630 11/23/20 0548 11/24/20 0602 11/25/20 0528 11/26/20 0411 11/27/20 0439  NA 132*   < > 136 135 134* 132* 137  K 3.1*   < > 4.2 4.2 4.1 4.2 4.4  CL 99   < > 100 101 98 96* 99  CO2 23   < > 27 26 26 31 29   GLUCOSE 112*   < > 85 90 77 92 86  BUN 49*   < > 59* 64* 70* 70* 72*  CREATININE 1.68*   < > 1.82* 1.99* 1.83* 1.66* 1.70*  CALCIUM 8.7*   < > 8.9 8.7* 8.8* 8.4* 8.9  MG 1.8  --  2.2  --  2.1 2.2  --    < > = values in this interval not displayed.   Liver Function Tests: Recent Labs  Lab 11/20/20 1840 11/21/20 0213 11/23/20 0548  AST 27 24 26   ALT 12 10 11   ALKPHOS 76 73 65  BILITOT 0.7 0.9 0.6  PROT 7.6 7.5 6.9  ALBUMIN 3.3* 3.4* 3.4*   No results for input(s): LIPASE, AMYLASE in the last 168 hours. No results for input(s): AMMONIA in the last 168 hours. Coagulation Profile: No results for input(s): INR, PROTIME in the last 168 hours. CBC: Recent Labs  Lab 11/20/20 1840 11/21/20 0213 11/23/20 0548 11/25/20 0528  WBC 3.1* 3.3* 4.0 3.7*  NEUTROABS 1.4* 1.5*  --   --   HGB 9.4* 9.7* 8.9* 8.5*  HCT 28.8* 29.8* 27.3* 26.1*  MCV 87.3 88.2 88.3 86.4  PLT 136* 128* 114* 103*   Cardiac Enzymes: No results for input(s): CKTOTAL, CKMB, CKMBINDEX, TROPONINI in the last 168 hours. BNP: Invalid input(s): POCBNP CBG: Recent Labs  Lab 11/26/20 0714 11/26/20 1133 11/26/20 1609 11/26/20 2242 11/27/20 0714  GLUCAP 88 125* 127* 89 92   HbA1C: No results for input(s): HGBA1C in the last 72 hours. Urine analysis: No results found for: COLORURINE, APPEARANCEUR, LABSPEC, PHURINE, GLUCOSEU, HGBUR, BILIRUBINUR, KETONESUR, PROTEINUR, UROBILINOGEN, NITRITE, LEUKOCYTESUR Sepsis Labs: @LABRCNTIP (procalcitonin:4,lacticidven:4) ) Recent Results (from the past 240 hour(s))  Resp Panel by RT-PCR (Flu A&B, Covid)  Nasopharyngeal Swab     Status: None   Collection Time: 11/20/20  6:21 PM   Specimen: Nasopharyngeal Swab; Nasopharyngeal(NP) swabs in vial transport medium  Result Value Ref Range Status   SARS Coronavirus 2 by RT PCR NEGATIVE NEGATIVE Final    Comment: (NOTE) SARS-CoV-2 target nucleic acids are NOT DETECTED.  The SARS-CoV-2 RNA is generally detectable in upper respiratory specimens during the acute phase of infection. The lowest concentration of SARS-CoV-2 viral copies this assay can detect is 138 copies/mL. A negative result does not preclude SARS-Cov-2 infection and should not be used as the sole basis for treatment or other patient management decisions. A negative result may occur with  improper specimen collection/handling, submission of specimen other than nasopharyngeal swab, presence of viral mutation(s) within the areas targeted by this assay, and inadequate number of viral copies(<138 copies/mL). A negative result must be combined with clinical observations, patient history, and epidemiological information. The expected result is Negative.  Fact Sheet for Patients:  EntrepreneurPulse.com.au  Fact Sheet for Healthcare Providers:  IncredibleEmployment.be  This test is no t yet approved or cleared by the [place] FDA and  has been authorized for detection and/or diagnosis of SARS-CoV-2 by FDA under an Emergency Use Authorization (EUA). This EUA will remain  in effect (meaning this test can be  used) for the duration of the COVID-19 declaration under Section 564(b)(1) of the Act, 21 U.S.C.section 360bbb-3(b)(1), unless the authorization is terminated  or revoked sooner.       Influenza A by PCR NEGATIVE NEGATIVE Final   Influenza B by PCR NEGATIVE NEGATIVE Final    Comment: (NOTE) The Xpert Xpress SARS-CoV-2/FLU/RSV plus assay is intended as an aid in the diagnosis of influenza from Nasopharyngeal swab specimens and should not be  used as a sole basis for treatment. Nasal washings and aspirates are unacceptable for Xpert Xpress SARS-CoV-2/FLU/RSV testing.  Fact Sheet for Patients: EntrepreneurPulse.com.au  Fact Sheet for Healthcare Providers: IncredibleEmployment.be  This test is not yet approved or cleared by the [place] FDA and has been authorized for detection and/or diagnosis of SARS-CoV-2 by FDA under an Emergency Use Authorization (EUA). This EUA will remain in effect (meaning this test can be used) for the duration of the COVID-19 declaration under Section 564(b)(1) of the Act, 21 U.S.C. section 360bbb-3(b)(1), unless the authorization is terminated or revoked.  Performed at [facility], [address] [place], [place] [address]      Scheduled Meds: . apixaban  2.5 mg Oral BID  . aspirin EC  81 mg Oral Q breakfast  . atorvastatin  20 mg Oral QHS  . feeding supplement  237 mL Oral BID  . furosemide  80 mg Intravenous BID  . insulin aspart  0-15 Units Subcutaneous TID WC  . insulin aspart  0-5 Units Subcutaneous QHS  . loratadine  10 mg Oral Daily  . metoprolol succinate  25 mg Oral Daily  . polyethylene glycol  17 g Oral Daily  . sacubitril-valsartan  0.5 tablet Oral BID  . senna  1 tablet Oral Daily   Continuous Infusions:  Procedures/Studies: CT ABDOMEN PELVIS WO CONTRAST  Result Date: 11/24/2020 CLINICAL DATA:  Abdominal distension. Acute nonlocalized abdominal pain. EXAM: CT ABDOMEN AND PELVIS WITHOUT CONTRAST TECHNIQUE: Multidetector CT imaging of the abdomen and pelvis was performed following the standard protocol without IV contrast. COMPARISON:  None. FINDINGS: Lower chest: There multiple prominent perihilar air cysts bilaterally, only some of which are related to dilated central bronchi. There is an irregular nodule peripherally in the left upper lobe measuring 1.6 x 0.7 cm on image 19/4. The lung bases are otherwise clear. No significant pleural  or pericardial effusion. Patient is status post median sternotomy, CABG and probable aortic grafting. There is diffuse aortic and coronary artery atherosclerosis. The heart is mildly enlarged. Hepatobiliary: No focal hepatic abnormalities are identified on noncontrast imaging. No definite signs of cirrhosis. There is possible mild periportal edema. No evidence of gallstones, gallbladder wall thickening or biliary dilatation. Pancreas: Unremarkable. No pancreatic ductal dilatation or surrounding inflammatory changes. Spleen: Normal in size without focal abnormality. Adrenals/Urinary Tract: Both adrenal glands appear normal. There are renal vascular calcifications bilaterally without definite urinary tract calculi. Bilateral renal cysts, measuring up to 4.4 cm in the upper pole of the left kidney. No hydronephrosis. The bladder is nearly empty without apparent abnormality. Stomach/Bowel: Enteric contrast was administered and has passed into the distal small bowel and colon. The stomach appears unremarkable for its degree of distension. No evidence of bowel wall thickening, distention or surrounding inflammatory change. There are diverticular changes of the distal colon. The appendix is not clearly visualized. Vascular/Lymphatic: Nodal assessment limited by generalized soft tissue edema and lack of contrast. No enlarged abdominopelvic lymph nodes identified. Diffuse aortic and branch vessel atherosclerosis. Reproductive: Status post hysterectomy. Probable residual ovarian  tissue bilaterally without suspicious adnexal findings. Other: There is a moderate amount of ascites throughout the peritoneal cavity. There is generalized edema throughout the subcutaneous and deep fat. No extravasated enteric contrast, focal fluid collection or extraluminal air collection identified. Musculoskeletal: Relatively mild degenerative changes throughout the spine and both hips. IMPRESSION: 1. Anasarca with ascites and generalized soft  tissue edema. No focal fluid collections or pleural effusions identified. If the etiology for the ascites is a known clinically, paracentesis should be considered. 2. Distal colonic diverticulosis without bowel obstruction or focal inflammation. 3. Cystic lung disease, in part secondary to cystic bronchiectasis. 4. Diffuse Aortic Atherosclerosis (ICD10-I70.0). Electronically Signed   By: Richardean Sale M.D.   On: 11/24/2020 21:43   DG Chest Portable 1 View  Result Date: 11/20/2020 CLINICAL DATA:  Dyspnea, COVID infection a few months prior EXAM: PORTABLE CHEST 1 VIEW COMPARISON:  09/14/2020 chest radiograph. FINDINGS: Stable configuration of median sternotomy wires with chronic lower most sternotomy wire discontinuity. Stable cardiomediastinal silhouette with moderate cardiomegaly. No pneumothorax. No pleural effusion. Cephalization of the pulmonary vasculature without overt pulmonary edema. No acute consolidative airspace disease. IMPRESSION: Moderate cardiomegaly without overt pulmonary edema. No active pulmonary disease. Electronically Signed   By: Ilona Sorrel M.D.   On: 11/20/2020 19:01   ECHOCARDIOGRAM COMPLETE  Result Date: 11/25/2020    ECHOCARDIOGRAM REPORT   Patient Name:   Julia Hart Date of Exam: 11/25/2020 Medical Rec #:  [id number]     Height:       66.0 in Accession #:    [id number]    Weight:       143.3 lb Date of Birth:  July 01, 1927     BSA:          1.736 m Patient Age:    33 years      BP:           134/45 mmHg Patient Gender: F             HR:           83 bpm. Exam Location:  Inpatient Procedure: 2D Echo, Cardiac Doppler and Color Doppler Indications:    Dyspnea R06.00  History:        Patient has prior history of Echocardiogram examinations, most                 recent 08/04/2020. CAD; Risk Factors:Hypertension and                 Dyslipidemia.  Sonographer:    Tiffany Dance Referring Phys: [id number] [place]  1. Left ventricular ejection fraction, by estimation, is  55%. The left ventricle has normal function. The left ventricle has no regional wall motion abnormalities. There is severe left ventricular hypertrophy. Left ventricular diastolic parameters are indeterminate.  2. Right ventricular systolic function is severely reduced. The right ventricular size is moderately enlarged. The estimated right ventricular systolic pressure is 0000000 mmHg secondary to annular dilation and lack of TV coaptation, TR velocity is low.  3. Left atrial size was severely dilated.  4. Right atrial size was severely dilated.  5. A small pericardial effusion is present.  6. The mitral valve is degenerative. Mild to moderate mitral valve regurgitation. No evidence of mitral stenosis.  7. Tricuspid valve regurgitation is severe.  8. The aortic valve is abnormal. There is severe calcifcation of the aortic valve. Aortic valve regurgitation is moderate. Mild aortic valve stenosis. Aortic valve mean gradient measures 12.8 mmHg.  9. The  inferior vena cava is dilated in size with <50% respiratory variability, suggesting right atrial pressure of 15 mmHg. FINDINGS  Left Ventricle: Left ventricular ejection fraction, by estimation, is 55%. The left ventricle has normal function. The left ventricle has no regional wall motion abnormalities. The left ventricular internal cavity size was normal in size. There is severe left ventricular hypertrophy. Left ventricular diastolic parameters are indeterminate. Right Ventricle: The right ventricular size is moderately enlarged. Right vetricular wall thickness was not well visualized. Right ventricular systolic function is severely reduced. The tricuspid regurgitant velocity is 1.25 m/s, and with an assumed right atrial pressure of 15 mmHg, the estimated right ventricular systolic pressure is 59.5 mmHg. Left Atrium: Left atrial size was severely dilated. Right Atrium: Right atrial size was severely dilated. Pericardium: A small pericardial effusion is present. Mitral  Valve: The mitral valve is degenerative in appearance. Mild mitral annular calcification. Mild to moderate mitral valve regurgitation. No evidence of mitral valve stenosis. Tricuspid Valve: The tricuspid valve is grossly normal. Tricuspid valve regurgitation is severe. Aortic Valve: The aortic valve is abnormal. There is severe calcifcation of the aortic valve. Aortic valve regurgitation is moderate. Aortic regurgitation PHT measures 341 msec. Mild aortic stenosis is present. Aortic valve mean gradient measures 12.8 mmHg. Aortic valve peak gradient measures 24.0 mmHg. Aortic valve area, by VTI measures 1.15 cm. Pulmonic Valve: The pulmonic valve was grossly normal. Pulmonic valve regurgitation is mild to moderate. Aorta: The aortic root and ascending aorta are structurally normal, with no evidence of dilitation. Venous: The inferior vena cava is dilated in size with less than 50% respiratory variability, suggesting right atrial pressure of 15 mmHg. IAS/Shunts: No atrial level shunt detected by color flow Doppler.  LEFT VENTRICLE PLAX 2D LVIDd:         3.98 cm LVIDs:         2.73 cm LV PW:         1.76 cm LV IVS:        1.58 cm LVOT diam:     2.00 cm LV SV:         68 LV SV Index:   39 LVOT Area:     3.14 cm  RIGHT VENTRICLE RV Basal diam:  3.54 cm RV Mid diam:    2.78 cm RV S prime:     5.61 cm/s TAPSE (M-mode): 0.7 cm LEFT ATRIUM              Index       RIGHT ATRIUM           Index LA diam:        4.80 cm  2.77 cm/m  RA Area:     25.10 cm LA Vol (A2C):   110.0 ml 63.38 ml/m RA Volume:   88.60 ml  51.05 ml/m LA Vol (A4C):   93.3 ml  53.76 ml/m LA Biplane Vol: 102.0 ml 58.77 ml/m  AORTIC VALVE AV Area (Vmax):    1.10 cm AV Area (Vmean):   1.10 cm AV Area (VTI):     1.15 cm AV Vmax:           244.80 cm/s AV Vmean:          172.600 cm/s AV VTI:            0.593 m AV Peak Grad:      24.0 mmHg AV Mean Grad:      12.8 mmHg LVOT Vmax:         85.35  cm/s LVOT Vmean:        60.200 cm/s LVOT VTI:          0.217 m  LVOT/AV VTI ratio: 0.37 AI PHT:            341 msec  AORTA Ao Root diam: 2.80 cm Ao Asc diam:  2.80 cm MITRAL VALVE               TRICUSPID VALVE MV Area (PHT): 3.66 cm    TR Peak grad:   6.2 mmHg MV Decel Time: 207 msec    TR Vmax:        125.00 cm/s MV E velocity: 88.80 cm/s MV A velocity: 53.30 cm/s  SHUNTS MV E/A ratio:  1.67        Systemic VTI:  0.22 m                            Systemic Diam: 2.00 cm Cherlynn Kaiser MD Electronically signed by Cherlynn Kaiser MD Signature Date/Time: 11/25/2020/3:17:27 PM    Final     [name], DO  [facility]  If 7PM-7AM, please contact night-coverage [email or web address] Password [email or web address] 11/27/2020, 11:11 AM   LOS: 6 days

## 2020-11-28 DIAGNOSIS — N179 Acute kidney failure, unspecified: Secondary | ICD-10-CM | POA: Diagnosis not present

## 2020-11-28 DIAGNOSIS — E871 Hypo-osmolality and hyponatremia: Secondary | ICD-10-CM | POA: Diagnosis not present

## 2020-11-28 DIAGNOSIS — I5043 Acute on chronic combined systolic (congestive) and diastolic (congestive) heart failure: Secondary | ICD-10-CM | POA: Diagnosis not present

## 2020-11-28 LAB — GLUCOSE, CAPILLARY
Glucose-Capillary: 80 mg/dL (ref 70–99)
Glucose-Capillary: 102 mg/dL — ABNORMAL HIGH (ref 70–99)
Glucose-Capillary: 126 mg/dL — ABNORMAL HIGH (ref 70–99)
Glucose-Capillary: 132 mg/dL — ABNORMAL HIGH (ref 70–99)

## 2020-11-28 LAB — BASIC METABOLIC PANEL
Anion gap: 9 (ref 5–15)
BUN: 62 mg/dL — ABNORMAL HIGH (ref 8–23)
CO2: 30 mmol/L (ref 22–32)
Calcium: 8.7 mg/dL — ABNORMAL LOW (ref 8.9–10.3)
Chloride: 96 mmol/L — ABNORMAL LOW (ref 98–111)
Creatinine, Ser: 1.41 mg/dL — ABNORMAL HIGH (ref 0.44–1.00)
GFR, Estimated: 35 mL/min — ABNORMAL LOW (ref >60)
Glucose, Bld: 88 mg/dL (ref 70–99)
Potassium: 4 mmol/L (ref 3.5–5.1)
Sodium: 135 mmol/L (ref 135–145)

## 2020-11-28 NOTE — Progress Notes (Signed)
Physical Therapy Treatment Patient Details Name: Julia Hart MRN: [id number] DOB: Jun 25, 1927 Today's Date: 11/28/2020    History of Present Illness Julia Hart is a 87 y.o. female presents with c/o dyspnea, BLE swelling, orthopnea. PMH: HTN, CAD, CHF, CABG 2008    PT Comments    Pt tolerates increased ambulation distance this session, able ot take a few steps in room without RW, but ultimately using RW for long distance. Pt cued through BLE strengthening exercises with good motor control and form. Pt limited by vague complaint of not being her "strongest" and wanting to rest. Will continue to progress acute PT as able.     Follow Up Recommendations  Home health PT;Supervision - Intermittent     Equipment Recommendations  None recommended by PT    Recommendations for Other Services       Precautions / Restrictions Precautions Precautions: Fall Restrictions Weight Bearing Restrictions: No    Mobility  Bed Mobility Overal bed mobility: Modified Independent   Transfers Overall transfer level: Modified independent Equipment used: Rolling walker (2 wheeled)   Ambulation/Gait Ambulation/Gait assistance: Supervision Gait Distance (Feet): 300 Feet Assistive device: Rolling walker (2 wheeled) Gait Pattern/deviations: Step-through pattern;Decreased stride length;Trunk flexed Gait velocity: slightly decreased   General Gait Details: trunk slightly flexed with step through gait pattern, navigates past obstacles safely, VC for RW management with backing to seated surface   Stairs             Wheelchair Mobility    Modified Rankin (Stroke Patients Only)       Balance   Sitting-balance support: Feet supported Sitting balance-Leahy Scale: Good Sitting balance - Comments: seated EOB   Standing balance support: During functional activity Standing balance-Leahy Scale: Fair Standing balance comment: static able to release UEs from RW, takes a few steps without RW,  dynamic uses RW       Cognition Arousal/Alertness: Awake/alert Behavior During Therapy: WFL for tasks assessed/performed Overall Cognitive Status: Within Functional Limits for tasks assessed       Exercises General Exercises - Lower Extremity Long Arc Quad: Seated;AROM;Strengthening;Both;20 reps Hip Flexion/Marching: Seated;AROM;Strengthening;Both;20 reps    General Comments        Pertinent Vitals/Pain Pain Assessment: No/denies pain    Home Living                      Prior Function            PT Goals (current goals can now be found in the care plan section) Acute Rehab PT Goals Patient Stated Goal: "walk" PT Goal Formulation: With patient Time For Goal Achievement: 12/05/20 Potential to Achieve Goals: Good Progress towards PT goals: Progressing toward goals    Frequency    Min 3X/week      PT Plan Current plan remains appropriate    Co-evaluation              AM-PAC PT "6 Clicks" Mobility   Outcome Measure  Help needed turning from your back to your side while in a flat bed without using bedrails?: None Help needed moving from lying on your back to sitting on the side of a flat bed without using bedrails?: None Help needed moving to and from a bed to a chair (including a wheelchair)?: None Help needed standing up from a chair using your arms (e.g., wheelchair or bedside chair)?: None Help needed to walk in hospital room?: A Little Help needed climbing 3-5 steps with a railing? : A Little 6  Click Score: 22    End of Session   Activity Tolerance: Patient tolerated treatment well Patient left: with call bell/phone within reach;in bed Nurse Communication: Mobility status PT Visit Diagnosis: Other abnormalities of gait and mobility (R26.89)     Time: 2080-2233 PT Time Calculation (min) (ACUTE ONLY): 15 min  Charges:  $Gait Training: 8-22 mins                      Tori Raekwon Winkowski PT, DPT 11/28/20, 12:21 PM

## 2020-11-28 NOTE — Progress Notes (Addendum)
PROGRESS NOTE  Julia Hart [id number] DOB: 1927/07/20 DOA: 11/20/2020 PCP: [name], MD  Brief History: 87 year old female with a history of coronary artery disease/NSTEMI, hypertension, hyperlipidemia, ischemic cardiomyopathy with previous EF 35% presenting with 1 week history of shortness of breath, orthopnea,and worsening lower extremity edema.She was started on IV lasix with good clinical improvement. Cardiology was consulted to assist. For her CKD 3, tolerance for worsening creatinine was allowed for improved euvolemia.IV lasix dosing has been gradually titrated up with relative stability of serum creatinine.  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 -[id number] echo EF 50% -08/05/20 Echo 65-70%, no WMA, mod enlarge RV, mod TR; small pericardial effusion -11/25/20 Echo--EF 55%, severe reduced RV function, severe TR -11/26/20 standing weight 141.2>>138.5 (5/9)>>132.5 (5/10) -08/07/20 discharge weight = 121.2 lbs -Previously had echo with a EF of 35%in [place] -Continue IV furosemide--increasedto 80 mg bid -Accurate I's and O's--incomplete -disContinue isosorbide, hydralazine due to soft BPs -Continue Entresto for now  CKD stage 3b -baseline creatinine 1.3-1.6 -monitor with diuresis -will tolerated higher serum creatinine for euvolemia  Coronary artery disease -history of NSTEMI and PCI of the ostial and proximal RCA 08/27/2017 -No chest pain presently -08/27/2017 cath--RCA ostial 80% calcified stenosis;occluded SVG jump graft;first OM 70% stenosis;90% circumflex stenosis -ContinueASAand apixaban   Essential hypertension -Discontinue amlodipine hydralazine and isosorbide due to soft BPs -continue metoprolol succinate  Hyperlipidemia -Continue statin  GERD -Continue Protonix  Chronic atrial fibrillation -Rate controlled -Continue apixaban -continue metoprolol  succinate  Hypokalemia -repleted -mag2.2  Hyponatremia -due to CHF -improved with diuresis  Arm/Back itch -no visible rash on exam -start loratidine-->improved -start sarna>>improved -pt states it's intermittent for past one month  Abdominal Pain -CT abd/pelvis--Anasarca with ascites and generalized soft tissue edema. No focal fluid collections or pleural effusions identified;No focal hepatic abnormalities;Distal colonic diverticulosis without bowel obstruction or focal inflammation.    Status is: Inpatient  Remains inpatient appropriate because:IV treatments appropriate due to intensity of illness or inability to take PO   Dispo: The patient is from:Home Anticipated d/c is [id number] Patient currently is not medically stable to d/c. Difficult to place patient No        Family Communication:niece updatedat bedside 5/10  Consultants:cardiology  Code Status: DNR  DVT Prophylaxis:apixaban   Procedures: As Listed in Progress Note Above  Antibiotics: None     Subjective: Patient denies fevers, chills, headache, chest pain, dyspnea, nausea, vomiting, diarrhea, abdominal pain, dysuria, hematuria, hematochezia, and melena. -she is feeling less sob with exertion   Objective: Vitals:   11/27/20 2044 11/28/20 0500 11/28/20 0502 11/28/20 1344  BP: (!) 139/46  (!) 124/50 (!) 131/39  Pulse: 99  87 (!) 47  Resp: 18  18 15   Temp: 98.2 F (36.8 C)  97.6 F (36.4 C) 98.1 F (36.7 C)  TempSrc: Oral   Oral  SpO2: 97%  95% 99%  Weight:  66.1 kg      Intake/Output Summary (Last 24 hours) at 11/28/2020 1533 Last data filed at 11/28/2020 1300 Gross per 24 hour  Intake 720 ml  Output --  Net 720 ml   Weight change: 0.5 kg Exam:   General:  Pt is alert, follows commands appropriately, not in acute distress  HEENT: No icterus, No thrush, No neck mass, [place]/AT  Cardiovascular: RRR,  S1/S2, no rubs, no gallops  Respiratory: bibasilar crackles. No wheeze  Abdomen: Soft/+BS, non tender, non distended, no guarding  Extremities: 1 + LE edema, No lymphangitis, No  petechiae, No rashes, no synovitis   Data Reviewed: I have personally reviewed following labs and imaging studies Basic Metabolic Panel: Recent Labs  Lab 11/23/20 0548 11/24/20 0602 11/25/20 0528 11/26/20 0411 11/27/20 0439 11/28/20 0636  NA 136 135 134* 132* 137 135  K 4.2 4.2 4.1 4.2 4.4 4.0  CL 100 101 98 96* 99 96*  CO2 27 26 26 31 29 30   GLUCOSE 85 90 77 92 86 88  BUN 59* 64* 70* 70* 72* 62*  CREATININE 1.82* 1.99* 1.83* 1.66* 1.70* 1.41*  CALCIUM 8.9 8.7* 8.8* 8.4* 8.9 8.7*  MG 2.2  --  2.1 2.2  --   --    Liver Function Tests: Recent Labs  Lab 11/23/20 0548  AST 26  ALT 11  ALKPHOS 65  BILITOT 0.6  PROT 6.9  ALBUMIN 3.4*   No results for input(s): LIPASE, AMYLASE in the last 168 hours. No results for input(s): AMMONIA in the last 168 hours. Coagulation Profile: No results for input(s): INR, PROTIME in the last 168 hours. CBC: Recent Labs  Lab 11/23/20 0548 11/25/20 0528  WBC 4.0 3.7*  HGB 8.9* 8.5*  HCT 27.3* 26.1*  MCV 88.3 86.4  PLT 114* 103*   Cardiac Enzymes: No results for input(s): CKTOTAL, CKMB, CKMBINDEX, TROPONINI in the last 168 hours. BNP: Invalid input(s): POCBNP CBG: Recent Labs  Lab 11/27/20 1113 11/27/20 1603 11/27/20 2048 11/28/20 0728 11/28/20 1128  GLUCAP 140* 105* 106* 80 102*   HbA1C: No results for input(s): HGBA1C in the last 72 hours. Urine analysis: No results found for: COLORURINE, APPEARANCEUR, LABSPEC, PHURINE, GLUCOSEU, HGBUR, BILIRUBINUR, KETONESUR, PROTEINUR, UROBILINOGEN, NITRITE, LEUKOCYTESUR Sepsis Labs: @LABRCNTIP (procalcitonin:4,lacticidven:4) ) Recent Results (from the past 240 hour(s))  Resp Panel by RT-PCR (Flu A&B, Covid) Nasopharyngeal Swab     Status: None   Collection Time: 11/20/20  6:21 PM   Specimen:  Nasopharyngeal Swab; Nasopharyngeal(NP) swabs in vial transport medium  Result Value Ref Range Status   SARS Coronavirus 2 by RT PCR NEGATIVE NEGATIVE Final    Comment: (NOTE) SARS-CoV-2 target nucleic acids are NOT DETECTED.  The SARS-CoV-2 RNA is generally detectable in upper respiratory specimens during the acute phase of infection. The lowest concentration of SARS-CoV-2 viral copies this assay can detect is 138 copies/mL. A negative result does not preclude SARS-Cov-2 infection and should not be used as the sole basis for treatment or other patient management decisions. A negative result may occur with  improper specimen collection/handling, submission of specimen other than nasopharyngeal swab, presence of viral mutation(s) within the areas targeted by this assay, and inadequate number of viral copies(<138 copies/mL). A negative result must be combined with clinical observations, patient history, and epidemiological information. The expected result is Negative.  Fact Sheet for Patients:  EntrepreneurPulse.com.au  Fact Sheet for Healthcare Providers:  IncredibleEmployment.be  This test is no t yet approved or cleared by the [place] FDA and  has been authorized for detection and/or diagnosis of SARS-CoV-2 by FDA under an Emergency Use Authorization (EUA). This EUA will remain  in effect (meaning this test can be used) for the duration of the COVID-19 declaration under Section 564(b)(1) of the Act, 21 U.S.C.section 360bbb-3(b)(1), unless the authorization is terminated  or revoked sooner.       Influenza A by PCR NEGATIVE NEGATIVE Final   Influenza B by PCR NEGATIVE NEGATIVE Final    Comment: (NOTE) The Xpert Xpress SARS-CoV-2/FLU/RSV plus assay is intended as an aid in the diagnosis of influenza from Nasopharyngeal  swab specimens and should not be used as a sole basis for treatment. Nasal washings and aspirates are unacceptable for  Xpert Xpress SARS-CoV-2/FLU/RSV testing.  Fact Sheet for Patients: EntrepreneurPulse.com.au  Fact Sheet for Healthcare Providers: IncredibleEmployment.be  This test is not yet approved or cleared by the [place] FDA and has been authorized for detection and/or diagnosis of SARS-CoV-2 by FDA under an Emergency Use Authorization (EUA). This EUA will remain in effect (meaning this test can be used) for the duration of the COVID-19 declaration under Section 564(b)(1) of the Act, 21 U.S.C. section 360bbb-3(b)(1), unless the authorization is terminated or revoked.  Performed at [facility], [address] [place], [place] [address]      Scheduled Meds: . apixaban  2.5 mg Oral BID  . aspirin EC  81 mg Oral Q breakfast  . atorvastatin  20 mg Oral QHS  . feeding supplement  237 mL Oral BID  . furosemide  80 mg Intravenous BID  . insulin aspart  0-15 Units Subcutaneous TID WC  . insulin aspart  0-5 Units Subcutaneous QHS  . loratadine  10 mg Oral Daily  . metoprolol succinate  25 mg Oral Daily  . polyethylene glycol  17 g Oral Daily  . prednisoLONE acetate  1 drop Both Eyes QODAY  . sacubitril-valsartan  0.5 tablet Oral BID  . senna  2 tablet Oral Daily   Continuous Infusions:  Procedures/Studies: CT ABDOMEN PELVIS WO CONTRAST  Result Date: 11/24/2020 CLINICAL DATA:  Abdominal distension. Acute nonlocalized abdominal pain. EXAM: CT ABDOMEN AND PELVIS WITHOUT CONTRAST TECHNIQUE: Multidetector CT imaging of the abdomen and pelvis was performed following the standard protocol without IV contrast. COMPARISON:  None. FINDINGS: Lower chest: There multiple prominent perihilar air cysts bilaterally, only some of which are related to dilated central bronchi. There is an irregular nodule peripherally in the left upper lobe measuring 1.6 x 0.7 cm on image 19/4. The lung bases are otherwise clear. No significant pleural or pericardial effusion. Patient is  status post median sternotomy, CABG and probable aortic grafting. There is diffuse aortic and coronary artery atherosclerosis. The heart is mildly enlarged. Hepatobiliary: No focal hepatic abnormalities are identified on noncontrast imaging. No definite signs of cirrhosis. There is possible mild periportal edema. No evidence of gallstones, gallbladder wall thickening or biliary dilatation. Pancreas: Unremarkable. No pancreatic ductal dilatation or surrounding inflammatory changes. Spleen: Normal in size without focal abnormality. Adrenals/Urinary Tract: Both adrenal glands appear normal. There are renal vascular calcifications bilaterally without definite urinary tract calculi. Bilateral renal cysts, measuring up to 4.4 cm in the upper pole of the left kidney. No hydronephrosis. The bladder is nearly empty without apparent abnormality. Stomach/Bowel: Enteric contrast was administered and has passed into the distal small bowel and colon. The stomach appears unremarkable for its degree of distension. No evidence of bowel wall thickening, distention or surrounding inflammatory change. There are diverticular changes of the distal colon. The appendix is not clearly visualized. Vascular/Lymphatic: Nodal assessment limited by generalized soft tissue edema and lack of contrast. No enlarged abdominopelvic lymph nodes identified. Diffuse aortic and branch vessel atherosclerosis. Reproductive: Status post hysterectomy. Probable residual ovarian tissue bilaterally without suspicious adnexal findings. Other: There is a moderate amount of ascites throughout the peritoneal cavity. There is generalized edema throughout the subcutaneous and deep fat. No extravasated enteric contrast, focal fluid collection or extraluminal air collection identified. Musculoskeletal: Relatively mild degenerative changes throughout the spine and both hips. IMPRESSION: 1. Anasarca with ascites and generalized soft tissue edema.  No focal fluid  collections or pleural effusions identified. If the etiology for the ascites is a known clinically, paracentesis should be considered. 2. Distal colonic diverticulosis without bowel obstruction or focal inflammation. 3. Cystic lung disease, in part secondary to cystic bronchiectasis. 4. Diffuse Aortic Atherosclerosis (ICD10-I70.0). Electronically Signed   By: Richardean Sale M.D.   On: 11/24/2020 21:43   DG Chest Portable 1 View  Result Date: 11/20/2020 CLINICAL DATA:  Dyspnea, COVID infection a few months prior EXAM: PORTABLE CHEST 1 VIEW COMPARISON:  09/14/2020 chest radiograph. FINDINGS: Stable configuration of median sternotomy wires with chronic lower most sternotomy wire discontinuity. Stable cardiomediastinal silhouette with moderate cardiomegaly. No pneumothorax. No pleural effusion. Cephalization of the pulmonary vasculature without overt pulmonary edema. No acute consolidative airspace disease. IMPRESSION: Moderate cardiomegaly without overt pulmonary edema. No active pulmonary disease. Electronically Signed   By: Ilona Sorrel M.D.   On: 11/20/2020 19:01   ECHOCARDIOGRAM COMPLETE  Result Date: 11/25/2020    ECHOCARDIOGRAM REPORT   Patient Name:   MERLYN BOLLEN Date of Exam: 11/25/2020 Medical Rec #:  [id number]     Height:       66.0 in Accession #:    [id number]    Weight:       143.3 lb Date of Birth:  1926/11/04     BSA:          1.736 m Patient Age:    53 years      BP:           134/45 mmHg Patient Gender: F             HR:           83 bpm. Exam Location:  Inpatient Procedure: 2D Echo, Cardiac Doppler and Color Doppler Indications:    Dyspnea R06.00  History:        Patient has prior history of Echocardiogram examinations, most                 recent 08/04/2020. CAD; Risk Factors:Hypertension and                 Dyslipidemia.  Sonographer:    Tiffany Dance Referring Phys: [id number] [place]  1. Left ventricular ejection fraction, by estimation, is 55%. The left ventricle has  normal function. The left ventricle has no regional wall motion abnormalities. There is severe left ventricular hypertrophy. Left ventricular diastolic parameters are indeterminate.  2. Right ventricular systolic function is severely reduced. The right ventricular size is moderately enlarged. The estimated right ventricular systolic pressure is 67.1 mmHg secondary to annular dilation and lack of TV coaptation, TR velocity is low.  3. Left atrial size was severely dilated.  4. Right atrial size was severely dilated.  5. A small pericardial effusion is present.  6. The mitral valve is degenerative. Mild to moderate mitral valve regurgitation. No evidence of mitral stenosis.  7. Tricuspid valve regurgitation is severe.  8. The aortic valve is abnormal. There is severe calcifcation of the aortic valve. Aortic valve regurgitation is moderate. Mild aortic valve stenosis. Aortic valve mean gradient measures 12.8 mmHg.  9. The inferior vena cava is dilated in size with <50% respiratory variability, suggesting right atrial pressure of 15 mmHg. FINDINGS  Left Ventricle: Left ventricular ejection fraction, by estimation, is 55%. The left ventricle has normal function. The left ventricle has no regional wall motion abnormalities. The left ventricular internal cavity size was normal in size. There is severe left ventricular hypertrophy.  Left ventricular diastolic parameters are indeterminate. Right Ventricle: The right ventricular size is moderately enlarged. Right vetricular wall thickness was not well visualized. Right ventricular systolic function is severely reduced. The tricuspid regurgitant velocity is 1.25 m/s, and with an assumed right atrial pressure of 15 mmHg, the estimated right ventricular systolic pressure is 21.2 mmHg. Left Atrium: Left atrial size was severely dilated. Right Atrium: Right atrial size was severely dilated. Pericardium: A small pericardial effusion is present. Mitral Valve: The mitral valve is  degenerative in appearance. Mild mitral annular calcification. Mild to moderate mitral valve regurgitation. No evidence of mitral valve stenosis. Tricuspid Valve: The tricuspid valve is grossly normal. Tricuspid valve regurgitation is severe. Aortic Valve: The aortic valve is abnormal. There is severe calcifcation of the aortic valve. Aortic valve regurgitation is moderate. Aortic regurgitation PHT measures 341 msec. Mild aortic stenosis is present. Aortic valve mean gradient measures 12.8 mmHg. Aortic valve peak gradient measures 24.0 mmHg. Aortic valve area, by VTI measures 1.15 cm. Pulmonic Valve: The pulmonic valve was grossly normal. Pulmonic valve regurgitation is mild to moderate. Aorta: The aortic root and ascending aorta are structurally normal, with no evidence of dilitation. Venous: The inferior vena cava is dilated in size with less than 50% respiratory variability, suggesting right atrial pressure of 15 mmHg. IAS/Shunts: No atrial level shunt detected by color flow Doppler.  LEFT VENTRICLE PLAX 2D LVIDd:         3.98 cm LVIDs:         2.73 cm LV PW:         1.76 cm LV IVS:        1.58 cm LVOT diam:     2.00 cm LV SV:         68 LV SV Index:   39 LVOT Area:     3.14 cm  RIGHT VENTRICLE RV Basal diam:  3.54 cm RV Mid diam:    2.78 cm RV S prime:     5.61 cm/s TAPSE (M-mode): 0.7 cm LEFT ATRIUM              Index       RIGHT ATRIUM           Index LA diam:        4.80 cm  2.77 cm/m  RA Area:     25.10 cm LA Vol (A2C):   110.0 ml 63.38 ml/m RA Volume:   88.60 ml  51.05 ml/m LA Vol (A4C):   93.3 ml  53.76 ml/m LA Biplane Vol: 102.0 ml 58.77 ml/m  AORTIC VALVE AV Area (Vmax):    1.10 cm AV Area (Vmean):   1.10 cm AV Area (VTI):     1.15 cm AV Vmax:           244.80 cm/s AV Vmean:          172.600 cm/s AV VTI:            0.593 m AV Peak Grad:      24.0 mmHg AV Mean Grad:      12.8 mmHg LVOT Vmax:         85.35 cm/s LVOT Vmean:        60.200 cm/s LVOT VTI:          0.217 m LVOT/AV VTI ratio: 0.37 AI  PHT:            341 msec  AORTA Ao Root diam: 2.80 cm Ao Asc diam:  2.80 cm MITRAL VALVE  TRICUSPID VALVE MV Area (PHT): 3.66 cm    TR Peak grad:   6.2 mmHg MV Decel Time: 207 msec    TR Vmax:        125.00 cm/s MV E velocity: 88.80 cm/s MV A velocity: 53.30 cm/s  SHUNTS MV E/A ratio:  1.67        Systemic VTI:  0.22 m                            Systemic Diam: 2.00 cm Cherlynn Kaiser MD Electronically signed by Cherlynn Kaiser MD Signature Date/Time: 11/25/2020/3:17:27 PM    Final     [name], DO  [facility]  If 7PM-7AM, please contact night-coverage [email or web address] Password [facility] 11/28/2020, 3:33 PM   LOS: 7 days

## 2020-11-28 NOTE — Progress Notes (Signed)
Progress Note  Patient Name: Julia Hart Date of Encounter: 11/28/2020  Primary Cardiologist: Dr. Sabra Heck [facility])  Subjective   Overall improved. Leg swelling is better. No chest pain.  Inpatient Medications    Scheduled Meds: . apixaban  2.5 mg Oral BID  . aspirin EC  81 mg Oral Q breakfast  . atorvastatin  20 mg Oral QHS  . feeding supplement  237 mL Oral BID  . furosemide  80 mg Intravenous BID  . insulin aspart  0-15 Units Subcutaneous TID WC  . insulin aspart  0-5 Units Subcutaneous QHS  . loratadine  10 mg Oral Daily  . metoprolol succinate  25 mg Oral Daily  . polyethylene glycol  17 g Oral Daily  . prednisoLONE acetate  1 drop Both Eyes QODAY  . sacubitril-valsartan  0.5 tablet Oral BID  . senna  2 tablet Oral Daily    PRN Meds: acetaminophen **OR** acetaminophen, camphor-menthol, ondansetron **OR** ondansetron (ZOFRAN) IV, oxyCODONE, polyethylene glycol   Vital Signs    Vitals:   11/27/20 1309 11/27/20 2044 11/28/20 0500 11/28/20 0502  BP: (!) 149/48 (!) 139/46  (!) 124/50  Pulse: 86 99  87  Resp: 20 18  18   Temp: 98.2 F (36.8 C) 98.2 F (36.8 C)  97.6 F (36.4 C)  TempSrc: Oral Oral    SpO2: 99% 97%  95%  Weight:   66.1 kg     Intake/Output Summary (Last 24 hours) at 11/28/2020 0832 Last data filed at 11/27/2020 1700 Gross per 24 hour  Intake 720 ml  Output --  Net 720 ml   Filed Weights   11/26/20 0418 11/27/20 0500 11/28/20 0500  Weight: 70 kg 65.6 kg 66.1 kg    Telemetry    Sinus rhythm, rate 90s.  Personally reviewed.  ECG    No ECG reviewed.  Physical Exam   GEN:  Elderly woman.  No acute distress.   Neck: No JVD. Cardiac: regular rhythm, 2/6 systolic murmur, no gallop.  Respiratory: Nonlabored. Clear to auscultation bilaterally. GI: Soft, nontender, bowel sounds present. MS: trace leg edema; No deformity.  Labs    Chemistry Recent Labs  Lab 11/23/20 0548 11/24/20 0602 11/26/20 0411 11/27/20 0439 11/28/20 0636   NA 136   < > 132* 137 135  K 4.2   < > 4.2 4.4 4.0  CL 100   < > 96* 99 96*  CO2 27   < > 31 29 30   GLUCOSE 85   < > 92 86 88  BUN 59*   < > 70* 72* 62*  CREATININE 1.82*   < > 1.66* 1.70* 1.41*  CALCIUM 8.9   < > 8.4* 8.9 8.7*  PROT 6.9  --   --   --   --   ALBUMIN 3.4*  --   --   --   --   AST 26  --   --   --   --   ALT 11  --   --   --   --   ALKPHOS 65  --   --   --   --   BILITOT 0.6  --   --   --   --   GFRNONAA 26*   < > 29* 28* 35*  ANIONGAP 9   < > 5 9 9    < > = values in this interval not displayed.     Hematology Recent Labs  Lab 11/23/20 0548 11/25/20 0528  WBC 4.0 3.7*  RBC 3.09* 3.02*  HGB 8.9* 8.5*  HCT 27.3* 26.1*  MCV 88.3 86.4  MCH 28.8 28.1  MCHC 32.6 32.6  RDW 15.3 15.5  PLT 114* 103*    Cardiac Enzymes Recent Labs  Lab 11/20/20 1840 11/20/20 2013 11/21/20 0213  TROPONINIHS 90* 101* 93*    BNP Recent Labs  Lab 11/23/20 0548  BNP >4,500.0*     Radiology    No results found.  Cardiac Studies   Echocardiogram 11/25/2020: 1. Left ventricular ejection fraction, by estimation, is 55%. The left  ventricle has normal function. The left ventricle has no regional wall  motion abnormalities. There is severe left ventricular hypertrophy. Left  ventricular diastolic parameters are  indeterminate.  2. Right ventricular systolic function is severely reduced. The right  ventricular size is moderately enlarged. The estimated right ventricular  systolic pressure is 16.1 mmHg secondary to annular dilation and lack of  TV coaptation, TR velocity is low.  3. Left atrial size was severely dilated.  4. Right atrial size was severely dilated.  5. A small pericardial effusion is present.  6. The mitral valve is degenerative. Mild to moderate mitral valve  regurgitation. No evidence of mitral stenosis.  7. Tricuspid valve regurgitation is severe.  8. The aortic valve is abnormal. There is severe calcifcation of the  aortic valve. Aortic  valve regurgitation is moderate. Mild aortic valve  stenosis. Aortic valve mean gradient measures 12.8 mmHg.  9. The inferior vena cava is dilated in size with <50% respiratory  variability, suggesting right atrial pressure of 15 mmHg.   Patient Profile     87 y.o. female with a history of ischemic cardiomyopathy, possible cardiac amyloidosis, CAD status post CABG, atrial fibrillation, hypertension, hyperlipidemia, osteoporosis, GERD, CKD with chronic anemia, now presenting with acute on chronic diastolic heart failure and RV dysfunction.  Assessment & Plan    1.  Acute on chronic diastolic heart failure and severe RV dysfunction with severe TR.  Symptomatically improved with diuresis.Degree of renal insufficiency relatively stable on Lasix 80 mg IV twice daily.  - Off amlodipine - Off of hydralazine and Isordil due to soft BPs - Continue Toprol-XL and Entresto - No SGLT2 inhibitors (due to renal insufficiency)   2.  Paroxysmal atrial fibrillation.  CHA2DS2-VASc score is 6.  She is on Eliquis 2.5 mg twice daily for stroke prophylaxis.  3.  CAD status post CABG.  No active angina at this time and relatively flat low-level elevation in high-sensitivity troponin I.  She has been on low-dose aspirin per her outpatient cardiologist along with Eliquis.  Also on atorvastatin. She has a history of ischemic cardiomyopathy, however follow-up echocardiogram now shows LVEF approximately 55%.  4.  Essential hypertension, recent blood pressure is low normal.  5.  CKD stage IIIb, creatinine 1.41 on 5/9 (1.70 on 5/8).   Signed, Meade Maw, MD  11/28/2020, 8:32 AM

## 2020-11-29 DIAGNOSIS — I5043 Acute on chronic combined systolic (congestive) and diastolic (congestive) heart failure: Secondary | ICD-10-CM | POA: Diagnosis not present

## 2020-11-29 DIAGNOSIS — N1832 Chronic kidney disease, stage 3b: Secondary | ICD-10-CM | POA: Diagnosis not present

## 2020-11-29 DIAGNOSIS — N179 Acute kidney failure, unspecified: Secondary | ICD-10-CM | POA: Diagnosis not present

## 2020-11-29 DIAGNOSIS — R0602 Shortness of breath: Secondary | ICD-10-CM

## 2020-11-29 LAB — GLUCOSE, CAPILLARY
Glucose-Capillary: 88 mg/dL (ref 70–99)
Glucose-Capillary: 87 mg/dL (ref 70–99)

## 2020-11-29 LAB — BASIC METABOLIC PANEL
Anion gap: 10 (ref 5–15)
BUN: 67 mg/dL — ABNORMAL HIGH (ref 8–23)
CO2: 32 mmol/L (ref 22–32)
Calcium: 9 mg/dL (ref 8.9–10.3)
Chloride: 97 mmol/L — ABNORMAL LOW (ref 98–111)
Creatinine, Ser: 1.52 mg/dL — ABNORMAL HIGH (ref 0.44–1.00)
GFR, Estimated: 32 mL/min — ABNORMAL LOW (ref >60)
Glucose, Bld: 82 mg/dL (ref 70–99)
Potassium: 4.3 mmol/L (ref 3.5–5.1)
Sodium: 139 mmol/L (ref 135–145)

## 2020-11-29 LAB — MAGNESIUM: Magnesium: 2.2 mg/dL (ref 1.7–2.4)

## 2020-11-29 MED ORDER — TORSEMIDE 40 MG PO TABS: 40.0000 mg | ORAL_TABLET | Freq: Every day | ORAL | 2 refills | Status: AC

## 2020-11-29 MED ORDER — METOPROLOL SUCCINATE ER 25 MG PO TB24: 25.0000 mg | ORAL_TABLET | Freq: Every day | ORAL | 2 refills | Status: DC

## 2020-11-29 MED ORDER — TORSEMIDE 20 MG PO TABS
40.0000 mg | ORAL_TABLET | Freq: Every day | ORAL | Status: DC
  Administered 2020-11-29: 40 mg via ORAL
  Filled 2020-11-29: qty 2

## 2020-11-29 NOTE — Care Management Important Message (Signed)
Important Message  Patient Details  Name: Julia Hart MRN: [id number] Date of Birth: 1926/10/16   Medicare Important Message Given:  Yes     Tommy Medal 11/29/2020, 2:27 PM

## 2020-11-29 NOTE — Progress Notes (Signed)
IV removed and discharge instructions reviewed.  Hard script given for new meds and niece to give ride home.

## 2020-11-29 NOTE — Discharge Summary (Signed)
Physician Discharge Summary  Julia Hart XTG:[id number] DOB: 1927/03/14 DOA: 11/20/2020  PCP: [name], MD  Admit date: 11/20/2020 Discharge date: 11/29/2020  Time spent: 35 minutes  Recommendations for Outpatient Follow-up:  1. Repeat basic metabolic panel to follow closely. 2. Continue to follow patient volume status with adjustment of chronic heart failure regimen and diuretics as needed.   Discharge Diagnoses:  Principal Problem:   CHF (congestive heart failure) (HCC) Active Problems:   Elevated troponin   AKI (acute kidney injury) ([place])   Hyponatremia   Mild protein-calorie malnutrition (HCC)   Diabetes mellitus type 2 in nonobese (HCC)   Acute on chronic combined systolic and diastolic CHF (congestive heart failure) (HCC)   CKD (chronic kidney disease) stage 3, GFR 30-59 ml/min (HCC)   SOB (shortness of breath)   Discharge Condition: Stable and improved.  Discharged home with instruction to follow-up with PCP and primary cardiologist as an outpatient.  CODE STATUS: DNR  Diet recommendation: Heart healthy/low-sodium diet.  Filed Weights   11/27/20 0500 11/28/20 0500 11/29/20 0500  Weight: 65.6 kg 66.1 kg 66.1 kg    History of present illness:  87 year old female with a history of coronary artery disease/NSTEMI, hypertension, hyperlipidemia, ischemic cardiomyopathy with previous EF 35% presenting with 1 week history of shortness of breath, orthopnea,and worsening lower extremity edema.She was started on IV lasix with good clinical improvement. Cardiology was consulted to assist. For her CKD 3, tolerance for worsening creatinine was allowed for improved euvolemia.IV lasix dosing has been gradually titrated up with relative stability of serum creatinine.  Hospital Course:  1-acute on chronic systolic and diastolic heart failure -2D echo 11/26/2018 demonstrating ejection fraction of 55%, with severe mid to this right ventricle function and severe tricuspid  regurgitation.  Previous echocardiogram done in [place] ejection fraction of 35%. -Continue low-sodium diet (less than 2 g daily) -Daily weight and adequate hydration also encouraged. -Isosorbide, hydralazine has been discontinued in the setting of low blood pressure -Amlodipine also discontinued. -Patient has been discharged on Demadex 40 mg by mouth daily, resumption of Entresto and metoprolol 25 mg extended release form. -Outpatient follow-up with primary cardiology service (Dr. Sabra Heck).  2-acute kidney injury on chronic kidney disease -Baseline creatinine 1.3-1.6 -On presentation creatinine up to 1.86 -Improved back to baseline at time of discharge -Repeat basic metabolic panel follow-up visit -Advised to maintain adequate hydration.  3-history of PVCs -Patient denies chest pain -Prior history of NSTEMI and PCI of the ostial and proximal RCA in 08/27/2017. -Continue aspirin, beta-blocker, statins and apixaban. -Continue medications.  4-essential hypertension -Overall stable and well-controlled -Continue metoprolol on current dose of diuretics -Low-sodium diet has been encouraged.  5-hyperlipidemia -Continue statins.  6-GERD -Continue PPI.  7-chronic atrial fibrillation -Chronic controlled -Continue beta-blocker and apixaban.  Procedures:  See below for x-ray reports.  Consultations:  Cardiology service  Discharge Exam: Vitals:   11/29/20 0728 11/29/20 1417  BP: (!) 141/52 (!) 154/70  Pulse: 84 87  Resp: 16 15  Temp: 97.7 F (36.5 C) (!) 97.5 F (36.4 C)  SpO2: 99% 100%    General: Reporting significant improvement in swelling; no shortness of breath, no orthopnea, no chest pain.  Feeling ready to go home. Cardiovascular: S1 and S2, no rubs, no gallops, no JVD appreciated on exam. Respiratory: Improved air movement bilaterally; no frank crackles, no wheezing, no requiring oxygen supplementation. Abdomen: Soft, nontender, nondistended,  positive bowel sounds Extremities: No cyanosis or clubbing.  Discharge Instructions   Discharge Instructions    (HEART  FAILURE PATIENTS) Call MD:  Anytime you have any of the following symptoms: 1) 3 pound weight gain in 24 hours or 5 pounds in 1 week 2) shortness of breath, with or without a dry hacking cough 3) swelling in the hands, feet or stomach 4) if you have to sleep on extra pillows at night in order to breathe.   Complete by: As directed    Diet - low sodium heart healthy   Complete by: As directed    Discharge instructions   Complete by: As directed    Follow low-sodium diet (less than 2 g daily) Take medications as prescribed Check your weight on daily basis  Arrange follow-up with PCP in 10 days Follow-up in the next 10 days with your primary cardiologist (Dr. Sabra Heck).   Increase activity slowly   Complete by: As directed      Allergies as of 11/29/2020   No Known Allergies     Medication List    STOP taking these medications   amLODipine 5 MG tablet Commonly known as: NORVASC   carvedilol 12.5 MG tablet Commonly known as: COREG   hydrALAZINE 50 MG tablet Commonly known as: APRESOLINE   isosorbide dinitrate 5 MG tablet Commonly known as: ISORDIL     TAKE these medications   acetaminophen 325 MG tablet Commonly known as: TYLENOL Take 2 tablets (650 mg total) by mouth every 6 (six) hours as needed for mild pain, fever or headache (or Fever >/= 101).   apixaban 2.5 MG Tabs tablet Commonly known as: ELIQUIS Take 1 tablet (2.5 mg total) by mouth 2 (two) times daily.   aspirin EC 81 MG tablet Take 1 tablet (81 mg total) by mouth daily with breakfast.   atorvastatin 20 MG tablet Commonly known as: LIPITOR Take 20 mg by mouth at bedtime.   empagliflozin 10 MG Tabs tablet Commonly known as: JARDIANCE Take 10 mg by mouth daily.   Entresto 97-103 MG Generic drug: sacubitril-valsartan Take 0.5 tablets by mouth 2 (two) times daily.   feeding supplement  Liqd Take 237 mLs by mouth 2 (two) times daily.   metoprolol succinate 25 MG 24 hr tablet Commonly known as: TOPROL-XL Take 1 tablet (25 mg total) by mouth daily. Start taking on: Nov 30, 2020   nitroGLYCERIN 0.4 MG SL tablet Commonly known as: NITROSTAT Place 0.4 mg under the tongue every 5 (five) minutes as needed for chest pain.   pantoprazole 40 MG tablet Commonly known as: PROTONIX Take 40 mg by mouth daily.   polyethylene glycol 17 g packet Commonly known as: MIRALAX / GLYCOLAX Take 17 g by mouth daily as needed for mild constipation.   prednisoLONE acetate 1 % ophthalmic suspension Commonly known as: PRED FORTE Place 1 drop into both eyes every other day.   senna-docusate 8.6-50 MG tablet Commonly known as: Senokot-S Take 2 tablets by mouth at bedtime.   Torsemide 40 MG Tabs Take 40 mg by mouth daily. What changed:   medication strength  how much to take      No Known Allergies  Follow-up Information    [name], MD. Schedule an appointment as soon as possible for a visit in 2 week(s).   Specialty: Internal Medicine Contact information: [place] [address] [phone]        Orpah Greek, MD. Schedule an appointment as soon as possible for a visit in 10 day(s).   Specialty: Internal Medicine Contact information: [place], [place]  [address] [phone]               The results of significant diagnostics from this hospitalization (including imaging, microbiology, ancillary and laboratory) are listed below for reference.    Significant Diagnostic Studies: CT ABDOMEN PELVIS WO CONTRAST  Result Date: 11/24/2020 CLINICAL DATA:  Abdominal distension. Acute nonlocalized abdominal pain. EXAM: CT ABDOMEN AND PELVIS WITHOUT CONTRAST TECHNIQUE: Multidetector CT imaging of the abdomen and pelvis was performed following the standard protocol without IV contrast. COMPARISON:  None. FINDINGS: Lower chest:  There multiple prominent perihilar air cysts bilaterally, only some of which are related to dilated central bronchi. There is an irregular nodule peripherally in the left upper lobe measuring 1.6 x 0.7 cm on image 19/4. The lung bases are otherwise clear. No significant pleural or pericardial effusion. Patient is status post median sternotomy, CABG and probable aortic grafting. There is diffuse aortic and coronary artery atherosclerosis. The heart is mildly enlarged. Hepatobiliary: No focal hepatic abnormalities are identified on noncontrast imaging. No definite signs of cirrhosis. There is possible mild periportal edema. No evidence of gallstones, gallbladder wall thickening or biliary dilatation. Pancreas: Unremarkable. No pancreatic ductal dilatation or surrounding inflammatory changes. Spleen: Normal in size without focal abnormality. Adrenals/Urinary Tract: Both adrenal glands appear normal. There are renal vascular calcifications bilaterally without definite urinary tract calculi. Bilateral renal cysts, measuring up to 4.4 cm in the upper pole of the left kidney. No hydronephrosis. The bladder is nearly empty without apparent abnormality. Stomach/Bowel: Enteric contrast was administered and has passed into the distal small bowel and colon. The stomach appears unremarkable for its degree of distension. No evidence of bowel wall thickening, distention or surrounding inflammatory change. There are diverticular changes of the distal colon. The appendix is not clearly visualized. Vascular/Lymphatic: Nodal assessment limited by generalized soft tissue edema and lack of contrast. No enlarged abdominopelvic lymph nodes identified. Diffuse aortic and branch vessel atherosclerosis. Reproductive: Status post hysterectomy. Probable residual ovarian tissue bilaterally without suspicious adnexal findings. Other: There is a moderate amount of ascites throughout the peritoneal cavity. There is generalized edema throughout the  subcutaneous and deep fat. No extravasated enteric contrast, focal fluid collection or extraluminal air collection identified. Musculoskeletal: Relatively mild degenerative changes throughout the spine and both hips. IMPRESSION: 1. Anasarca with ascites and generalized soft tissue edema. No focal fluid collections or pleural effusions identified. If the etiology for the ascites is a known clinically, paracentesis should be considered. 2. Distal colonic diverticulosis without bowel obstruction or focal inflammation. 3. Cystic lung disease, in part secondary to cystic bronchiectasis. 4. Diffuse Aortic Atherosclerosis (ICD10-I70.0). Electronically Signed   By: Richardean Sale M.D.   On: 11/24/2020 21:43   DG Chest Portable 1 View  Result Date: 11/20/2020 CLINICAL DATA:  Dyspnea, COVID infection a few months prior EXAM: PORTABLE CHEST 1 VIEW COMPARISON:  09/14/2020 chest radiograph. FINDINGS: Stable configuration of median sternotomy wires with chronic lower most sternotomy wire discontinuity. Stable cardiomediastinal silhouette with moderate cardiomegaly. No pneumothorax. No pleural effusion. Cephalization of the pulmonary vasculature without overt pulmonary edema. No acute consolidative airspace disease. IMPRESSION: Moderate cardiomegaly without overt pulmonary edema. No active pulmonary disease. Electronically Signed   By: Ilona Sorrel M.D.   On: 11/20/2020 19:01   ECHOCARDIOGRAM COMPLETE  Result Date: 11/25/2020    ECHOCARDIOGRAM REPORT   Patient Name:   Julia Hart Date of Exam: 11/25/2020 Medical Rec #:  [id number]     Height:       66.0 in Accession #:  [id number]    Weight:       143.3 lb Date of Birth:  29-Dec-1926     BSA:          1.736 m Patient Age:    87 years      BP:           134/45 mmHg Patient Gender: F             HR:           83 bpm. Exam Location:  Inpatient Procedure: 2D Echo, Cardiac Doppler and Color Doppler Indications:    Dyspnea R06.00  History:        Patient has prior history of  Echocardiogram examinations, most                 recent 08/04/2020. CAD; Risk Factors:Hypertension and                 Dyslipidemia.  Sonographer:    Tiffany Dance Referring Phys: [id number] [place]  1. Left ventricular ejection fraction, by estimation, is 55%. The left ventricle has normal function. The left ventricle has no regional wall motion abnormalities. There is severe left ventricular hypertrophy. Left ventricular diastolic parameters are indeterminate.  2. Right ventricular systolic function is severely reduced. The right ventricular size is moderately enlarged. The estimated right ventricular systolic pressure is 0000000 mmHg secondary to annular dilation and lack of TV coaptation, TR velocity is low.  3. Left atrial size was severely dilated.  4. Right atrial size was severely dilated.  5. A small pericardial effusion is present.  6. The mitral valve is degenerative. Mild to moderate mitral valve regurgitation. No evidence of mitral stenosis.  7. Tricuspid valve regurgitation is severe.  8. The aortic valve is abnormal. There is severe calcifcation of the aortic valve. Aortic valve regurgitation is moderate. Mild aortic valve stenosis. Aortic valve mean gradient measures 12.8 mmHg.  9. The inferior vena cava is dilated in size with <50% respiratory variability, suggesting right atrial pressure of 15 mmHg. FINDINGS  Left Ventricle: Left ventricular ejection fraction, by estimation, is 55%. The left ventricle has normal function. The left ventricle has no regional wall motion abnormalities. The left ventricular internal cavity size was normal in size. There is severe left ventricular hypertrophy. Left ventricular diastolic parameters are indeterminate. Right Ventricle: The right ventricular size is moderately enlarged. Right vetricular wall thickness was not well visualized. Right ventricular systolic function is severely reduced. The tricuspid regurgitant velocity is 1.25 m/s, and with an  assumed right atrial pressure of 15 mmHg, the estimated right ventricular systolic pressure is 0000000 mmHg. Left Atrium: Left atrial size was severely dilated. Right Atrium: Right atrial size was severely dilated. Pericardium: A small pericardial effusion is present. Mitral Valve: The mitral valve is degenerative in appearance. Mild mitral annular calcification. Mild to moderate mitral valve regurgitation. No evidence of mitral valve stenosis. Tricuspid Valve: The tricuspid valve is grossly normal. Tricuspid valve regurgitation is severe. Aortic Valve: The aortic valve is abnormal. There is severe calcifcation of the aortic valve. Aortic valve regurgitation is moderate. Aortic regurgitation PHT measures 341 msec. Mild aortic stenosis is present. Aortic valve mean gradient measures 12.8 mmHg. Aortic valve peak gradient measures 24.0 mmHg. Aortic valve area, by VTI measures 1.15 cm. Pulmonic Valve: The pulmonic valve was grossly normal. Pulmonic valve regurgitation is mild to moderate. Aorta: The aortic root and ascending aorta are structurally normal, with no evidence of dilitation. Venous: The inferior vena  cava is dilated in size with less than 50% respiratory variability, suggesting right atrial pressure of 15 mmHg. IAS/Shunts: No atrial level shunt detected by color flow Doppler.  LEFT VENTRICLE PLAX 2D LVIDd:         3.98 cm LVIDs:         2.73 cm LV PW:         1.76 cm LV IVS:        1.58 cm LVOT diam:     2.00 cm LV SV:         68 LV SV Index:   39 LVOT Area:     3.14 cm  RIGHT VENTRICLE RV Basal diam:  3.54 cm RV Mid diam:    2.78 cm RV S prime:     5.61 cm/s TAPSE (M-mode): 0.7 cm LEFT ATRIUM              Index       RIGHT ATRIUM           Index LA diam:        4.80 cm  2.77 cm/m  RA Area:     25.10 cm LA Vol (A2C):   110.0 ml 63.38 ml/m RA Volume:   88.60 ml  51.05 ml/m LA Vol (A4C):   93.3 ml  53.76 ml/m LA Biplane Vol: 102.0 ml 58.77 ml/m  AORTIC VALVE AV Area (Vmax):    1.10 cm AV Area (Vmean):    1.10 cm AV Area (VTI):     1.15 cm AV Vmax:           244.80 cm/s AV Vmean:          172.600 cm/s AV VTI:            0.593 m AV Peak Grad:      24.0 mmHg AV Mean Grad:      12.8 mmHg LVOT Vmax:         85.35 cm/s LVOT Vmean:        60.200 cm/s LVOT VTI:          0.217 m LVOT/AV VTI ratio: 0.37 AI PHT:            341 msec  AORTA Ao Root diam: 2.80 cm Ao Asc diam:  2.80 cm MITRAL VALVE               TRICUSPID VALVE MV Area (PHT): 3.66 cm    TR Peak grad:   6.2 mmHg MV Decel Time: 207 msec    TR Vmax:        125.00 cm/s MV E velocity: 88.80 cm/s MV A velocity: 53.30 cm/s  SHUNTS MV E/A ratio:  1.67        Systemic VTI:  0.22 m                            Systemic Diam: 2.00 cm Cherlynn Kaiser MD Electronically signed by Cherlynn Kaiser MD Signature Date/Time: 11/25/2020/3:17:27 PM    Final     Microbiology: Recent Results (from the past 240 hour(s))  Resp Panel by RT-PCR (Flu A&B, Covid) Nasopharyngeal Swab     Status: None   Collection Time: 11/20/20  6:21 PM   Specimen: Nasopharyngeal Swab; Nasopharyngeal(NP) swabs in vial transport medium  Result Value Ref Range Status   SARS Coronavirus 2 by RT PCR NEGATIVE NEGATIVE Final    Comment: (NOTE) SARS-CoV-2 target nucleic acids are NOT DETECTED.  The SARS-CoV-2  RNA is generally detectable in upper respiratory specimens during the acute phase of infection. The lowest concentration of SARS-CoV-2 viral copies this assay can detect is 138 copies/mL. A negative result does not preclude SARS-Cov-2 infection and should not be used as the sole basis for treatment or other patient management decisions. A negative result may occur with  improper specimen collection/handling, submission of specimen other than nasopharyngeal swab, presence of viral mutation(s) within the areas targeted by this assay, and inadequate number of viral copies(<138 copies/mL). A negative result must be combined with clinical observations, patient history, and  epidemiological information. The expected result is Negative.  Fact Sheet for Patients:  EntrepreneurPulse.com.au  Fact Sheet for Healthcare Providers:  IncredibleEmployment.be  This test is no t yet approved or cleared by the [place] FDA and  has been authorized for detection and/or diagnosis of SARS-CoV-2 by FDA under an Emergency Use Authorization (EUA). This EUA will remain  in effect (meaning this test can be used) for the duration of the COVID-19 declaration under Section 564(b)(1) of the Act, 21 U.S.C.section 360bbb-3(b)(1), unless the authorization is terminated  or revoked sooner.       Influenza A by PCR NEGATIVE NEGATIVE Final   Influenza B by PCR NEGATIVE NEGATIVE Final    Comment: (NOTE) The Xpert Xpress SARS-CoV-2/FLU/RSV plus assay is intended as an aid in the diagnosis of influenza from Nasopharyngeal swab specimens and should not be used as a sole basis for treatment. Nasal washings and aspirates are unacceptable for Xpert Xpress SARS-CoV-2/FLU/RSV testing.  Fact Sheet for Patients: EntrepreneurPulse.com.au  Fact Sheet for Healthcare Providers: IncredibleEmployment.be  This test is not yet approved or cleared by the [place] FDA and has been authorized for detection and/or diagnosis of SARS-CoV-2 by FDA under an Emergency Use Authorization (EUA). This EUA will remain in effect (meaning this test can be used) for the duration of the COVID-19 declaration under Section 564(b)(1) of the Act, 21 U.S.C. section 360bbb-3(b)(1), unless the authorization is terminated or revoked.  Performed at [facility], [address], [place], [place] [address]      Labs: Basic Metabolic Panel: Recent Labs  Lab 11/23/20 0548 11/24/20 0602 11/25/20 6433 11/26/20 0411 11/27/20 0439 11/28/20 0636 11/29/20 0521  NA 136   < > 134* 132* 137 135 139  K 4.2   < > 4.1 4.2 4.4 4.0 4.3  CL 100    < > 98 96* 99 96* 97*  CO2 27   < > 26 31 29 30  32  GLUCOSE 85   < > 77 92 86 88 82  BUN 59*   < > 70* 70* 72* 62* 67*  CREATININE 1.82*   < > 1.83* 1.66* 1.70* 1.41* 1.52*  CALCIUM 8.9   < > 8.8* 8.4* 8.9 8.7* 9.0  MG 2.2  --  2.1 2.2  --   --  2.2   < > = values in this interval not displayed.   Liver Function Tests: Recent Labs  Lab 11/23/20 0548  AST 26  ALT 11  ALKPHOS 65  BILITOT 0.6  PROT 6.9  ALBUMIN 3.4*   CBC: Recent Labs  Lab 11/23/20 0548 11/25/20 0528  WBC 4.0 3.7*  HGB 8.9* 8.5*  HCT 27.3* 26.1*  MCV 88.3 86.4  PLT 114* 103*   BNP (last 3 results) Recent Labs    09/14/20 1307 11/20/20 1840 11/23/20 0548  BNP 4,227.0* >4,500.0* >4,500.0*   CBG: Recent Labs  Lab 11/28/20 1128 11/28/20 1626 11/28/20  1945 11/29/20 0733 11/29/20 1202  GLUCAP 102* 126* 132* 88 87    Signed:  Barton Dubois MD.  [facility] 11/29/2020, 3:10 PM

## 2020-11-29 NOTE — Progress Notes (Signed)
Progress Note  Patient Name: Julia Hart Date of Encounter: 11/29/2020  Primary Cardiologist: Dr. Sabra Heck [facility])  Subjective   Feels like swelling has improved significantly.  No shortness of breath or chest pain at rest.  Inpatient Medications    Scheduled Meds: . apixaban  2.5 mg Oral BID  . aspirin EC  81 mg Oral Q breakfast  . atorvastatin  20 mg Oral QHS  . feeding supplement  237 mL Oral BID  . furosemide  80 mg Intravenous BID  . insulin aspart  0-15 Units Subcutaneous TID WC  . insulin aspart  0-5 Units Subcutaneous QHS  . loratadine  10 mg Oral Daily  . metoprolol succinate  25 mg Oral Daily  . polyethylene glycol  17 g Oral Daily  . prednisoLONE acetate  1 drop Both Eyes QODAY  . sacubitril-valsartan  0.5 tablet Oral BID  . senna  2 tablet Oral Daily    PRN Meds: acetaminophen **OR** acetaminophen, camphor-menthol, ondansetron **OR** ondansetron (ZOFRAN) IV, oxyCODONE, polyethylene glycol   Vital Signs    Vitals:   11/28/20 1944 11/29/20 0500 11/29/20 0552 11/29/20 0728  BP: (!) 114/50  136/60 (!) 141/52  Pulse: 65  (!) 57 84  Resp: 19  19 16   Temp: 98.1 F (36.7 C)  98.3 F (36.8 C) 97.7 F (36.5 C)  TempSrc:    Oral  SpO2: 100%  100% 99%  Weight:  66.1 kg      Intake/Output Summary (Last 24 hours) at 11/29/2020 1011 Last data filed at 11/29/2020 0900 Gross per 24 hour  Intake 480 ml  Output --  Net 480 ml   Filed Weights   11/27/20 0500 11/28/20 0500 11/29/20 0500  Weight: 65.6 kg 66.1 kg 66.1 kg    Telemetry    Atrial fibrillation.  Personally reviewed.  ECG    No ECG reviewed.  Physical Exam   GEN:  Elderly woman.  No acute distress.   Neck:  No JVD. Cardiac:  Irregularly irregular with 2/6 systolic murmur, no gallop.  Respiratory: Nonlabored.  Clear to auscultation bilaterally. GI: Soft, nontender, bowel sounds present. MS:  Trace ankle edema; No deformity.  Labs    Chemistry Recent Labs  Lab 11/23/20 0548  11/24/20 0602 11/27/20 0439 11/28/20 0636 11/29/20 0521  NA 136   < > 137 135 139  K 4.2   < > 4.4 4.0 4.3  CL 100   < > 99 96* 97*  CO2 27   < > 29 30 32  GLUCOSE 85   < > 86 88 82  BUN 59*   < > 72* 62* 67*  CREATININE 1.82*   < > 1.70* 1.41* 1.52*  CALCIUM 8.9   < > 8.9 8.7* 9.0  PROT 6.9  --   --   --   --   ALBUMIN 3.4*  --   --   --   --   AST 26  --   --   --   --   ALT 11  --   --   --   --   ALKPHOS 65  --   --   --   --   BILITOT 0.6  --   --   --   --   GFRNONAA 26*   < > 28* 35* 32*  ANIONGAP 9   < > 9 9 10    < > = values in this interval not displayed.     Hematology Recent Labs  Lab 11/23/20 0548 11/25/20 0528  WBC 4.0 3.7*  RBC 3.09* 3.02*  HGB 8.9* 8.5*  HCT 27.3* 26.1*  MCV 88.3 86.4  MCH 28.8 28.1  MCHC 32.6 32.6  RDW 15.3 15.5  PLT 114* 103*    Cardiac Enzymes Recent Labs  Lab 11/20/20 1840 11/20/20 2013 11/21/20 0213  TROPONINIHS 90* 101* 93*    BNP Recent Labs  Lab 11/23/20 0548  BNP >4,500.0*     Radiology    No results found.  Cardiac Studies   Echocardiogram 11/25/2020: 1. Left ventricular ejection fraction, by estimation, is 55%. The left  ventricle has normal function. The left ventricle has no regional wall  motion abnormalities. There is severe left ventricular hypertrophy. Left  ventricular diastolic parameters are  indeterminate.  2. Right ventricular systolic function is severely reduced. The right  ventricular size is moderately enlarged. The estimated right ventricular  systolic pressure is 51.7 mmHg secondary to annular dilation and lack of  TV coaptation, TR velocity is low.  3. Left atrial size was severely dilated.  4. Right atrial size was severely dilated.  5. A small pericardial effusion is present.  6. The mitral valve is degenerative. Mild to moderate mitral valve  regurgitation. No evidence of mitral stenosis.  7. Tricuspid valve regurgitation is severe.  8. The aortic valve is abnormal.  There is severe calcifcation of the  aortic valve. Aortic valve regurgitation is moderate. Mild aortic valve  stenosis. Aortic valve mean gradient measures 12.8 mmHg.  9. The inferior vena cava is dilated in size with <50% respiratory  variability, suggesting right atrial pressure of 15 mmHg.   Patient Profile     87 y.o. female with a history of ischemic cardiomyopathy, possible cardiac amyloidosis, CAD status post CABG, atrial fibrillation, hypertension, hyperlipidemia, osteoporosis, GERD, CKD with chronic anemia, now presenting with acute on chronic diastolic heart failure and RV dysfunction.  Assessment & Plan    1.  Acute on chronic diastolic heart failure and severe RV dysfunction with severe tricuspid regurgitation.  She has improved symptomatically on Lasix 80 mg IV twice daily with reasonable urine output.  2.  Persistent atrial fibrillation.  CHA2DS2-VASc score is 6.  She is on Eliquis 2.5 mg twice daily for stroke prophylaxis.  3.  CAD status post CABG.  No active angina at this time and relatively flat low-level elevation in high-sensitivity troponin I.  She has been on low-dose aspirin per her outpatient cardiologist along with Eliquis.  She has a history of ischemic cardiomyopathy, however follow-up echocardiogram now shows LVEF approximately 55%.  4.  Essential hypertension, systolic blood pressure 001V to 140s.  5.  CKD stage IIIb, creatinine down to 1.52.  Continue aspirin and Eliquis, Lipitor, Toprol-XL, and Entresto at current doses.  Go ahead and switch from Lasix to Demadex 40 mg daily.  Would not resume hydralazine or nitrate at this point.  Anticipate discharge home and keep follow-up with primary cardiologist Dr. Sabra Heck in [place].  Signed, Rozann Lesches, MD  11/29/2020, 10:11 AM

## 2020-12-25 ENCOUNTER — Emergency Department (HOSPITAL_COMMUNITY)
Admission: EM | Admit: 2020-12-25 | Discharge: 2020-12-25 | Disposition: A | Discharge status: Home / Self Care | Payer: Medicare Other | Attending: Emergency Medicine | Admitting: Emergency Medicine

## 2020-12-25 ENCOUNTER — Other Ambulatory Visit: Payer: Self-pay

## 2020-12-25 ENCOUNTER — Emergency Department (HOSPITAL_COMMUNITY): Payer: Medicare Other

## 2020-12-25 ENCOUNTER — Encounter (HOSPITAL_COMMUNITY): Payer: Self-pay | Admitting: Emergency Medicine

## 2020-12-25 DIAGNOSIS — E1122 Type 2 diabetes mellitus with diabetic chronic kidney disease: Secondary | ICD-10-CM | POA: Diagnosis not present

## 2020-12-25 DIAGNOSIS — I5043 Acute on chronic combined systolic (congestive) and diastolic (congestive) heart failure: Secondary | ICD-10-CM | POA: Diagnosis not present

## 2020-12-25 DIAGNOSIS — Z87891 Personal history of nicotine dependence: Secondary | ICD-10-CM | POA: Insufficient documentation

## 2020-12-25 DIAGNOSIS — Z7982 Long term (current) use of aspirin: Secondary | ICD-10-CM | POA: Diagnosis not present

## 2020-12-25 DIAGNOSIS — Z8616 Personal history of COVID-19: Secondary | ICD-10-CM | POA: Insufficient documentation

## 2020-12-25 DIAGNOSIS — Z79899 Other long term (current) drug therapy: Secondary | ICD-10-CM | POA: Diagnosis not present

## 2020-12-25 DIAGNOSIS — R1032 Left lower quadrant pain: Secondary | ICD-10-CM | POA: Insufficient documentation

## 2020-12-25 DIAGNOSIS — Z7984 Long term (current) use of oral hypoglycemic drugs: Secondary | ICD-10-CM | POA: Insufficient documentation

## 2020-12-25 DIAGNOSIS — R601 Generalized edema: Secondary | ICD-10-CM | POA: Insufficient documentation

## 2020-12-25 DIAGNOSIS — N183 Chronic kidney disease, stage 3 unspecified: Secondary | ICD-10-CM | POA: Diagnosis not present

## 2020-12-25 DIAGNOSIS — I130 Hypertensive heart and chronic kidney disease with heart failure and stage 1 through stage 4 chronic kidney disease, or unspecified chronic kidney disease: Secondary | ICD-10-CM | POA: Diagnosis not present

## 2020-12-25 DIAGNOSIS — Z955 Presence of coronary angioplasty implant and graft: Secondary | ICD-10-CM | POA: Insufficient documentation

## 2020-12-25 DIAGNOSIS — I2510 Atherosclerotic heart disease of native coronary artery without angina pectoris: Secondary | ICD-10-CM | POA: Insufficient documentation

## 2020-12-25 DIAGNOSIS — R1084 Generalized abdominal pain: Secondary | ICD-10-CM | POA: Insufficient documentation

## 2020-12-25 LAB — URINALYSIS, ROUTINE W REFLEX MICROSCOPIC
Bacteria, UA: NONE SEEN
Bilirubin Urine: NEGATIVE
Glucose, UA: 150 mg/dL — AB
Ketones, ur: NEGATIVE mg/dL
Nitrite: NEGATIVE
Protein, ur: 100 mg/dL — AB
Specific Gravity, Urine: 1.012 (ref 1.005–1.030)
pH: 5 (ref 5.0–8.0)

## 2020-12-25 LAB — CBC WITH DIFFERENTIAL/PLATELET
Abs Immature Granulocytes: 0.01 10*3/uL (ref 0.00–0.07)
Basophils Absolute: 0 10*3/uL (ref 0.0–0.1)
Basophils Relative: 1 %
Eosinophils Absolute: 0.2 10*3/uL (ref 0.0–0.5)
Eosinophils Relative: 5 %
HCT: 33.9 % — ABNORMAL LOW (ref 36.0–46.0)
Hemoglobin: 10.7 g/dL — ABNORMAL LOW (ref 12.0–15.0)
Immature Granulocytes: 0 %
Lymphocytes Relative: 34 %
Lymphs Abs: 1.1 10*3/uL (ref 0.7–4.0)
MCH: 27.2 pg (ref 26.0–34.0)
MCHC: 31.6 g/dL (ref 30.0–36.0)
MCV: 86 fL (ref 80.0–100.0)
Monocytes Absolute: 0.5 10*3/uL (ref 0.1–1.0)
Monocytes Relative: 15 %
Neutro Abs: 1.5 10*3/uL — ABNORMAL LOW (ref 1.7–7.7)
Neutrophils Relative %: 45 %
Platelets: 125 10*3/uL — ABNORMAL LOW (ref 150–400)
RBC: 3.94 MIL/uL (ref 3.87–5.11)
RDW: 15 % (ref 11.5–15.5)
WBC: 3.4 10*3/uL — ABNORMAL LOW (ref 4.0–10.5)
nRBC: 0 % (ref 0.0–0.2)

## 2020-12-25 LAB — COMPREHENSIVE METABOLIC PANEL
ALT: 17 U/L (ref 0–44)
AST: 36 U/L (ref 15–41)
Albumin: 3.8 g/dL (ref 3.5–5.0)
Alkaline Phosphatase: 91 U/L (ref 38–126)
Anion gap: 8 (ref 5–15)
BUN: 40 mg/dL — ABNORMAL HIGH (ref 8–23)
CO2: 27 mmol/L (ref 22–32)
Calcium: 8.8 mg/dL — ABNORMAL LOW (ref 8.9–10.3)
Chloride: 96 mmol/L — ABNORMAL LOW (ref 98–111)
Creatinine, Ser: 1.36 mg/dL — ABNORMAL HIGH (ref 0.44–1.00)
GFR, Estimated: 37 mL/min — ABNORMAL LOW (ref >60)
Glucose, Bld: 98 mg/dL (ref 70–99)
Potassium: 3.3 mmol/L — ABNORMAL LOW (ref 3.5–5.1)
Sodium: 131 mmol/L — ABNORMAL LOW (ref 135–145)
Total Bilirubin: 1.3 mg/dL — ABNORMAL HIGH (ref 0.3–1.2)
Total Protein: 7.9 g/dL (ref 6.5–8.1)

## 2020-12-25 LAB — BRAIN NATRIURETIC PEPTIDE: B Natriuretic Peptide: >4500 pg/mL — ABNORMAL HIGH (ref 0.0–100.0)

## 2020-12-25 LAB — LIPASE, BLOOD: Lipase: 44 U/L (ref 11–51)

## 2020-12-25 MED ORDER — HYDROCODONE-ACETAMINOPHEN 5-325 MG PO TABS
1.0000 | ORAL_TABLET | Freq: Once | ORAL | Status: AC
Start: 2020-12-25 — End: 2020-12-25
  Administered 2020-12-25: 1 via ORAL
  Filled 2020-12-25: qty 1

## 2020-12-25 MED ORDER — HYDROCODONE-ACETAMINOPHEN 5-325 MG PO TABS: 1.0000 | ORAL_TABLET | Freq: Three times a day (TID) | ORAL | 0 refills | Status: DC | PRN

## 2020-12-25 NOTE — ED Provider Notes (Signed)
Emergency Medicine Provider Triage Evaluation Note  Julia Hart , a 87 y.o. female  was evaluated in triage.  Pt complains of left-sided abdominal pain.  Patient is a difficult historian and no family at bedside.  Reports she has pain "all the time", but then states that this pain started on Thursday.  Denies any nausea or vomiting.  Last bowel movement was yesterday and normal.  Denies any urinary symptoms.  No chest pain or shortness of breath.  No fever or chills.  She states that she was hospitalized and had a CT scan "a while back", per chart review, appears to be in early May of this year, which did not find anything.  CT scan shows some anasarca.  She states that her pain is uncontrolled.  Review of Systems  Positive: As above Negative: As above  Physical Exam  BP 120/82   Pulse 68   Temp 97.8 F (36.6 C) (Oral)   Resp 18   Ht 5\' 6"  (1.676 m)   Wt 54 kg   SpO2 100%   BMI 19.21 kg/m  Gen:   Awake, no distress   Resp:  Normal effort  MSK:   Moves extremities without difficulty  Other:  Mildly tender left-sided abdomen, with mild distention.  Medical Decision Making  Medically screening exam initiated at 6:19 PM.  Appropriate orders placed.  Julia Hart was informed that the remainder of the evaluation will be completed by another provider, this initial triage assessment does not replace that evaluation, and the importance of remaining in the ED until their evaluation is complete.     Garald Balding, PA-C 12/25/20 1820    Carmin Muskrat, MD 12/25/20 2119

## 2020-12-25 NOTE — ED Provider Notes (Signed)
[facility] EMERGENCY DEPARTMENT Provider Note   CSN: [id number] Arrival date & time: 12/25/20  1547     History Chief Complaint  Patient presents with  . Abdominal Pain    Julia Hart is a 87 y.o. female.  HPI Presents with left-sided abdominal pain, anorexia. Patient has multiple medical issues including CHF, CKD, hypertension, hyperlipidemia. She notes that she has had pain intermittently for some time, unspecified.  Today she had an episode of pain just prior to ED arrival, but currently has no discomfort. No dyspnea, no fever.  He receives primary care in [place].  She notes that she has not seen her physician recently, and is having difficulty obtaining care with him.    Chart review is clear the patient has multiple medical issues, as above.  She was discharged from this facility almost 1 month ago.  Discharge diagnosis at that point CHF, AKI.  Echocardiogram performed during that visit results below: -2D echo 11/26/2018 demonstrating ejection fraction of 55%, with severe mid to this right ventricle function and severe tricuspid regurgitation.  Previous echocardiogram done in [place] ejection fraction of 35%.       Past Medical History:  Diagnosis Date  . CAD (coronary artery disease)   . HTN (hypertension)   . Hyperlipidemia     Patient Active Problem List   Diagnosis Date Noted  . SOB (shortness of breath)   . Acute on chronic combined systolic and diastolic CHF (congestive heart failure) ([place]) 11/22/2020  . CKD (chronic kidney disease) stage 3, GFR 30-59 ml/min (HCC) 11/22/2020  . Elevated troponin 11/21/2020  . AKI (acute kidney injury) ([place]) 11/21/2020  . Hyponatremia 11/21/2020  . Mild protein-calorie malnutrition ([place]) 11/21/2020  . Diabetes mellitus type 2 in nonobese ([place]) 11/21/2020  . CHF (congestive heart failure) ([place]) 11/20/2020  . Acute exacerbation of CHF (congestive heart failure) ([place]) 09/15/2020  . Acute on chronic  systolic CHF (congestive heart failure) ([place]) 08/04/2020  . Decompensated heart failure ([place]) 08/03/2020  . Coronary artery disease 08/03/2020  . HTN (hypertension) 08/03/2020  . COVID-19 virus infection 08/03/2020  . Atrial fibrillation, chronic ([place]) 08/03/2020  . GERD (gastroesophageal reflux disease) 07/20/2020  . Melena 06/29/2020  . Abdominal pain 06/29/2020  . Loss of weight   . Anemia 12/19/2016  . Change in bowel habits 12/19/2016    Past Surgical History:  Procedure Laterality Date  . ABDOMINAL HYSTERECTOMY    . BIOPSY  01/13/2017   Procedure: BIOPSY;  Surgeon: Danie Binder, MD;  Location: [facility];  Service: Endoscopy;;  duodenal gastric   . COLONOSCOPY N/A 01/13/2017   Procedure: COLONOSCOPY;  Surgeon: Danie Binder, MD;  Location: [facility];  Service: Endoscopy;  Laterality: N/A;  3:00pm-moved to 1:45pm per [place]  . CORONARY ANGIOPLASTY WITH STENT PLACEMENT  2018  . CORONARY ARTERY BYPASS GRAFT  2008  . ESOPHAGOGASTRODUODENOSCOPY N/A 01/13/2017   Procedure: ESOPHAGOGASTRODUODENOSCOPY (EGD);  Surgeon: Danie Binder, MD;  Location: [facility];  Service: Endoscopy;  Laterality: N/A;  . ESOPHAGOGASTRODUODENOSCOPY (EGD) WITH PROPOFOL N/A 06/29/2020   Procedure: ESOPHAGOGASTRODUODENOSCOPY (EGD) WITH PROPOFOL;  Surgeon: Daneil Dolin, MD;  Location: [facility];  Service: Endoscopy;  Laterality: N/A;  1:45pm  . GIVENS CAPSULE STUDY N/A 09/05/2020   Procedure: GIVENS CAPSULE STUDY;  Surgeon: Eloise Harman, DO;  Location: [facility];  Service: Endoscopy;  Laterality: N/A;  7:30am     OB History   No obstetric history on file.  Family History  Problem Relation Age of Onset  . Colon cancer Neg Hx   . Colon polyps Neg Hx     Social History   Tobacco Use  . Smoking status: Former Research scientist (life sciences)  . Smokeless tobacco: Never Used  Vaping Use  . Vaping Use: Never used  Substance Use Topics  . Alcohol use: No  . Drug use: No    Home  Medications Prior to Admission medications   Medication Sig Start Date End Date Taking? Authorizing Provider  acetaminophen (TYLENOL) 325 MG tablet Take 2 tablets (650 mg total) by mouth every 6 (six) hours as needed for mild pain, fever or headache (or Fever >/= 101). 09/18/20   Emokpae, Courage, MD  apixaban (ELIQUIS) 2.5 MG TABS tablet Take 1 tablet (2.5 mg total) by mouth 2 (two) times daily. 09/18/20   Roxan Hockey, MD  aspirin EC 81 MG tablet Take 1 tablet (81 mg total) by mouth daily with breakfast. 09/18/20 09/18/21  Roxan Hockey, MD  atorvastatin (LIPITOR) 20 MG tablet Take 20 mg by mouth at bedtime.     [provider]  empagliflozin (JARDIANCE) 10 MG TABS tablet Take 10 mg by mouth daily.    [provider]  ENTRESTO 97-103 MG Take 0.5 tablets by mouth 2 (two) times daily. 09/19/20   Roxan Hockey, MD  feeding supplement (ENSURE ENLIVE / ENSURE PLUS) LIQD Take 237 mLs by mouth 2 (two) times daily. 09/18/20   Roxan Hockey, MD  metoprolol succinate (TOPROL-XL) 25 MG 24 hr tablet Take 1 tablet (25 mg total) by mouth daily. 11/30/20   Barton Dubois, MD  metoprolol tartrate (LOPRESSOR) 25 MG tablet Take 25 mg by mouth every morning. 12/20/20   [provider]  nitroGLYCERIN (NITROSTAT) 0.4 MG SL tablet Place 0.4 mg under the tongue every 5 (five) minutes as needed for chest pain.    [provider]  pantoprazole (PROTONIX) 40 MG tablet Take 40 mg by mouth daily. 05/17/20   [provider]  polyethylene glycol (MIRALAX / GLYCOLAX) 17 g packet Take 17 g by mouth daily as needed for mild constipation. 09/18/20   Roxan Hockey, MD  prednisoLONE acetate (PRED FORTE) 1 % ophthalmic suspension Place 1 drop into both eyes every other day. 03/15/20   [provider]  senna-docusate (SENOKOT-S) 8.6-50 MG tablet Take 2 tablets by mouth at bedtime. 09/18/20 09/18/21  Roxan Hockey, MD  torsemide 40 MG TABS Take 40 mg by mouth daily. 11/29/20    Barton Dubois, MD  triamcinolone cream (KENALOG) 0.1 % Apply topically 2 (two) times daily. 12/14/20   [provider]    Allergies    Patient has no known allergies.  Review of Systems   Review of Systems  Constitutional:       Per HPI, otherwise negative  HENT:       Per HPI, otherwise negative  Respiratory:       Per HPI, otherwise negative  Cardiovascular:       Per HPI, otherwise negative  Gastrointestinal: Positive for abdominal pain and nausea. Negative for vomiting.  Endocrine:       Negative aside from HPI  Genitourinary:       Neg aside from HPI   Musculoskeletal:       Per HPI, otherwise negative  Skin: Negative.   Neurological: Negative for syncope.    Physical Exam Updated Vital Signs BP (!) 173/65   Pulse 82   Temp 97.7 F (36.5 C) (Oral)   Resp  13   Ht 5\' 6"  (1.676 m)   Wt 54 kg   SpO2 98%   BMI 19.21 kg/m   Physical Exam Vitals and nursing note reviewed.  Constitutional:      General: She is not in acute distress.    Appearance: She is well-developed.  HENT:     Head: Normocephalic and atraumatic.  Eyes:     Conjunctiva/sclera: Conjunctivae normal.  Cardiovascular:     Rate and Rhythm: Normal rate and regular rhythm.  Pulmonary:     Effort: Pulmonary effort is normal. No respiratory distress.     Breath sounds: Normal breath sounds. No stridor.  Abdominal:     General: There is no distension.     Tenderness: There is abdominal tenderness in the left lower quadrant.  Skin:    General: Skin is warm and dry.  Neurological:     Mental Status: She is alert and oriented to person, place, and time.     Cranial Nerves: No cranial nerve deficit.     ED Results / Procedures / Treatments   Labs (all labs ordered are listed, but only abnormal results are displayed) Labs Reviewed  CBC WITH DIFFERENTIAL/PLATELET - Abnormal; Notable for the following components:      Result Value   WBC 3.4 (*)    Hemoglobin 10.7 (*)    HCT 33.9 (*)     Platelets 125 (*)    Neutro Abs 1.5 (*)    All other components within normal limits  COMPREHENSIVE METABOLIC PANEL - Abnormal; Notable for the following components:   Sodium 131 (*)    Potassium 3.3 (*)    Chloride 96 (*)    BUN 40 (*)    Creatinine, Ser 1.36 (*)    Calcium 8.8 (*)    Total Bilirubin 1.3 (*)    GFR, Estimated 37 (*)    All other components within normal limits  URINALYSIS, ROUTINE W REFLEX MICROSCOPIC - Abnormal; Notable for the following components:   APPearance HAZY (*)    Glucose, UA 150 (*)    Hgb urine dipstick MODERATE (*)    Protein, ur 100 (*)    Leukocytes,Ua TRACE (*)    All other components within normal limits  BRAIN NATRIURETIC PEPTIDE - Abnormal; Notable for the following components:   B Natriuretic Peptide >4,500.0 (*)    All other components within normal limits  LIPASE, BLOOD     Radiology CT ABDOMEN PELVIS WO CONTRAST  Result Date: 12/25/2020 CLINICAL DATA:  87 year old with left-sided abdominal pain. EXAM: CT ABDOMEN AND PELVIS WITHOUT CONTRAST TECHNIQUE: Multidetector CT imaging of the abdomen and pelvis was performed following the standard protocol without IV contrast. COMPARISON:  CT 1 month ago 11/24/2020 FINDINGS: Lower chest: Again seen cardiomegaly and aortic atherosclerosis. Cystic change in the left lung base is stable from prior exam. Cystic change in the right lung on prior not included in the field of view on the current exam. No acute airspace disease. Hepatobiliary: No evidence of focal liver lesion on this noncontrast exam. Questionable but not definite slight micro nodular hepatic contours. Gallbladder tentatively visualized without calcified gallstone, difficult to fully evaluate due to adjacent ascites. Pancreas: Unremarkable noncontrast appearance. Spleen: No splenomegaly. Adrenals/Urinary Tract: No adrenal nodule. No hydronephrosis. Calcifications at the renal hila appear to represent vascular calcifications rather than renal  calculi. These are not significantly changed from prior. There are bilateral renal cysts. Tiny hyperdense cyst in the posterior left kidney. Unremarkable urinary bladder. Stomach/Bowel: Decompressed stomach,  unremarkable for degree of distension. Small amount of ingested material in the distal stomach. There is scattered fecalization of small bowel contents without obstruction or inflammation. Appendix is not definitively visualized. Moderate volume of colonic stool. Mild colonic redundancy. Sigmoid colonic diverticulosis without focal diverticulitis. No obvious colonic wall thickening or inflammation. Vascular/Lymphatic: Advanced aortic and branch atherosclerosis. No aortic aneurysm. Limited assessment for adenopathy on this noncontrast exam given presence of ascites in generalized soft tissue edema. No bulky abdominopelvic adenopathy seen. Reproductive: Post hysterectomy.  No adnexal mass. Other: Moderate volume abdominopelvic ascites with equivocal slight increase from prior exam. There is generalized edema of the subcutaneous and intra-abdominal fat. No free air. No abdominal wall hernia. Musculoskeletal: Multiple Schmorl's nodes in the lumbar spine. Degenerative change of both sacroiliac joints. There are no acute or suspicious osseous abnormalities. IMPRESSION: 1. Moderate volume abdominopelvic ascites with equivocal slight increase from CT last month. Generalized edema of the subcutaneous and intra-abdominal fat. Findings consistent with third-spacing. 2. Colonic diverticulosis without diverticulitis. 3. Fecalization of small bowel contents without obstruction or inflammation, suggesting slow transit. Aortic Atherosclerosis (ICD10-I70.0). Electronically Signed   By: Keith Rake M.D.   On: 12/25/2020 19:12    Procedures Procedures   Medications Ordered in ED Medications - No data to display  ED Course  I have reviewed the triage vital signs and the nursing notes.  Pertinent labs & imaging  results that were available during my care of the patient were reviewed by me and considered in my medical decision making (see chart for details).  With consideration of broad differential including CHF exacerbation, acute abdominal processes, infection, patient was placed on continuous cardiac monitoring, pulse oximetry. Pulse oximetry 99% room air normal Cardiac 80s sinus normal      10:47 PM Patient now accompanied by her niece, who identifies as primary caregiver. Patient remains in no distress, stating that she has no current substantial pain, but reiterates that it is pain in the left side of her abdomen that brought her here today.  She denies dyspnea, chest pain. With me so we lengthy conversation about the patient's CHF, age, fluid retention, and goals of care.  They note understanding of the patient's advanced age, and her CHF diagnosis.  They both note that the primary concern for the patient and the niece is appropriate pain control, as they have not been provided anything beyond acetaminophen, and this is not controlling the patient's symptoms. Needs also notes that patient recently halved her Lasix dosing, and this may be contributing to the patient's persistently elevated BNP, and ascites.  Patient remains hemodynamically unremarkable, no hypotension.  I offered admission for ongoing therapy, but the patient and her niece both prefer outpatient follow-up.  We discussed risks and benefits of additional analgesics, including narcotics, specifically discussing driving prohibitions, likelihood of instability, possible increased falls.  Patient and niece amenable to initiation of short course of narcotics, primary care follow-up to see if these are working. Here, though the patient's labs are consistent with prior, notable for elevated BNP, and CT suggest fluid retention, she has no new oxygen requirement, no evidence for distress, complains of no dyspnea, no chest pain.  Patient will  return to her prior Lasix dosing, follow-up as outpatient. MDM Rules/Calculators/A&P MDM Number of Diagnoses or Management Options Acute on chronic combined systolic and diastolic congestive heart failure ([place]): established, worsening Anasarca: established, worsening Generalized abdominal pain: established, worsening   Amount and/or Complexity of Data Reviewed Clinical lab tests: ordered and reviewed Tests in  the radiology section of CPT: ordered and reviewed Tests in the medicine section of CPT: reviewed and ordered Decide to obtain previous medical records or to obtain history from someone other than the patient: yes Obtain history from someone other than the patient: yes Review and summarize past medical records: yes Independent visualization of images, tracings, or specimens: yes  Risk of Complications, Morbidity, and/or Mortality Presenting problems: high Diagnostic procedures: high Management options: high  Critical Care Total time providing critical care: < 30 minutes  Patient Progress Patient progress: stable   Final Clinical Impression(s) / ED Diagnoses Final diagnoses:  Generalized abdominal pain  Anasarca  Acute on chronic combined systolic and diastolic congestive heart failure [facility])    Rx / DC Orders ED Discharge Orders    None       Carmin Muskrat, MD 12/25/20 2254

## 2020-12-25 NOTE — Discharge Instructions (Signed)
As discussed, please return to your prior fluid pill regimen.  Follow-up with your physician to ensure appropriate ongoing care.  Do not hesitate to return here for concerning changes in your condition.

## 2020-12-25 NOTE — ED Triage Notes (Signed)
Pt c/o abdominal pain that began this morning. Pt states her last BM was yesterday.

## 2021-01-25 ENCOUNTER — Encounter (HOSPITAL_COMMUNITY): Payer: Self-pay | Admitting: *Deleted

## 2021-01-25 ENCOUNTER — Other Ambulatory Visit: Payer: Self-pay

## 2021-01-25 ENCOUNTER — Emergency Department (HOSPITAL_COMMUNITY): Payer: Medicare Other

## 2021-01-25 ENCOUNTER — Inpatient Hospital Stay (HOSPITAL_COMMUNITY)
Admission: EM | Admit: 2021-01-25 | Discharge: 2021-01-30 | DRG: 291 | Disposition: A | Discharge status: Skilled Nursing Facility | Payer: Medicare Other | Attending: Family Medicine | Admitting: Family Medicine

## 2021-01-25 DIAGNOSIS — I2510 Atherosclerotic heart disease of native coronary artery without angina pectoris: Secondary | ICD-10-CM | POA: Diagnosis present

## 2021-01-25 DIAGNOSIS — Z90710 Acquired absence of both cervix and uterus: Secondary | ICD-10-CM | POA: Diagnosis not present

## 2021-01-25 DIAGNOSIS — Z8616 Personal history of COVID-19: Secondary | ICD-10-CM | POA: Diagnosis not present

## 2021-01-25 DIAGNOSIS — Z79899 Other long term (current) drug therapy: Secondary | ICD-10-CM | POA: Diagnosis not present

## 2021-01-25 DIAGNOSIS — E785 Hyperlipidemia, unspecified: Secondary | ICD-10-CM | POA: Diagnosis present

## 2021-01-25 DIAGNOSIS — I4891 Unspecified atrial fibrillation: Secondary | ICD-10-CM

## 2021-01-25 DIAGNOSIS — K219 Gastro-esophageal reflux disease without esophagitis: Secondary | ICD-10-CM | POA: Diagnosis present

## 2021-01-25 DIAGNOSIS — I255 Ischemic cardiomyopathy: Secondary | ICD-10-CM | POA: Diagnosis present

## 2021-01-25 DIAGNOSIS — I130 Hypertensive heart and chronic kidney disease with heart failure and stage 1 through stage 4 chronic kidney disease, or unspecified chronic kidney disease: Principal | ICD-10-CM | POA: Diagnosis present

## 2021-01-25 DIAGNOSIS — Z951 Presence of aortocoronary bypass graft: Secondary | ICD-10-CM | POA: Diagnosis not present

## 2021-01-25 DIAGNOSIS — Z87891 Personal history of nicotine dependence: Secondary | ICD-10-CM

## 2021-01-25 DIAGNOSIS — R109 Unspecified abdominal pain: Secondary | ICD-10-CM | POA: Diagnosis present

## 2021-01-25 DIAGNOSIS — I252 Old myocardial infarction: Secondary | ICD-10-CM

## 2021-01-25 DIAGNOSIS — E1122 Type 2 diabetes mellitus with diabetic chronic kidney disease: Secondary | ICD-10-CM | POA: Diagnosis present

## 2021-01-25 DIAGNOSIS — E876 Hypokalemia: Secondary | ICD-10-CM | POA: Diagnosis present

## 2021-01-25 DIAGNOSIS — Z66 Do not resuscitate: Secondary | ICD-10-CM | POA: Diagnosis present

## 2021-01-25 DIAGNOSIS — I5043 Acute on chronic combined systolic (congestive) and diastolic (congestive) heart failure: Secondary | ICD-10-CM | POA: Diagnosis not present

## 2021-01-25 DIAGNOSIS — R54 Age-related physical debility: Secondary | ICD-10-CM | POA: Diagnosis present

## 2021-01-25 DIAGNOSIS — I5023 Acute on chronic systolic (congestive) heart failure: Secondary | ICD-10-CM

## 2021-01-25 DIAGNOSIS — I482 Chronic atrial fibrillation, unspecified: Secondary | ICD-10-CM | POA: Diagnosis not present

## 2021-01-25 DIAGNOSIS — I5033 Acute on chronic diastolic (congestive) heart failure: Secondary | ICD-10-CM | POA: Diagnosis present

## 2021-01-25 DIAGNOSIS — Z7901 Long term (current) use of anticoagulants: Secondary | ICD-10-CM

## 2021-01-25 DIAGNOSIS — I4819 Other persistent atrial fibrillation: Secondary | ICD-10-CM | POA: Diagnosis present

## 2021-01-25 DIAGNOSIS — Z7189 Other specified counseling: Secondary | ICD-10-CM | POA: Diagnosis not present

## 2021-01-25 DIAGNOSIS — R0600 Dyspnea, unspecified: Secondary | ICD-10-CM

## 2021-01-25 DIAGNOSIS — N179 Acute kidney failure, unspecified: Secondary | ICD-10-CM | POA: Diagnosis present

## 2021-01-25 DIAGNOSIS — Z7984 Long term (current) use of oral hypoglycemic drugs: Secondary | ICD-10-CM

## 2021-01-25 DIAGNOSIS — Z20822 Contact with and (suspected) exposure to covid-19: Secondary | ICD-10-CM | POA: Diagnosis present

## 2021-01-25 DIAGNOSIS — I5082 Biventricular heart failure: Secondary | ICD-10-CM | POA: Diagnosis present

## 2021-01-25 DIAGNOSIS — E441 Mild protein-calorie malnutrition: Secondary | ICD-10-CM

## 2021-01-25 DIAGNOSIS — I509 Heart failure, unspecified: Secondary | ICD-10-CM

## 2021-01-25 DIAGNOSIS — E119 Type 2 diabetes mellitus without complications: Secondary | ICD-10-CM | POA: Diagnosis not present

## 2021-01-25 DIAGNOSIS — N1832 Chronic kidney disease, stage 3b: Secondary | ICD-10-CM | POA: Diagnosis present

## 2021-01-25 DIAGNOSIS — Z515 Encounter for palliative care: Secondary | ICD-10-CM | POA: Diagnosis not present

## 2021-01-25 DIAGNOSIS — Z7982 Long term (current) use of aspirin: Secondary | ICD-10-CM

## 2021-01-25 DIAGNOSIS — N183 Chronic kidney disease, stage 3 unspecified: Secondary | ICD-10-CM | POA: Diagnosis present

## 2021-01-25 DIAGNOSIS — R519 Headache, unspecified: Secondary | ICD-10-CM | POA: Diagnosis not present

## 2021-01-25 DIAGNOSIS — R140 Abdominal distension (gaseous): Secondary | ICD-10-CM

## 2021-01-25 LAB — COMPREHENSIVE METABOLIC PANEL
ALT: 14 U/L (ref 0–44)
AST: 33 U/L (ref 15–41)
Albumin: 3.3 g/dL — ABNORMAL LOW (ref 3.5–5.0)
Alkaline Phosphatase: 92 U/L (ref 38–126)
Anion gap: 9 (ref 5–15)
BUN: 49 mg/dL — ABNORMAL HIGH (ref 8–23)
CO2: 25 mmol/L (ref 22–32)
Calcium: 8.5 mg/dL — ABNORMAL LOW (ref 8.9–10.3)
Chloride: 103 mmol/L (ref 98–111)
Creatinine, Ser: 2.01 mg/dL — ABNORMAL HIGH (ref 0.44–1.00)
GFR, Estimated: 23 mL/min — ABNORMAL LOW (ref >60)
Glucose, Bld: 122 mg/dL — ABNORMAL HIGH (ref 70–99)
Potassium: 3.2 mmol/L — ABNORMAL LOW (ref 3.5–5.1)
Sodium: 137 mmol/L (ref 135–145)
Total Bilirubin: 1 mg/dL (ref 0.3–1.2)
Total Protein: 7.1 g/dL (ref 6.5–8.1)

## 2021-01-25 LAB — CBC
HCT: 32.4 % — ABNORMAL LOW (ref 36.0–46.0)
Hemoglobin: 10.2 g/dL — ABNORMAL LOW (ref 12.0–15.0)
MCH: 26.2 pg (ref 26.0–34.0)
MCHC: 31.5 g/dL (ref 30.0–36.0)
MCV: 83.3 fL (ref 80.0–100.0)
Platelets: 190 10*3/uL (ref 150–400)
RBC: 3.89 MIL/uL (ref 3.87–5.11)
RDW: 16 % — ABNORMAL HIGH (ref 11.5–15.5)
WBC: 3.5 10*3/uL — ABNORMAL LOW (ref 4.0–10.5)
nRBC: 0 % (ref 0.0–0.2)

## 2021-01-25 LAB — TROPONIN I (HIGH SENSITIVITY)
Troponin I (High Sensitivity): 74 ng/L — ABNORMAL HIGH (ref <18)
Troponin I (High Sensitivity): 78 ng/L — ABNORMAL HIGH (ref <18)

## 2021-01-25 LAB — MAGNESIUM: Magnesium: 1.8 mg/dL (ref 1.7–2.4)

## 2021-01-25 LAB — GLUCOSE, CAPILLARY: Glucose-Capillary: 90 mg/dL (ref 70–99)

## 2021-01-25 LAB — RESP PANEL BY RT-PCR (FLU A&B, COVID) ARPGX2
Influenza A by PCR: NEGATIVE
Influenza B by PCR: NEGATIVE
SARS Coronavirus 2 by RT PCR: NEGATIVE

## 2021-01-25 LAB — LIPASE, BLOOD: Lipase: 50 U/L (ref 11–51)

## 2021-01-25 LAB — BRAIN NATRIURETIC PEPTIDE: B Natriuretic Peptide: >4500 pg/mL — ABNORMAL HIGH (ref 0.0–100.0)

## 2021-01-25 MED ORDER — ACETAMINOPHEN 325 MG PO TABS
650.0000 mg | ORAL_TABLET | ORAL | Status: DC | PRN
  Administered 2021-01-25 – 2021-01-27 (×2): 650 mg via ORAL
  Filled 2021-01-25 (×2): qty 2

## 2021-01-25 MED ORDER — POTASSIUM CHLORIDE CRYS ER 20 MEQ PO TBCR
40.0000 meq | EXTENDED_RELEASE_TABLET | Freq: Once | ORAL | Status: AC
  Administered 2021-01-25: 40 meq via ORAL
  Filled 2021-01-25: qty 2

## 2021-01-25 MED ORDER — PREDNISOLONE ACETATE 1 % OP SUSP: 1.0000 [drp] | OPHTHALMIC | Status: DC

## 2021-01-25 MED ORDER — METOPROLOL TARTRATE 25 MG PO TABS
25.0000 mg | ORAL_TABLET | Freq: Every morning | ORAL | Status: DC
  Administered 2021-01-26 – 2021-01-30 (×5): 25 mg via ORAL
  Filled 2021-01-25 (×5): qty 1

## 2021-01-25 MED ORDER — SENNOSIDES-DOCUSATE SODIUM 8.6-50 MG PO TABS
2.0000 | ORAL_TABLET | Freq: Every day | ORAL | Status: DC
  Administered 2021-01-25 – 2021-01-29 (×5): 2 via ORAL
  Filled 2021-01-25 (×5): qty 2

## 2021-01-25 MED ORDER — PANTOPRAZOLE SODIUM 40 MG PO TBEC
40.0000 mg | DELAYED_RELEASE_TABLET | Freq: Every day | ORAL | Status: DC
  Administered 2021-01-26 – 2021-01-30 (×5): 40 mg via ORAL
  Filled 2021-01-25 (×5): qty 1

## 2021-01-25 MED ORDER — ONDANSETRON HCL 4 MG/2ML IJ SOLN
4.0000 mg | Freq: Four times a day (QID) | INTRAMUSCULAR | Status: DC | PRN
  Administered 2021-01-26: 4 mg via INTRAVENOUS
  Filled 2021-01-25: qty 2

## 2021-01-25 MED ORDER — SODIUM CHLORIDE 0.9 % IV SOLN: 250.0000 mL | INTRAVENOUS | Status: DC | PRN

## 2021-01-25 MED ORDER — SODIUM CHLORIDE 0.9% FLUSH
3.0000 mL | Freq: Two times a day (BID) | INTRAVENOUS | Status: DC
  Administered 2021-01-25 – 2021-01-30 (×10): 3 mL via INTRAVENOUS

## 2021-01-25 MED ORDER — INSULIN ASPART 100 UNIT/ML IJ SOLN
0.0000 [IU] | Freq: Three times a day (TID) | INTRAMUSCULAR | Status: DC
  Administered 2021-01-27 – 2021-01-30 (×6): 1 [IU] via SUBCUTANEOUS

## 2021-01-25 MED ORDER — FUROSEMIDE 10 MG/ML IJ SOLN
80.0000 mg | Freq: Two times a day (BID) | INTRAMUSCULAR | Status: DC
  Administered 2021-01-26: 80 mg via INTRAVENOUS
  Filled 2021-01-25: qty 8

## 2021-01-25 MED ORDER — SODIUM CHLORIDE 0.9% FLUSH: 3.0000 mL | INTRAVENOUS | Status: DC | PRN

## 2021-01-25 MED ORDER — FUROSEMIDE 10 MG/ML IJ SOLN
80.0000 mg | Freq: Once | INTRAMUSCULAR | Status: AC
  Administered 2021-01-25: 80 mg via INTRAVENOUS
  Filled 2021-01-25: qty 8

## 2021-01-25 MED ORDER — ATORVASTATIN CALCIUM 20 MG PO TABS
20.0000 mg | ORAL_TABLET | Freq: Every day | ORAL | Status: DC
  Administered 2021-01-26 – 2021-01-29 (×4): 20 mg via ORAL
  Filled 2021-01-25 (×4): qty 1

## 2021-01-25 MED ORDER — APIXABAN 2.5 MG PO TABS
2.5000 mg | ORAL_TABLET | Freq: Two times a day (BID) | ORAL | Status: DC
  Administered 2021-01-25 – 2021-01-30 (×10): 2.5 mg via ORAL
  Filled 2021-01-25 (×10): qty 1

## 2021-01-25 MED ORDER — INSULIN ASPART 100 UNIT/ML IJ SOLN: 0.0000 [IU] | Freq: Every day | INTRAMUSCULAR | Status: DC

## 2021-01-25 NOTE — ED Triage Notes (Signed)
Abdominal pain x 1 week, states she is tight with fluid

## 2021-01-25 NOTE — ED Provider Notes (Signed)
[facility] EMERGENCY DEPARTMENT Provider Note   CSN: [id number] Arrival date & time: 01/25/21  1345     History Chief Complaint  Patient presents with   Shortness of Breath    Julia Hart is a 87 y.o. female.  She is brought in by family member for increased shortness of breath and left-sided abdominal pain.  She said she has had these symptoms for a while on and off and they worsened again today.  She has been taking her diuretics.  She does not check her weights.  No cough.  No fevers or chills.  No nausea or vomiting.  She has had some loose stools.  No urinary symptoms.  She said her shortness of breath is worse with ambulation.  The history is provided by the patient and a relative.  Shortness of Breath Severity:  Severe Onset quality:  Gradual Duration:  1 day Timing:  Constant Progression:  Unchanged Chronicity:  Recurrent Context: activity   Relieved by:  Nothing Worsened by:  Activity Ineffective treatments:  Rest Associated symptoms: abdominal pain   Associated symptoms: no chest pain, no cough, no fever, no headaches, no neck pain, no rash, no sore throat, no sputum production and no vomiting   Abdominal pain:    Location:  LUQ and LLQ   Severity:  Severe   Onset quality:  Gradual   Duration:  1 day   Timing:  Constant   Progression:  Unchanged   Chronicity:  Recurrent     Past Medical History:  Diagnosis Date   CAD (coronary artery disease)    HTN (hypertension)    Hyperlipidemia     Patient Active Problem List   Diagnosis Date Noted   SOB (shortness of breath)    Acute on chronic combined systolic and diastolic CHF (congestive heart failure) ([place]) 11/22/2020   CKD (chronic kidney disease) stage 3, GFR 30-59 ml/min (HCC) 11/22/2020   Elevated troponin 11/21/2020   AKI (acute kidney injury) ([place]) 11/21/2020   Hyponatremia 11/21/2020   Mild protein-calorie malnutrition ([place]) 11/21/2020   Diabetes mellitus type 2 in nonobese ([place]) 11/21/2020   CHF  (congestive heart failure) ([place]) 11/20/2020   Acute exacerbation of CHF (congestive heart failure) ([place]) 09/15/2020   Acute on chronic systolic CHF (congestive heart failure) ([place]) 08/04/2020   Decompensated heart failure ([place]) 08/03/2020   Coronary artery disease 08/03/2020   HTN (hypertension) 08/03/2020   COVID-19 virus infection 08/03/2020   Atrial fibrillation, chronic ([place]) 08/03/2020   GERD (gastroesophageal reflux disease) 07/20/2020   Melena 06/29/2020   Abdominal pain 06/29/2020   Loss of weight    Anemia 12/19/2016   Change in bowel habits 12/19/2016    Past Surgical History:  Procedure Laterality Date   ABDOMINAL HYSTERECTOMY     BIOPSY  01/13/2017   Procedure: BIOPSY;  Surgeon: Danie Binder, MD;  Location: [facility];  Service: Endoscopy;;  duodenal gastric    COLONOSCOPY N/A 01/13/2017   Procedure: COLONOSCOPY;  Surgeon: Danie Binder, MD;  Location: [facility];  Service: Endoscopy;  Laterality: N/A;  3:00pm-moved to 1:45pm per [place]   CORONARY ANGIOPLASTY WITH STENT PLACEMENT  2018   CORONARY ARTERY BYPASS GRAFT  2008   ESOPHAGOGASTRODUODENOSCOPY N/A 01/13/2017   Procedure: ESOPHAGOGASTRODUODENOSCOPY (EGD);  Surgeon: Danie Binder, MD;  Location: [facility];  Service: Endoscopy;  Laterality: N/A;   ESOPHAGOGASTRODUODENOSCOPY (EGD) WITH PROPOFOL N/A 06/29/2020   Procedure: ESOPHAGOGASTRODUODENOSCOPY (EGD) WITH PROPOFOL;  Surgeon: Daneil Dolin, MD;  Location: [facility];  Service: Endoscopy;  Laterality: N/A;  1:45pm   GIVENS CAPSULE STUDY N/A 09/05/2020   Procedure: GIVENS CAPSULE STUDY;  Surgeon: Eloise Harman, DO;  Location: [facility];  Service: Endoscopy;  Laterality: N/A;  7:30am     OB History   No obstetric history on file.     Family History  Problem Relation Age of Onset   Colon cancer Neg Hx    Colon polyps Neg Hx     Social History   Tobacco Use   Smoking status: Former    Pack years: 0.00   Smokeless tobacco: Never   Vaping Use   Vaping Use: Never used  Substance Use Topics   Alcohol use: No   Drug use: No    Home Medications Prior to Admission medications   Medication Sig Start Date End Date Taking? Authorizing Provider  acetaminophen (TYLENOL) 325 MG tablet Take 2 tablets (650 mg total) by mouth every 6 (six) hours as needed for mild pain, fever or headache (or Fever >/= 101). 09/18/20   Emokpae, Courage, MD  apixaban (ELIQUIS) 2.5 MG TABS tablet Take 1 tablet (2.5 mg total) by mouth 2 (two) times daily. 09/18/20   Roxan Hockey, MD  aspirin EC 81 MG tablet Take 1 tablet (81 mg total) by mouth daily with breakfast. 09/18/20 09/18/21  Roxan Hockey, MD  atorvastatin (LIPITOR) 20 MG tablet Take 20 mg by mouth at bedtime.     [provider]  empagliflozin (JARDIANCE) 10 MG TABS tablet Take 10 mg by mouth daily.    [provider]  ENTRESTO 97-103 MG Take 0.5 tablets by mouth 2 (two) times daily. 09/19/20   Roxan Hockey, MD  feeding supplement (ENSURE ENLIVE / ENSURE PLUS) LIQD Take 237 mLs by mouth 2 (two) times daily. 09/18/20   Roxan Hockey, MD  HYDROcodone-acetaminophen (NORCO/VICODIN) 5-325 MG tablet Take 1 tablet by mouth 3 (three) times daily as needed for severe pain. 12/25/20   Carmin Muskrat, MD  metoprolol succinate (TOPROL-XL) 25 MG 24 hr tablet Take 1 tablet (25 mg total) by mouth daily. 11/30/20   Barton Dubois, MD  metoprolol tartrate (LOPRESSOR) 25 MG tablet Take 25 mg by mouth every morning. 12/20/20   [provider]  nitroGLYCERIN (NITROSTAT) 0.4 MG SL tablet Place 0.4 mg under the tongue every 5 (five) minutes as needed for chest pain.    [provider]  pantoprazole (PROTONIX) 40 MG tablet Take 40 mg by mouth daily. 05/17/20   [provider]  polyethylene glycol (MIRALAX / GLYCOLAX) 17 g packet Take 17 g by mouth daily as needed for mild constipation. 09/18/20   Roxan Hockey, MD  prednisoLONE acetate (PRED FORTE) 1 % ophthalmic  suspension Place 1 drop into both eyes every other day. 03/15/20   [provider]  senna-docusate (SENOKOT-S) 8.6-50 MG tablet Take 2 tablets by mouth at bedtime. 09/18/20 09/18/21  Roxan Hockey, MD  torsemide 40 MG TABS Take 40 mg by mouth daily. 11/29/20   Barton Dubois, MD  triamcinolone cream (KENALOG) 0.1 % Apply topically 2 (two) times daily. 12/14/20   [provider]    Allergies    Patient has no known allergies.  Review of Systems   Review of Systems  Constitutional:  Negative for fever.  HENT:  Negative for sore throat.   Eyes:  Negative for visual disturbance.  Respiratory:  Positive for shortness of breath. Negative for cough and sputum production.   Cardiovascular:  Negative for chest  pain.  Gastrointestinal:  Positive for abdominal pain and diarrhea. Negative for vomiting.  Genitourinary:  Negative for dysuria.  Musculoskeletal:  Negative for neck pain.  Skin:  Negative for rash.  Neurological:  Negative for headaches.   Physical Exam Updated Vital Signs BP 125/61 (BP Location: Right Arm)   Pulse 87   Temp (!) 97.5 F (36.4 C) (Oral)   Resp 18   Ht 5\' 6"  (1.676 m)   Wt 60.7 kg   SpO2 100%   BMI 21.60 kg/m   Physical Exam Vitals and nursing note reviewed.  Constitutional:      General: She is not in acute distress.    Appearance: Normal appearance. She is well-developed.  HENT:     Head: Normocephalic and atraumatic.  Eyes:     Conjunctiva/sclera: Conjunctivae normal.  Cardiovascular:     Rate and Rhythm: Normal rate and regular rhythm.     Heart sounds: No murmur heard. Pulmonary:     Effort: Pulmonary effort is normal. No respiratory distress.     Breath sounds: Normal breath sounds.  Abdominal:     Palpations: Abdomen is soft.     Tenderness: There is no abdominal tenderness. There is no guarding or rebound.  Musculoskeletal:        General: No tenderness.     Cervical back: Neck supple.     Right lower leg: Edema present.      Left lower leg: Edema present.  Skin:    General: Skin is warm and dry.  Neurological:     General: No focal deficit present.     Mental Status: She is alert. Mental status is at baseline.    ED Results / Procedures / Treatments   Labs (all labs ordered are listed, but only abnormal results are displayed) Labs Reviewed  COMPREHENSIVE METABOLIC PANEL - Abnormal; Notable for the following components:      Result Value   Potassium 3.2 (*)    Glucose, Bld 122 (*)    BUN 49 (*)    Creatinine, Ser 2.01 (*)    Calcium 8.5 (*)    Albumin 3.3 (*)    GFR, Estimated 23 (*)    All other components within normal limits  CBC - Abnormal; Notable for the following components:   WBC 3.5 (*)    Hemoglobin 10.2 (*)    HCT 32.4 (*)    RDW 16.0 (*)    All other components within normal limits  BRAIN NATRIURETIC PEPTIDE - Abnormal; Notable for the following components:   B Natriuretic Peptide >4,500.0 (*)    All other components within normal limits  BASIC METABOLIC PANEL - Abnormal; Notable for the following components:   Potassium 3.3 (*)    BUN 53 (*)    Creatinine, Ser 2.08 (*)    Calcium 8.7 (*)    GFR, Estimated 22 (*)    All other components within normal limits  TROPONIN I (HIGH SENSITIVITY) - Abnormal; Notable for the following components:   Troponin I (High Sensitivity) 74 (*)    All other components within normal limits  TROPONIN I (HIGH SENSITIVITY) - Abnormal; Notable for the following components:   Troponin I (High Sensitivity) 78 (*)    All other components within normal limits  RESP PANEL BY RT-PCR (FLU A&B, COVID) ARPGX2  LIPASE, BLOOD  MAGNESIUM  GLUCOSE, CAPILLARY  GLUCOSE, CAPILLARY  GLUCOSE, CAPILLARY  URINALYSIS, ROUTINE W REFLEX MICROSCOPIC    EKG EKG Interpretation  Date/Time:  Thursday January 25 2021 14:37:31 EDT Ventricular Rate:  89 PR Interval:  170 QRS Duration: 98 QT Interval:  396 QTC Calculation: 481 R Axis:   223 Text Interpretation: Normal  sinus rhythm Right superior axis deviation Incomplete right bundle branch block Right ventricular hypertrophy Inferior infarct , age undetermined Cannot rule out Anterior infarct , age undetermined Abnormal ECG No significant change since prior 2/22 Confirmed by Aletta Edouard [phone]) on 01/25/2021 2:54:11 PM  Radiology DG Chest 1 View  Result Date: 01/25/2021 CLINICAL DATA:  Shortness of breath.  Left-sided abdominal pain. EXAM: CHEST  1 VIEW COMPARISON:  Chest x-ray 11/20/2020 FINDINGS: Enlarged cardiac silhouette. The heart size and mediastinal contours are unchanged. Aortic arch calcification. Right neck surgical changes overlie the mediastinum. No focal consolidation. Persistent coarsened interstitial markings with no overt pulmonary edema. Blunting of bilateral costophrenic angles with no definite pleural effusion. No pneumothorax. No acute osseous abnormality. IMPRESSION: No active disease in a patient with persistent enlarged cardiac silhouette. Electronically Signed   By: Iven Finn M.D.   On: 01/25/2021 15:40    Procedures Procedures   Medications Ordered in ED Medications  atorvastatin (LIPITOR) tablet 20 mg (has no administration in time range)  metoprolol tartrate (LOPRESSOR) tablet 25 mg (25 mg Oral Given 01/26/21 0848)  pantoprazole (PROTONIX) EC tablet 40 mg (40 mg Oral Given 01/26/21 0847)  senna-docusate (Senokot-S) tablet 2 tablet (2 tablets Oral Given 01/25/21 2159)  apixaban (ELIQUIS) tablet 2.5 mg (2.5 mg Oral Given 01/26/21 0846)  sodium chloride flush (NS) 0.9 % injection 3 mL (3 mLs Intravenous Given 01/25/21 2200)  sodium chloride flush (NS) 0.9 % injection 3 mL (has no administration in time range)  0.9 %  sodium chloride infusion (has no administration in time range)  acetaminophen (TYLENOL) tablet 650 mg (650 mg Oral Given 01/25/21 2338)  ondansetron (ZOFRAN) injection 4 mg (has no administration in time range)  insulin aspart (novoLOG) injection 0-9 Units (0 Units  Subcutaneous Not Given 01/26/21 0853)  insulin aspart (novoLOG) injection 0-5 Units (0 Units Subcutaneous Not Given 01/25/21 2200)  magnesium sulfate IVPB 2 g 50 mL (2 g Intravenous New Bag/Given 01/26/21 1054)  aspirin EC tablet 81 mg (has no administration in time range)  feeding supplement (ENSURE ENLIVE / ENSURE PLUS) liquid 237 mL (237 mLs Oral Given 01/26/21 1050)  HYDROcodone-acetaminophen (NORCO/VICODIN) 5-325 MG per tablet 1 tablet (has no administration in time range)  nitroGLYCERIN (NITROSTAT) SL tablet 0.4 mg (has no administration in time range)  polyethylene glycol (MIRALAX / GLYCOLAX) packet 17 g (has no administration in time range)  spironolactone (ALDACTONE) tablet 12.5 mg (12.5 mg Oral Given 01/26/21 1040)  furosemide (LASIX) injection 40 mg (has no administration in time range)  furosemide (LASIX) injection 80 mg (80 mg Intravenous Given 01/25/21 1658)  potassium chloride SA (KLOR-CON) CR tablet 40 mEq (40 mEq Oral Given 01/25/21 1658)  potassium chloride SA (KLOR-CON) CR tablet 40 mEq (40 mEq Oral Given 01/26/21 1044)    ED Course  I have reviewed the triage vital signs and the nursing notes.  Pertinent labs & imaging results that were available during my care of the patient were reviewed by me and considered in my medical decision making (see chart for details).  Clinical Course as of 01/26/21 1131  Thu Jan 25, 2021  1533 Chest x-ray interpreted by me as cardiomegaly no gross infiltrates.  Awaiting radiology reading [MB]  1702 Discussed with [facility] hospitalist Dr. Waldron Labs who will evaluate the patient for admission. [MB]  Clinical Course User Index [MB] Hayden Rasmussen, MD   MDM Rules/Calculators/A&P                         This patient complains of dyspnea on exertion, left-sided abdominal pain, pedal edema; this involves an extensive number of treatment Options and is a complaint that carries with it a high risk of complications and Morbidity. The differential includes  CHF, COPD, ACS, intra-abdominal process, renal failure, metabolic derangement  I ordered, reviewed and interpreted labs, which included CBC with low white count similar to priors, hemoglobin low stable from priors, chemistries with low potassium and elevated creatinine from priors troponins elevated will need to be trended, COVID and flu testing negative, BNP markedly elevated I ordered medication IV Lasix and oral potassium I ordered imaging studies which included chest x-ray and I independently    visualized and interpreted imaging which showed cardiomegaly Additional history obtained from patient's niece Previous records obtained and reviewed in epic, patient has been admitted before for CHF exacerbations. I consulted [facility] hospitalist Dr. Waldron Labs and discussed lab and imaging findings  Critical Interventions: None  After the interventions stated above, I reevaluated the patient and found patient to be hemodynamically stable although symptomatic.  Due to her rising creatinines in the setting of her oral diuretics feel she would be benefited by the admitted to the hospital for IV diuresis with careful monitoring.  She is agreeable to plan. CHA2DS2/VAS Stroke Risk Points  Current as of about an hour ago     7 >= 2 Points: High Risk  1 - 1.99 Points: Medium Risk  0 Points: Low Risk    Last Change:       Details    This score determines the patient's risk of having a stroke if the  patient has atrial fibrillation.       Points Metrics  1 Has Congestive Heart Failure:  Yes    Current as of about an hour ago  1 Has Vascular Disease:  Yes    Current as of about an hour ago  1 Has Hypertension:  Yes    Current as of about an hour ago  2 Age:  33    Current as of about an hour ago  1 Has Diabetes:  Yes    Current as of about an hour ago  0 Had Stroke:  No  Had TIA:  No  Had Thromboembolism:  No    Current as of about an hour ago  1 Female:  Yes    Current as of about an hour ago             Final Clinical Impression(s) / ED Diagnoses Final diagnoses:  Acute on chronic combined systolic and diastolic CHF (congestive heart failure) (HCC)  Hypokalemia  AKI (acute kidney injury) ([place])  Atrial fibrillation, unspecified type [facility])    Rx / DC Orders ED Discharge Orders     None        Hayden Rasmussen, MD 01/26/21 1136

## 2021-01-25 NOTE — H&P (Addendum)
TRH H&P   Patient Demographics:    Julia Hart, is a 87 y.o. female  MRN: [id number]   DOB - 20-May-1927  Admit Date - 01/25/2021  Outpatient Primary MD for the patient is [name], MD  Referring MD/NP/PA: Dr Melina Copa  Outpatient Specialists: cardiology in [place],    Patient coming from: Home  Chief Complaint  Patient presents with   Shortness of Breath      HPI:    Julia Hart  is a 87 y.o. female, with a history of coronary artery disease/NSTEMI, hypertension, hyperlipidemia, ischemic cardiomyopathy with previous EF 35%, improved to 55% during previous admissions, patient with recent hospitalization in May due to to congestive heart failure episode improved with diuresis. -Patient was brought in by her family due to increased shortness of breath, some abdominal pain, patient reports she has some chronic dyspnea, off and on for a while, but it did much worsened today, ports she has been compliant with her medications, including diuretics, he does report worsening lower extremity edema over the last 2 days, ports she does not check her weight, notes her dyspnea worsened with activity, she denies cough, fever, hemoptysis or chest pain, she does report some abdominal pain with diarrhea as well. - in ED NP significantly elevated> 4500, chest x-ray significant for heart failure picture, but no pulmonary edema, her creatinine has increased to 2 from baseline of 1.6, her lipase within normal limit, [facility] hospitalist consulted to admit.  Review of systems:    In addition to the HPI above, No Fever-chills, No Headache, No changes with Vision or hearing, No problems swallowing food or Liquids, No Chest pain, Cough, she does report shortness of breath, Reports abdominal pain and diarrhea, no nausea or vomiting No Blood in stool or Urine, No dysuria, No new skin rashes or  bruises, No new joints pains-aches,  No new weakness, tingling, numbness in any extremity, No recent weight gain or loss, No polyuria, polydypsia or polyphagia, No significant Mental Stressors.  A full 10 point Review of Systems was done, except as stated above, all other Review of Systems were negative.   With Past History of the following :    Past Medical History:  Diagnosis Date   CAD (coronary artery disease)    HTN (hypertension)    Hyperlipidemia       Past Surgical History:  Procedure Laterality Date   ABDOMINAL HYSTERECTOMY     BIOPSY  01/13/2017   Procedure: BIOPSY;  Surgeon: Danie Binder, MD;  [facility];  Service: Endoscopy;;  duodenal gastric    COLONOSCOPY N/A 01/13/2017   Procedure: COLONOSCOPY;  Surgeon: Danie Binder, MD;  Location: [facility];  Service: Endoscopy;  Laterality: N/A;  3:00pm-moved to 1:45pm per Ginger   CORONARY ANGIOPLASTY WITH STENT PLACEMENT  2018   CORONARY ARTERY BYPASS GRAFT  2008   ESOPHAGOGASTRODUODENOSCOPY N/A 01/13/2017  Procedure: ESOPHAGOGASTRODUODENOSCOPY (EGD);  Surgeon: Danie Binder, MD;  Location: [facility];  Service: Endoscopy;  Laterality: N/A;   ESOPHAGOGASTRODUODENOSCOPY (EGD) WITH PROPOFOL N/A 06/29/2020   Procedure: ESOPHAGOGASTRODUODENOSCOPY (EGD) WITH PROPOFOL;  Surgeon: Daneil Dolin, MD;  Location: [facility];  Service: Endoscopy;  Laterality: N/A;  1:45pm   GIVENS CAPSULE STUDY N/A 09/05/2020   Procedure: GIVENS CAPSULE STUDY;  Surgeon: Eloise Harman, DO;  Location: [facility];  Service: Endoscopy;  Laterality: N/A;  7:30am      Social History:     Social History   Tobacco Use   Smoking status: Former    Pack years: 0.00   Smokeless tobacco: Never  Substance Use Topics   Alcohol use: No       Family History :     Family History  Problem Relation Age of Onset   Colon cancer Neg Hx    Colon polyps Neg Hx       Home Medications:   Prior to Admission  medications   Medication Sig Start Date End Date Taking? Authorizing Provider  acetaminophen (TYLENOL) 325 MG tablet Take 2 tablets (650 mg total) by mouth every 6 (six) hours as needed for mild pain, fever or headache (or Fever >/= 101). 09/18/20   Emokpae, Courage, MD  apixaban (ELIQUIS) 2.5 MG TABS tablet Take 1 tablet (2.5 mg total) by mouth 2 (two) times daily. 09/18/20   Roxan Hockey, MD  aspirin EC 81 MG tablet Take 1 tablet (81 mg total) by mouth daily with breakfast. 09/18/20 09/18/21  Roxan Hockey, MD  atorvastatin (LIPITOR) 20 MG tablet Take 20 mg by mouth at bedtime.     [provider]  empagliflozin (JARDIANCE) 10 MG TABS tablet Take 10 mg by mouth daily.    [provider]  ENTRESTO 97-103 MG Take 0.5 tablets by mouth 2 (two) times daily. 09/19/20   Roxan Hockey, MD  feeding supplement (ENSURE ENLIVE / ENSURE PLUS) LIQD Take 237 mLs by mouth 2 (two) times daily. 09/18/20   Roxan Hockey, MD  HYDROcodone-acetaminophen (NORCO/VICODIN) 5-325 MG tablet Take 1 tablet by mouth 3 (three) times daily as needed for severe pain. 12/25/20   Carmin Muskrat, MD  metoprolol succinate (TOPROL-XL) 25 MG 24 hr tablet Take 1 tablet (25 mg total) by mouth daily. 11/30/20   Barton Dubois, MD  metoprolol tartrate (LOPRESSOR) 25 MG tablet Take 25 mg by mouth every morning. 12/20/20   [provider]  nitroGLYCERIN (NITROSTAT) 0.4 MG SL tablet Place 0.4 mg under the tongue every 5 (five) minutes as needed for chest pain.    [provider]  pantoprazole (PROTONIX) 40 MG tablet Take 40 mg by mouth daily. 05/17/20   [provider]  polyethylene glycol (MIRALAX / GLYCOLAX) 17 g packet Take 17 g by mouth daily as needed for mild constipation. 09/18/20   Roxan Hockey, MD  prednisoLONE acetate (PRED FORTE) 1 % ophthalmic suspension Place 1 drop into both eyes every other day. 03/15/20   [provider]  senna-docusate (SENOKOT-S) 8.6-50 MG tablet Take  2 tablets by mouth at bedtime. 09/18/20 09/18/21  Roxan Hockey, MD  spironolactone (ALDACTONE) 25 MG tablet Take 12.5 mg by mouth daily. 01/15/21   [provider]  torsemide (DEMADEX) 20 MG tablet Take 20 mg by mouth 2 (two) times daily. 01/04/21   [provider]  torsemide 40 MG TABS Take 40 mg by mouth daily. 11/29/20   Barton Dubois, MD  triamcinolone cream (KENALOG)  0.1 % Apply topically 2 (two) times daily. 12/14/20   [provider]     Allergies:    No Known Allergies   Physical Exam:   Vitals  Blood pressure 128/65, pulse 88, resp. rate 18, height 5\' 6"  (1.676 m), weight 54 kg, SpO2 100 %.   1. General frail, chronically ill-appearing female, in discomfort due to dyspnea  2. Normal affect and insight, Not Suicidal or Homicidal, Awake Alert, Oriented X 3.  3. No F.N deficits, ALL C.Nerves Intact, Strength 5/5 all 4 extremities, Sensation intact all 4 extremities, Plantars down going.  4. Ears and Eyes appear Normal, Conjunctivae clear, PERRLA. Moist Oral Mucosa.  5. Supple Neck, JVD extended all the way to the upper jaws, No cervical lymphadenopathy appriciated, No Carotid Bruits.  6. Symmetrical Chest wall movement, he is mildly tachypneic, with some use of accessory muscles  7. RRR, murmur present with mild gallop, S2 lower extremity edema  8. Positive Bowel Sounds, Abdomen Soft, No tenderness, No organomegaly appriciated,No rebound -guarding or rigidity.  9.  No Cyanosis, Normal Skin Turgor, No Skin Rash or Bruise.  10. Good muscle tone,  joints appear normal , no effusions, Normal ROM.  11. No Palpable Lymph Nodes in Neck or Axillae     Data Review:    CBC Recent Labs  Lab 01/25/21 1447  WBC 3.5*  HGB 10.2*  HCT 32.4*  PLT 190  MCV 83.3  MCH 26.2  MCHC 31.5  RDW 16.0*   ------------------------------------------------------------------------------------------------------------------  Chemistries  Recent Labs  Lab  01/25/21 1447  NA 137  K 3.2*  CL 103  CO2 25  GLUCOSE 122*  BUN 49*  CREATININE 2.01*  CALCIUM 8.5*  MG 1.8  AST 33  ALT 14  ALKPHOS 92  BILITOT 1.0   ------------------------------------------------------------------------------------------------------------------ estimated creatinine clearance is 15.2 mL/min (A) (by C-G formula based on SCr of 2.01 mg/dL (H)). ------------------------------------------------------------------------------------------------------------------ No results for input(s): TSH, T4TOTAL, T3FREE, THYROIDAB in the last 72 hours.  Invalid input(s): FREET3  Coagulation profile No results for input(s): INR, PROTIME in the last 168 hours. ------------------------------------------------------------------------------------------------------------------- No results for input(s): DDIMER in the last 72 hours. -------------------------------------------------------------------------------------------------------------------  Cardiac Enzymes No results for input(s): CKMB, TROPONINI, MYOGLOBIN in the last 168 hours.  Invalid input(s): CK ------------------------------------------------------------------------------------------------------------------    Component Value Date/Time   BNP >4,500.0 (H) 01/25/2021 1447     ---------------------------------------------------------------------------------------------------------------  Urinalysis    Component Value Date/Time   COLORURINE YELLOW 12/25/2020 2055   APPEARANCEUR HAZY (A) 12/25/2020 2055   LABSPEC 1.012 12/25/2020 2055   PHURINE 5.0 12/25/2020 2055   GLUCOSEU 150 (A) 12/25/2020 2055   HGBUR MODERATE (A) 12/25/2020 2055   BILIRUBINUR NEGATIVE 12/25/2020 2055   [place] 12/25/2020 2055   PROTEINUR 100 (A) 12/25/2020 2055   NITRITE NEGATIVE 12/25/2020 2055   LEUKOCYTESUR TRACE (A) 12/25/2020 2055     ----------------------------------------------------------------------------------------------------------------   Imaging Results:    DG Chest 1 View  Result Date: 01/25/2021 CLINICAL DATA:  Shortness of breath.  Left-sided abdominal pain. EXAM: CHEST  1 VIEW COMPARISON:  Chest x-ray 11/20/2020 FINDINGS: Enlarged cardiac silhouette. The heart size and mediastinal contours are unchanged. Aortic arch calcification. Right neck surgical changes overlie the mediastinum. No focal consolidation. Persistent coarsened interstitial markings with no overt pulmonary edema. Blunting of bilateral costophrenic angles with no definite pleural effusion. No pneumothorax. No acute osseous abnormality. IMPRESSION: No active disease in a patient with persistent enlarged cardiac silhouette. Electronically Signed   By: Clelia Croft.D.  On: 01/25/2021 15:40    My personal review of EKG: Rhythm NSR, Vent. rate 92 BPM PR interval 168 ms QRS duration 108 ms QT/QTcB 430/531 ms P-R-T axes 264 218 18 Unusual P axis, possible ectopic atrial rhythm with occasional Premature ventricular complexes Right superior axis deviation Low voltage QRS Inferior infarct , age undetermined    Assessment & Plan:    Active Problems:   Atrial fibrillation, chronic (HCC)   AKI (acute kidney injury) ([place])   Diabetes mellitus type 2 in nonobese (HCC)   CKD (chronic kidney disease) stage 3, GFR 30-59 ml/min (HCC)   Acute on chronic diastolic CHF (congestive heart failure) (HCC)     Acute on chronic diastolic/systolic congestive heart failure with severe right ventricle dysfunction and tricuspid regurgitation . -Most recent echo showing improved EF 55% on May/22, she is with previous known echo with EF as low as 35% . -Presents with worsening lower extremity edema, JVD all the way to the dose, significantly dyspneic, she will be admitted to telemetry, and started on IV diuresis 80 mg twice daily, continue daily weight, strict  ins and out. -Blood pressure is soft, and with worsening renal function, so we will hold all cardiac medications and continue only with metoprolol blood pressure allows. -Please see below discussion under goals of care.   Persistent atrial fibrillation.   - CHA2DS2-VASc score is 6.  .Continue with Eliquis s -Continue with metoprolol for heart rate control .  CAD status post CABG  -Continue with statin, beta-blockers, and aspirin .  Hypertension -Blood pressure on the lower side, will keep currently only on metoprolol, most of her medications were stopped during previous admissions due to worsening renal function and soft blood pressure  AKI in CKD stage IIIb -Creatinine elevated at 2, avoid hypotension, will monitor closely as on IV diuresis .  hyperlipidemia -Continue statins.  Diabetes mellitus - We will hold home medications and keep on insulin sliding scale during hospital stay   GERD -Continue PPI.  Goals of  care discussion -Have discussed with patient, her niece Hassan Rowan by phone, and another niece Elmyra Ricks at bedside, they do understand that her age, severe right heart failure, multiple comorbidities, and severe tricuspid valve disease , and worsening renal failure, overall intermediate to long-term prognosis is poor, visually with her age and multiple comorbidities and they do want current medical management, but if no improvement they want main goal is to be comfort, so I will have palliative medicine involved as well.  DVT Prophylaxis Eliquis   AM Labs Ordered, also please review Full Orders  Family Communication: Admission, patients condition and plan of care including tests being ordered have been discussed with the patient and niece Hassan Rowan and Gaspar Garbe who indicate understanding and agree with the plan and Code Status.  Code Status DNR  Likely DC to  home  Condition GUARDED    Consults called: none    Admission status: inpatient  Time spent in minutes : 65  minutes   Phillips Climes M.D on 01/25/2021 at 4:48 PM   [facility] - Office  [phone]

## 2021-01-25 NOTE — ED Notes (Signed)
Gone to [employer]

## 2021-01-26 ENCOUNTER — Encounter (HOSPITAL_COMMUNITY): Payer: Self-pay | Admitting: Internal Medicine

## 2021-01-26 DIAGNOSIS — Z7189 Other specified counseling: Secondary | ICD-10-CM

## 2021-01-26 DIAGNOSIS — I482 Chronic atrial fibrillation, unspecified: Secondary | ICD-10-CM

## 2021-01-26 DIAGNOSIS — N179 Acute kidney failure, unspecified: Secondary | ICD-10-CM

## 2021-01-26 DIAGNOSIS — I5033 Acute on chronic diastolic (congestive) heart failure: Secondary | ICD-10-CM

## 2021-01-26 DIAGNOSIS — E119 Type 2 diabetes mellitus without complications: Secondary | ICD-10-CM

## 2021-01-26 DIAGNOSIS — N1832 Chronic kidney disease, stage 3b: Secondary | ICD-10-CM

## 2021-01-26 DIAGNOSIS — Z515 Encounter for palliative care: Secondary | ICD-10-CM

## 2021-01-26 LAB — URINALYSIS, ROUTINE W REFLEX MICROSCOPIC
Bilirubin Urine: NEGATIVE
Glucose, UA: 50 mg/dL — AB
Ketones, ur: NEGATIVE mg/dL
Nitrite: NEGATIVE
Protein, ur: 30 mg/dL — AB
Specific Gravity, Urine: 1.009 (ref 1.005–1.030)
pH: 5 (ref 5.0–8.0)

## 2021-01-26 LAB — GLUCOSE, CAPILLARY
Glucose-Capillary: 92 mg/dL (ref 70–99)
Glucose-Capillary: 91 mg/dL (ref 70–99)
Glucose-Capillary: 115 mg/dL — ABNORMAL HIGH (ref 70–99)
Glucose-Capillary: 156 mg/dL — ABNORMAL HIGH (ref 70–99)

## 2021-01-26 LAB — BASIC METABOLIC PANEL
Anion gap: 11 (ref 5–15)
BUN: 53 mg/dL — ABNORMAL HIGH (ref 8–23)
CO2: 25 mmol/L (ref 22–32)
Calcium: 8.7 mg/dL — ABNORMAL LOW (ref 8.9–10.3)
Chloride: 101 mmol/L (ref 98–111)
Creatinine, Ser: 2.08 mg/dL — ABNORMAL HIGH (ref 0.44–1.00)
GFR, Estimated: 22 mL/min — ABNORMAL LOW (ref >60)
Glucose, Bld: 88 mg/dL (ref 70–99)
Potassium: 3.3 mmol/L — ABNORMAL LOW (ref 3.5–5.1)
Sodium: 137 mmol/L (ref 135–145)

## 2021-01-26 MED ORDER — FUROSEMIDE 10 MG/ML IJ SOLN
40.0000 mg | Freq: Two times a day (BID) | INTRAMUSCULAR | Status: DC
Start: 2021-01-26 — End: 2021-01-28
  Administered 2021-01-26 – 2021-01-28 (×4): 40 mg via INTRAVENOUS
  Filled 2021-01-26 (×4): qty 4

## 2021-01-26 MED ORDER — SPIRONOLACTONE 12.5 MG HALF TABLET
12.5000 mg | ORAL_TABLET | Freq: Every day | ORAL | Status: DC
  Administered 2021-01-26 – 2021-01-30 (×5): 12.5 mg via ORAL
  Filled 2021-01-26 (×6): qty 1

## 2021-01-26 MED ORDER — MAGNESIUM SULFATE 2 GM/50ML IV SOLN
2.0000 g | Freq: Once | INTRAVENOUS | Status: AC
  Administered 2021-01-26: 2 g via INTRAVENOUS
  Filled 2021-01-26: qty 50

## 2021-01-26 MED ORDER — NITROGLYCERIN 0.4 MG SL SUBL: 0.4000 mg | SUBLINGUAL_TABLET | SUBLINGUAL | Status: DC | PRN

## 2021-01-26 MED ORDER — EMPAGLIFLOZIN 10 MG PO TABS: 10.0000 mg | ORAL_TABLET | Freq: Every morning | ORAL | Status: DC

## 2021-01-26 MED ORDER — POTASSIUM CHLORIDE CRYS ER 20 MEQ PO TBCR
40.0000 meq | EXTENDED_RELEASE_TABLET | Freq: Once | ORAL | Status: AC
  Administered 2021-01-26: 40 meq via ORAL
  Filled 2021-01-26: qty 2

## 2021-01-26 MED ORDER — POLYETHYLENE GLYCOL 3350 17 G PO PACK
17.0000 g | PACK | Freq: Every day | ORAL | Status: DC | PRN
  Administered 2021-01-29: 17 g via ORAL
  Filled 2021-01-26: qty 1

## 2021-01-26 MED ORDER — HYDROCODONE-ACETAMINOPHEN 5-325 MG PO TABS
1.0000 | ORAL_TABLET | Freq: Three times a day (TID) | ORAL | Status: DC | PRN
  Filled 2021-01-26: qty 1

## 2021-01-26 MED ORDER — ASPIRIN EC 81 MG PO TBEC
81.0000 mg | DELAYED_RELEASE_TABLET | Freq: Every day | ORAL | Status: DC
  Administered 2021-01-27 – 2021-01-30 (×4): 81 mg via ORAL
  Filled 2021-01-26 (×4): qty 1

## 2021-01-26 MED ORDER — ENSURE ENLIVE PO LIQD
237.0000 mL | Freq: Two times a day (BID) | ORAL | Status: DC
Start: 2021-01-26 — End: 2021-01-30
  Administered 2021-01-26 – 2021-01-30 (×8): 237 mL via ORAL

## 2021-01-26 NOTE — Evaluation (Signed)
Physical Therapy Evaluation Patient Details Name: Julia Hart MRN: [id number] DOB: [birth year] Today's Date: 01/26/2021   History of Present Illness  87 y.o. female, with a history of coronary artery disease/NSTEMI, hypertension, hyperlipidemia, ischemic cardiomyopathy with previous EF 35%, improved to 55% during previous admissions, patient with recent hospitalization in May due to to congestive heart failure episode improved with diuresis.  Patient was brought in by her family due to increased shortness of breath, some abdominal pain, patient reports she has some chronic dyspnea, off and on for a while, but it did much worsened today, ports she has been compliant with her medications, including diuretics, he does report worsening lower extremity edema over the last 2 days, ports she does not check her weight, notes her dyspnea worsened with activity, she denies cough, fever, hemoptysis or chest pain, she does report some abdominal pain with diarrhea as well.  in ED NP significantly elevated> 4500, chest x-ray significant for heart failure picture, but no pulmonary edema, her creatinine has increased to 2 from baseline of 1.6, her lipase within normal limit, [facility] hospitalist consulted to admit.   Clinical Impression  Patient limited for functional mobility as stated below secondary to BLE weakness, fatigue and impaired standing balance. Patient seated in chair at beginning of session. Patient overall fatigued but is willing to participate. Patient transfers to standing with min g/a with RW. She ambulates in room with RW with slow, labored cadence.  She is limited by fatigue and impaired activity tolerance today. Patient will benefit from continued physical therapy in hospital and recommended venue below to increase strength, balance, endurance for safe ADLs and gait.     Follow Up Recommendations Supervision/Assistance - 24 hour;Supervision for mobility/OOB;SNF    Equipment Recommendations  None  recommended by PT    Recommendations for Other Services       Precautions / Restrictions Precautions Precautions: Fall Restrictions Weight Bearing Restrictions: No      Mobility  Bed Mobility               General bed mobility comments: seated in chair at beginning of session    Transfers Overall transfer level: Needs assistance Equipment used: Rolling walker (2 wheeled) Transfers: Sit to/from [facility] Sit to Stand: Min guard;Min assist Stand pivot transfers: Min guard       General transfer comment: slow transition to standing with RW  Ambulation/Gait Ambulation/Gait assistance: Min guard Gait Distance (Feet): 25 Feet Assistive device: Rolling walker (2 wheeled) Gait Pattern/deviations: Step-through pattern;Decreased stride length Gait velocity: decreased   General Gait Details: slow, labored cadence in room with RW  Stairs            Wheelchair Mobility    Modified Rankin (Stroke Patients Only)       Balance Overall balance assessment: Needs assistance Sitting-balance support: Feet supported Sitting balance-Leahy Scale: Good Sitting balance - Comments: seated edge of chair   Standing balance support: Bilateral upper extremity supported Standing balance-Leahy Scale: Fair Standing balance comment: fair/good with RW                             Pertinent Vitals/Pain Pain Assessment: No/denies pain    Home Living Family/patient expects to be discharged to:: Private residence Living Arrangements: Other relatives Available Help at Discharge: Family;Available PRN/intermittently Type of Home: House Home Access: Stairs to enter   [employer] of Steps: 2 steps with posts to hold on to Home Layout: One  level Home Equipment: Walker - 2 wheels;Cane - single point;Grab bars - tub/shower;Bedside commode      Prior Function Level of Independence: Needs assistance   Gait / Transfers Assistance Needed:  Patient states SPC use for houshold mobility  ADL's / Homemaking Assistance Needed: patient states family assists PRN, ind with basic ADL        Hand Dominance        Extremity/Trunk Assessment   Upper Extremity Assessment Upper Extremity Assessment: Generalized weakness    Lower Extremity Assessment Lower Extremity Assessment: Generalized weakness    Cervical / Trunk Assessment Cervical / Trunk Assessment: Normal  Communication   Communication: No difficulties  Cognition Arousal/Alertness: Awake/alert Behavior During Therapy: WFL for tasks assessed/performed Overall Cognitive Status: Within Functional Limits for tasks assessed                                        General Comments      Exercises     Assessment/Plan    PT Assessment Patient needs continued PT services  PT Problem List Decreased strength;Decreased mobility;Decreased activity tolerance;Decreased balance       PT Treatment Interventions DME instruction;Therapeutic exercise;Gait training;Balance training;Stair training;Neuromuscular re-education;Functional mobility training;Therapeutic activities;Patient/family education    PT Goals (Current goals can be found in the Care Plan section)  Acute Rehab PT Goals Patient Stated Goal: Return home PT Goal Formulation: With patient Time For Goal Achievement: 02/09/21 Potential to Achieve Goals: Good    Frequency Min 3X/week   Barriers to discharge        Co-evaluation               AM-PAC PT "6 Clicks" Mobility  Outcome Measure Help needed turning from your back to your side while in a flat bed without using bedrails?: None Help needed moving from lying on your back to sitting on the side of a flat bed without using bedrails?: A Little Help needed moving to and from a bed to a chair (including a wheelchair)?: A Little Help needed standing up from a chair using your arms (e.g., wheelchair or bedside chair)?: A Little Help  needed to walk in hospital room?: A Little Help needed climbing 3-5 steps with a railing? : A Lot 6 Click Score: 18    End of Session Equipment Utilized During Treatment: Gait belt Activity Tolerance: Patient tolerated treatment well;Patient limited by fatigue Patient left: in chair;with call bell/phone within reach;with chair alarm set Nurse Communication: Mobility status PT Visit Diagnosis: Unsteadiness on feet (R26.81);Other abnormalities of gait and mobility (R26.89);Muscle weakness (generalized) (M62.81)    Time: 1359-1410 PT Time Calculation (min) (ACUTE ONLY): 11 min   Charges:   PT Evaluation $PT Eval Low Complexity: 1 Low          2:28 PM, 01/26/21 Mearl Latin PT, DPT Physical Therapist at [facility]

## 2021-01-26 NOTE — Plan of Care (Signed)
  Problem: Acute Rehab PT Goals(only PT should resolve) Goal: Patient Will Transfer Sit To/From Stand Outcome: Progressing Flowsheets (Taken 01/26/2021 1429) Patient will transfer sit to/from stand: with supervision Goal: Pt Will Transfer Bed To Chair/Chair To Bed Outcome: Progressing Flowsheets (Taken 01/26/2021 1429) Pt will Transfer Bed to Chair/Chair to Bed: with supervision Goal: Pt Will Ambulate Outcome: Progressing Flowsheets (Taken 01/26/2021 1429) Pt will Ambulate:  50 feet  with min guard assist  with least restrictive assistive device Goal: Pt/caregiver will Perform Home Exercise Program Outcome: Progressing Flowsheets (Taken 01/26/2021 1429) Pt/caregiver will Perform Home Exercise Program:  For increased strengthening  For improved balance  Independently   2:30 PM, 01/26/21 Mearl Latin PT, DPT Physical Therapist at [facility]

## 2021-01-26 NOTE — Consult Note (Signed)
Consultation Note Date: 01/26/2021   Patient Name: Julia Hart  DOB: [birth year]  MRN: [id number]  Age / Sex: 87 y.o., female  PCP: [name], MD Referring Physician: Murlean Iba, MD  Reason for Consultation: Establishing goals of care  HPI/Patient Profile: 87 y.o. female  with past medical history of CAD/NSTEMI, HTN/HLD, ischemic cardiomyopathy with previous EF of 35% improved to 42 on previous admit, hospitalized in May with CHF improved with diuresis admitted on 01/25/2021 with acute combined systolic/diastolic heart failure.   Clinical Assessment and Goals of Care: I have reviewed medical records including EPIC notes, labs and imaging, received report from RN, assessed the patient.  Julia Hart is lying quietly in bed.  She greets me making and mostly keeping eye contact.  She is alert and oriented, able to make her basic needs known.  We talked about her acute heart failure.  She tells me that she has been living independently, but her 2 nieces help care for her.    Call to niece, Carney Bern to discuss diagnosis prognosis, [place], EOL wishes, disposition and options.  I introduced Palliative Medicine as specialized medical care for people living with serious illness. It focuses on providing relief from the symptoms and stress of a serious illness. The goal is to improve quality of life for both the patient and the family.  Elmyra Ricks shares that Julia Hart is living independently in her own home.  She is not experiencing memory loss.  Her nephew is there at night and nieces Carney Bern and Hassan Rowan check on her multiple times during the day.  Elmyra Ricks shares that Hassan Rowan is mostly in charge of Julia Hart's medical needs, but she has been hospitalized and is "under the weather".    We talked about heart failure the signs and symptoms, the treatment plan.  Elmyra Ricks asks how long Julia Hart will be hospitalized.  I  share that Julia Hart is diuresing, her breathing is easier.  I share that is anticipated that she would stay at least a few days, when diuresing the elderly we "start low and go slow".  Elmyra Ricks states that the ultimate goal is for Julia Hart to return to her own home.  She shares that she has been to [facility] short-term rehab in [place] in the past and feels like she would be agreeable to go to rehab again.    Palliative Care services outpatient were explained and offered, but Elmyra Ricks states that she would like to speak with her at before accepting the services.  Discussed the importance of continued conversation with family and the medical providers regarding overall plan of care and treatment options, ensuring decisions are within the context of the patient's values and GOCs.  Questions and concerns were addressed.  The family was encouraged to call with questions or concerns.  PMT will continue to support holistically.  Conference with attending, bedside nursing staff, transition of care team related to patient condition, needs, goals of care.   HCPOA   NEXT OF KIN -Mrs. Kawahara tells me  that her niece, Hassan Rowan usually manages her healthcare, but has been in the hospital.  She shares that her niece, Carney Bern has also been helping take care of her.    SUMMARY OF RECOMMENDATIONS   Continue to treat the treatable but no CPR or intubation. Agreeable to short-term rehab, facility of choice would be [facility] in [place]. Considering outpatient palliative services   Code Status/Advance Care Planning: DNR  Symptom Management:  Per hospitalist, no additional needs at this time.  Palliative Prophylaxis:  Frequent Pain Assessment and Oral Care  Additional Recommendations (Limitations, Scope, Preferences): Continue to treat the treatable but no CPR or intubation  Psycho-social/Spiritual:  Desire for further Chaplaincy support:no Additional Recommendations: Caregiving  Support/Resources and  Education on Hospice  Prognosis:  Unable to determine, based on outcomes.  Guarded at this point.  6 months or less expected based on frailty, functional status, chronic illness burden, 4 hospital visits in the last 6 months  Discharge Planning:  Agreeable to short-term rehab, facility of choice is [facility] in [place].       Primary Diagnoses: Present on Admission:  Acute on chronic diastolic CHF (congestive heart failure) (HCC)  AKI (acute kidney injury) (HCC)  Atrial fibrillation, chronic (HCC)  CKD (chronic kidney disease) stage 3, GFR 30-59 ml/min ([place])   I have reviewed the medical record, interviewed the patient and family, and examined the patient. The following aspects are pertinent.  Past Medical History:  Diagnosis Date   CAD (coronary artery disease)    HTN (hypertension)    Hyperlipidemia    Social History   Socioeconomic History   Marital status: Single    Spouse name: Not on file   Number of children: Not on file   Years of education: Not on file   Highest education level: Not on file  Occupational History   Not on file  Tobacco Use   Smoking status: Former    Pack years: 0.00   Smokeless tobacco: Never  Vaping Use   Vaping Use: Never used  Substance and Sexual Activity   Alcohol use: No   Drug use: No   Sexual activity: Not on file  Other Topics Concern   Not on file  Social History Narrative   QUIT SMOKING LONG TIME AGO. WORKED [employer]. NO KIDS. NIECE HELPS CARE FOR HER.   Social Determinants of Health   Financial Resource Strain: Not on file  Food Insecurity: Not on file  Transportation Needs: Not on file  Physical Activity: Not on file  Stress: Not on file  Social Connections: Not on file   Family History  Problem Relation Age of Onset   Colon cancer Neg Hx    Colon polyps Neg Hx    Scheduled Meds:  apixaban  2.5 mg Oral BID   [START ON 01/27/2021] aspirin EC  81 mg Oral Q breakfast   atorvastatin  20 mg Oral QHS   feeding  supplement  237 mL Oral BID   furosemide  40 mg Intravenous BID   insulin aspart  0-5 Units Subcutaneous QHS   insulin aspart  0-9 Units Subcutaneous TID WC   metoprolol tartrate  25 mg Oral q morning   pantoprazole  40 mg Oral Daily   senna-docusate  2 tablet Oral QHS   sodium chloride flush  3 mL Intravenous Q12H   spironolactone  12.5 mg Oral Daily   Continuous Infusions:  sodium chloride     PRN Meds:.sodium chloride, acetaminophen, HYDROcodone-acetaminophen, nitroGLYCERIN, ondansetron (ZOFRAN) IV, polyethylene  glycol, sodium chloride flush Medications Prior to Admission:  Prior to Admission medications   Medication Sig Start Date End Date Taking? Authorizing Provider  acetaminophen (TYLENOL) 325 MG tablet Take 2 tablets (650 mg total) by mouth every 6 (six) hours as needed for mild pain, fever or headache (or Fever >/= 101). 09/18/20  Yes Emokpae, Courage, MD  apixaban (ELIQUIS) 2.5 MG TABS tablet Take 1 tablet (2.5 mg total) by mouth 2 (two) times daily. 09/18/20  Yes Roxan Hockey, MD  aspirin EC 81 MG tablet Take 1 tablet (81 mg total) by mouth daily with breakfast. 09/18/20 09/18/21 Yes Emokpae, Courage, MD  atorvastatin (LIPITOR) 20 MG tablet Take 20 mg by mouth at bedtime.    Yes [provider]  empagliflozin (JARDIANCE) 10 MG TABS tablet Take 10 mg by mouth every morning.   Yes [provider]  ENTRESTO 97-103 MG Take 0.5 tablets by mouth 2 (two) times daily. 09/19/20  Yes Emokpae, Courage, MD  feeding supplement (ENSURE ENLIVE / ENSURE PLUS) LIQD Take 237 mLs by mouth 2 (two) times daily. 09/18/20  Yes Roxan Hockey, MD  HYDROcodone-acetaminophen (NORCO/VICODIN) 5-325 MG tablet Take 1 tablet by mouth 3 (three) times daily as needed for severe pain. 12/25/20  Yes Carmin Muskrat, MD  metoprolol succinate (TOPROL-XL) 25 MG 24 hr tablet Take 1 tablet (25 mg total) by mouth daily. Patient taking differently: Take 25 mg by mouth every morning. 11/30/20  Yes Barton Dubois, MD  nitroGLYCERIN (NITROSTAT) 0.4 MG SL tablet Place 0.4 mg under the tongue every 5 (five) minutes as needed for chest pain.   Yes [provider]  pantoprazole (PROTONIX) 40 MG tablet Take 40 mg by mouth daily. 05/17/20  Yes [provider]  polyethylene glycol (MIRALAX / GLYCOLAX) 17 g packet Take 17 g by mouth daily as needed for mild constipation. 09/18/20  Yes Emokpae, Courage, MD  senna-docusate (SENOKOT-S) 8.6-50 MG tablet Take 2 tablets by mouth at bedtime. 09/18/20 09/18/21 Yes Emokpae, Courage, MD  spironolactone (ALDACTONE) 25 MG tablet Take 12.5 mg by mouth daily. 01/15/21  Yes [provider]  torsemide 40 MG TABS Take 40 mg by mouth daily. 11/29/20  Yes Barton Dubois, MD  triamcinolone cream (KENALOG) 0.1 % Apply 1 application topically 2 (two) times daily as needed (FOR RASH-ITCH (applied to back)). 12/14/20  Yes [provider]   No Known Allergies Review of Systems  Unable to perform ROS: Age   Physical Exam Vitals and nursing note reviewed.  Constitutional:      General: She is not in acute distress. HENT:     Head: Normocephalic and atraumatic.     Mouth/Throat:     Mouth: Mucous membranes are moist.  Cardiovascular:     Rate and Rhythm: Normal rate.  Pulmonary:     Effort: Pulmonary effort is normal. No tachypnea.  Abdominal:     Palpations: Abdomen is soft.  Skin:    General: Skin is warm and dry.  Neurological:     Mental Status: She is alert and oriented to person, place, and time.  Psychiatric:        Mood and Affect: Mood normal.        Behavior: Behavior normal.    Vital Signs: BP (!) 119/53 (BP Location: Right Arm)   Pulse 86   Temp 97.7 F (36.5 C) (Oral)   Resp 18   Ht 5\' 6"  (1.676 m)   Wt 60.7 kg   SpO2 100%   BMI 21.60  kg/m  Pain Scale: 0-10   Pain Score: 0-No pain   SpO2: SpO2: 100 % O2 Device:SpO2: 100 % O2 Flow Rate: .   IO: Intake/output summary:  Intake/Output Summary (Last 24 hours) at  01/26/2021 1410 Last data filed at 01/26/2021 1100 Gross per 24 hour  Intake 300 ml  Output 500 ml  Net -200 ml    LBM: Last BM Date: 01/25/21 (Simultaneous filing. User may not have seen previous data.) Baseline Weight: Weight: 54 kg Most recent weight: Weight: 60.7 kg     Palliative Assessment/Data:   Flowsheet Rows    Flowsheet Row Most Recent Value  Intake Tab   Referral Department Hospitalist  Unit at Time of Referral Cardiac/Telemetry Unit  Palliative Care Primary Diagnosis Cardiac  Date Notified 01/26/21  Palliative Care Type New Palliative care  Reason for referral Clarify Goals of Care  Date of Admission 01/25/21  Date first seen by Palliative Care 01/26/21  # of days Palliative referral response time 0 Day(s)  # of days IP prior to Palliative referral 1  Clinical Assessment   Palliative Performance Scale Score 30%  Pain Max last 24 hours Not able to report  Pain Min Last 24 hours Not able to report  Dyspnea Max Last 24 Hours Not able to report  Dyspnea Min Last 24 hours Not able to report  Psychosocial & Spiritual Assessment   Palliative Care Outcomes        Time In: 1120 Time Out: 1210 Time Total: 50 minutes Greater than 50%  of this time was spent counseling and coordinating care related to the above assessment and plan.  Signed by: Drue Novel, NP   Please contact Palliative Medicine Team phone at [phone] for questions and concerns.  For individual provider: See Shea Evans

## 2021-01-26 NOTE — Care Management Important Message (Signed)
Important Message  Patient Details  Name: Julia Hart MRN: [id number] Date of Birth: Apr 30, 1927   Medicare Important Message Given:  Yes     Tommy Medal 01/26/2021, 3:49 PM

## 2021-01-26 NOTE — Progress Notes (Signed)
PROGRESS NOTE   Julia Hart  FXT:[id number] DOB: Nov 03, 1926 DOA: 01/25/2021 PCP: [name], MD   Chief Complaint  Patient presents with   Shortness of Breath   Level of care: Telemetry  Brief Admission History:  87 y.o. female, with a history of coronary artery disease/NSTEMI, hypertension, hyperlipidemia, ischemic cardiomyopathy with previous EF 35%, improved to 55% during previous admissions, patient with recent hospitalization in May due to to congestive heart failure episode improved with diuresis.  Patient was brought in by her family due to increased shortness of breath, some abdominal pain, patient reports she has some chronic dyspnea, off and on for a while, but it did much worsened today, ports she has been compliant with her medications, including diuretics, he does report worsening lower extremity edema over the last 2 days, ports she does not check her weight, notes her dyspnea worsened with activity, she denies cough, fever, hemoptysis or chest pain, she does report some abdominal pain with diarrhea as well.  in ED NP significantly elevated> 4500, chest x-ray significant for heart failure picture, but no pulmonary edema, her creatinine has increased to 2 from baseline of 1.6, her lipase within normal limit, [facility] hospitalist consulted to admit.  Assessment & Plan:   Active Problems:   Atrial fibrillation, chronic (HCC)   AKI (acute kidney injury) ([place])   Diabetes mellitus type 2 in nonobese (HCC)   CKD (chronic kidney disease) stage 3, GFR 30-59 ml/min (HCC)   Acute on chronic diastolic CHF (congestive heart failure) (HCC)   Acute combined systolic/diastolic heart failure - pt presented symptomatic with dyspnea, LE edema and markedly elevated BNP.  She has been managed with IV lasix for diuresis.  Will monitor I/Os, electrolytes.  Palliative consultation for goals of care discussion requested.    Persistent atrial fibrillation - she remains rate controlled with  metoprolol and anticoagulated with apixaban.    CAD s/p CABG - stable, no CP symptoms, HS troponins do not favor ACS.  Continue statin, metoprolol and aspirin.   Essential hypertension - holding some home meds due to soft BPs.    AKI on stage 3b CKD - Monitoring with IV diuresis.    Hyperlipidemia - remains on atorvastatin 20 mg daily.    Type 2 DM with renal complications - continue SSI coverage and CBG monitoring.     DVT prophylaxis: apixaban  Code Status: DNR  Family Communication: t/c for palliative discussion Disposition: TBD  Status is: Inpatient  Remains inpatient appropriate because:IV treatments appropriate due to intensity of illness or inability to take PO and Inpatient level of care appropriate due to severity of illness  Dispo: The patient is from: Home              Anticipated d/c is to: Home              Patient currently is not medically stable to d/c.   Difficult to place patient No   Consultants:  Palliative medicine   Procedures:  N/a  Antimicrobials:  N/a   Subjective: Pt reports no SOB this  morning.  She is reporting some irritation in her nares.    Objective: Vitals:   01/25/21 1930 01/25/21 2017 01/25/21 2200 01/26/21 0742  BP: (!) 131/57 125/60 125/66 125/61  Pulse: 87 88 86 87  Resp: 17 19 18 18   Temp:  (!) 97.5 F (36.4 C) 98.2 F (36.8 C) (!) 97.5 F (36.4 C)  TempSrc:  Oral Oral Oral  SpO2: 100% 100% 99% 100%  Weight:  60.7 kg    Height:  5\' 6"  (1.676 m)      Intake/Output Summary (Last 24 hours) at 01/26/2021 1100 Last data filed at 01/25/2021 1950 Gross per 24 hour  Intake --  Output 500 ml  Net -500 ml   Filed Weights   01/25/21 1536 01/25/21 2017  Weight: 54 kg 60.7 kg    Examination:  General exam: elderly fraile female, awake,alert, oriented x 3,, Appears calm and comfortable  Respiratory system: no crackles heard. Respiratory effort normal. Cardiovascular system: normal S1 & S2 heard. No JVD, murmurs, rubs, gallops  or clicks. trace pedal edema. Gastrointestinal system: Abdomen is nondistended, soft and nontender. No organomegaly or masses felt. Normal bowel sounds heard. Central nervous system: Alert and oriented. No focal neurological deficits. Extremities: trace pretibial edema BLEs. Symmetric 5 x 5 power. Skin: No rashes, lesions or ulcers Psychiatry: Judgement and insight appear normal. Mood & affect appropriate.   Data Reviewed: I have personally reviewed following labs and imaging studies  CBC: Recent Labs  Lab 01/25/21 1447  WBC 3.5*  HGB 10.2*  HCT 32.4*  MCV 83.3  PLT 387    Basic Metabolic Panel: Recent Labs  Lab 01/25/21 1447 01/26/21 0534  NA 137 137  K 3.2* 3.3*  CL 103 101  CO2 25 25  GLUCOSE 122* 88  BUN 49* 53*  CREATININE 2.01* 2.08*  CALCIUM 8.5* 8.7*  MG 1.8  --     GFR: Estimated Creatinine Clearance: 16.2 mL/min (A) (by C-G formula based on SCr of 2.08 mg/dL (H)).  Liver Function Tests: Recent Labs  Lab 01/25/21 1447  AST 33  ALT 14  ALKPHOS 92  BILITOT 1.0  PROT 7.1  ALBUMIN 3.3*    CBG: Recent Labs  Lab 01/25/21 2158 01/26/21 0728  GLUCAP 90 92    Recent Results (from the past 240 hour(s))  Resp Panel by RT-PCR (Flu A&B, Covid) Nasopharyngeal Swab     Status: None   Collection Time: 01/25/21  4:21 PM   Specimen: Nasopharyngeal Swab; Nasopharyngeal(NP) swabs in vial transport medium  Result Value Ref Range Status   SARS Coronavirus 2 by RT PCR NEGATIVE NEGATIVE Final    Comment: (NOTE) SARS-CoV-2 target nucleic acids are NOT DETECTED.  The SARS-CoV-2 RNA is generally detectable in upper respiratory specimens during the acute phase of infection. The lowest concentration of SARS-CoV-2 viral copies this assay can detect is 138 copies/mL. A negative result does not preclude SARS-Cov-2 infection and should not be used as the sole basis for treatment or other patient management decisions. A negative result may occur with  improper  specimen collection/handling, submission of specimen other than nasopharyngeal swab, presence of viral mutation(s) within the areas targeted by this assay, and inadequate number of viral copies(<138 copies/mL). A negative result must be combined with clinical observations, patient history, and epidemiological information. The expected result is Negative.  Fact Sheet for Patients:  EntrepreneurPulse.com.au  Fact Sheet for Healthcare Providers:  IncredibleEmployment.be  This test is no t yet approved or cleared by the [place] FDA and  has been authorized for detection and/or diagnosis of SARS-CoV-2 by FDA under an Emergency Use Authorization (EUA). This EUA will remain  in effect (meaning this test can be used) for the duration of the COVID-19 declaration under Section 564(b)(1) of the Act, 21 U.S.C.section 360bbb-3(b)(1), unless the authorization is terminated  or revoked sooner.       Influenza A by PCR NEGATIVE NEGATIVE Final   Influenza  B by PCR NEGATIVE NEGATIVE Final    Comment: (NOTE) The Xpert Xpress SARS-CoV-2/FLU/RSV plus assay is intended as an aid in the diagnosis of influenza from Nasopharyngeal swab specimens and should not be used as a sole basis for treatment. Nasal washings and aspirates are unacceptable for Xpert Xpress SARS-CoV-2/FLU/RSV testing.  Fact Sheet for Patients: EntrepreneurPulse.com.au  Fact Sheet for Healthcare Providers: IncredibleEmployment.be  This test is not yet approved or cleared by the [place] FDA and has been authorized for detection and/or diagnosis of SARS-CoV-2 by FDA under an Emergency Use Authorization (EUA). This EUA will remain in effect (meaning this test can be used) for the duration of the COVID-19 declaration under Section 564(b)(1) of the Act, 21 U.S.C. section 360bbb-3(b)(1), unless the authorization is terminated or revoked.  Performed at  [facility], [address], [place], [place] [address]      Radiology Studies: DG Chest 1 View  Result Date: 01/25/2021 CLINICAL DATA:  Shortness of breath.  Left-sided abdominal pain. EXAM: CHEST  1 VIEW COMPARISON:  Chest x-ray 11/20/2020 FINDINGS: Enlarged cardiac silhouette. The heart size and mediastinal contours are unchanged. Aortic arch calcification. Right neck surgical changes overlie the mediastinum. No focal consolidation. Persistent coarsened interstitial markings with no overt pulmonary edema. Blunting of bilateral costophrenic angles with no definite pleural effusion. No pneumothorax. No acute osseous abnormality. IMPRESSION: No active disease in a patient with persistent enlarged cardiac silhouette. Electronically Signed   By: Iven Finn M.D.   On: 01/25/2021 15:40    Scheduled Meds:  apixaban  2.5 mg Oral BID   [START ON 01/27/2021] aspirin EC  81 mg Oral Q breakfast   atorvastatin  20 mg Oral QHS   feeding supplement  237 mL Oral BID   furosemide  40 mg Intravenous BID   insulin aspart  0-5 Units Subcutaneous QHS   insulin aspart  0-9 Units Subcutaneous TID WC   metoprolol tartrate  25 mg Oral q morning   pantoprazole  40 mg Oral Daily   senna-docusate  2 tablet Oral QHS   sodium chloride flush  3 mL Intravenous Q12H   spironolactone  12.5 mg Oral Daily   Continuous Infusions:  sodium chloride     magnesium sulfate bolus IVPB 2 g (01/26/21 1054)     LOS: 1 day   Time spent: 35 minutes   Jabe Jeanbaptiste Wynetta Emery, MD How to contact the [facility] Attending or Consulting provider [place] or covering provider during after hours [place], for this patient?  Check the care team in [facility] and look for a) attending/consulting [facility] provider listed and b) the [facility] team listed Log into [email or web address] and use 's universal password to access. If you do not have the password, please contact the hospital operator. Locate the [facility] provider you are looking for under [facility] and  page to a number that you can be directly reached. If you still have difficulty reaching the provider, please page the [facility] (Director on Call) for the Hospitalists listed on amion for assistance.  01/26/2021, 11:00 AM

## 2021-01-27 LAB — GLUCOSE, CAPILLARY
Glucose-Capillary: 86 mg/dL (ref 70–99)
Glucose-Capillary: 142 mg/dL — ABNORMAL HIGH (ref 70–99)
Glucose-Capillary: 77 mg/dL (ref 70–99)
Glucose-Capillary: 95 mg/dL (ref 70–99)

## 2021-01-27 LAB — BASIC METABOLIC PANEL
Anion gap: 9 (ref 5–15)
BUN: 54 mg/dL — ABNORMAL HIGH (ref 8–23)
CO2: 26 mmol/L (ref 22–32)
Calcium: 8.9 mg/dL (ref 8.9–10.3)
Chloride: 102 mmol/L (ref 98–111)
Creatinine, Ser: 1.94 mg/dL — ABNORMAL HIGH (ref 0.44–1.00)
GFR, Estimated: 24 mL/min — ABNORMAL LOW (ref >60)
Glucose, Bld: 99 mg/dL (ref 70–99)
Potassium: 4 mmol/L (ref 3.5–5.1)
Sodium: 137 mmol/L (ref 135–145)

## 2021-01-27 LAB — MAGNESIUM: Magnesium: 2.2 mg/dL (ref 1.7–2.4)

## 2021-01-27 MED ORDER — ACETAMINOPHEN 500 MG PO TABS
1000.0000 mg | ORAL_TABLET | Freq: Once | ORAL | Status: AC
  Administered 2021-01-27: 1000 mg via ORAL
  Filled 2021-01-27: qty 2

## 2021-01-27 MED ORDER — DICLOFENAC SODIUM 1 % EX GEL
2.0000 g | Freq: Two times a day (BID) | CUTANEOUS | Status: DC | PRN
  Administered 2021-01-27 – 2021-01-29 (×4): 2 g via TOPICAL
  Filled 2021-01-27 (×2): qty 100

## 2021-01-27 MED ORDER — POTASSIUM CHLORIDE 10 MEQ/100ML IV SOLN
10.0000 meq | Freq: Once | INTRAVENOUS | Status: AC
  Administered 2021-01-27: 10 meq via INTRAVENOUS
  Filled 2021-01-27: qty 100

## 2021-01-27 MED ORDER — GUAIFENESIN ER 600 MG PO TB12
600.0000 mg | ORAL_TABLET | Freq: Two times a day (BID) | ORAL | Status: DC
  Administered 2021-01-27 – 2021-01-30 (×7): 600 mg via ORAL
  Filled 2021-01-27 (×7): qty 1

## 2021-01-27 NOTE — Progress Notes (Signed)
PROGRESS NOTE   Julia Hart  HQI:[id number] DOB: 1927/06/22 DOA: 01/25/2021 PCP: [name], MD   Chief Complaint  Patient presents with   Shortness of Breath   Level of care: Med-Surg  Brief Admission History:  87 y.o. female, with a history of coronary artery disease/NSTEMI, hypertension, hyperlipidemia, ischemic cardiomyopathy with previous EF 35%, improved to 55% during previous admissions, patient with recent hospitalization in May due to to congestive heart failure episode improved with diuresis.  Patient was brought in by her family due to increased shortness of breath, some abdominal pain, patient reports she has some chronic dyspnea, off and on for a while, but it did much worsened today, ports she has been compliant with her medications, including diuretics, he does report worsening lower extremity edema over the last 2 days, ports she does not check her weight, notes her dyspnea worsened with activity, she denies cough, fever, hemoptysis or chest pain, she does report some abdominal pain with diarrhea as well.  in ED NP significantly elevated> 4500, chest x-ray significant for heart failure picture, but no pulmonary edema, her creatinine has increased to 2 from baseline of 1.6, her lipase within normal limit, [facility] hospitalist consulted to admit.  Assessment & Plan:   Active Problems:   Atrial fibrillation, chronic (HCC)   AKI (acute kidney injury) ([place])   Diabetes mellitus type 2 in nonobese (HCC)   CKD (chronic kidney disease) stage 3, GFR 30-59 ml/min (HCC)   Acute on chronic diastolic CHF (congestive heart failure) (HCC)   Acute combined systolic/diastolic heart failure -slowly improving, pt presented symptomatic with dyspnea, LE edema and markedly elevated BNP.  She has been managed with IV lasix for diuresis.  Will monitor I/Os, electrolytes.  Palliative consultation for goals of care discussion with goal to treat as able and plan for rehab and outpatient palliative  care.    Leg cramping - added additional IV potassium, voltaren gel and tylenol dose.   Persistent atrial fibrillation - she remains rate controlled with metoprolol and anticoagulated with apixaban.    CAD s/p CABG - stable, no CP symptoms, HS troponins do not favor ACS.  Continue statin, metoprolol and aspirin.   Essential hypertension - holding some home meds due to soft BPs.    AKI on stage 3b CKD - Monitoring with IV diuresis.    Hyperlipidemia - remains on atorvastatin 20 mg daily.    Type 2 DM with renal complications - continue SSI coverage and CBG monitoring.    Generalized weakness - planning for SNF rehab placement.  TOC team working on it.   DVT prophylaxis: apixaban (renal dose)  Code Status: DNR  Family Communication: no family  present at bedside today Disposition: TBD  Status is: Inpatient  Remains inpatient appropriate because:IV treatments appropriate due to intensity of illness or inability to take PO and Inpatient level of care appropriate due to severity of illness  Dispo: The patient is from: Home              Anticipated d/c is to: Home              Patient currently is not medically stable to d/c.   Difficult to place patient No   Consultants:  Palliative medicine   Procedures:  N/a  Antimicrobials:  N/a   Subjective: Pt still having some shortness of breath but overall has been improving and is diuresing on lasix injections.    Objective: Vitals:   01/26/21 1310 01/26/21 2052 01/27/21 0409 01/27/21  1353  BP: (!) 119/53 132/62 130/60 (!) 107/46  Pulse: 86 90 91 88  Resp: 18 18 18 16   Temp: 97.7 F (36.5 C) 97.6 F (36.4 C) 97.9 F (36.6 C) (!) 97.4 F (36.3 C)  TempSrc: Oral Oral Oral Oral  SpO2: 100% 100% 96% 100%  Weight:   61.8 kg   Height:        Intake/Output Summary (Last 24 hours) at 01/27/2021 1549 Last data filed at 01/27/2021 1200 Gross per 24 hour  Intake 603 ml  Output 850 ml  Net -247 ml   Filed Weights   01/25/21  1536 01/25/21 2017 01/27/21 0409  Weight: 54 kg 60.7 kg 61.8 kg    Examination:  General exam: elderly fraile female, awake,alert, oriented x 3,, Appears calm and comfortable  Respiratory system: no crackles heard. Respiratory effort normal. Cardiovascular system: normal S1 & S2 heard. No JVD, murmurs, rubs, gallops or clicks. trace pedal edema. Gastrointestinal system: Abdomen is nondistended, soft and nontender. No organomegaly or masses felt. Normal bowel sounds heard. Central nervous system: Alert and oriented. No focal neurological deficits. Extremities: trace pretibial edema BLEs. Symmetric 5 x 5 power. Skin: No rashes, lesions or ulcers Psychiatry: Judgement and insight appear normal. Mood & affect appropriate.   Data Reviewed: I have personally reviewed following labs and imaging studies  CBC: Recent Labs  Lab 01/25/21 1447  WBC 3.5*  HGB 10.2*  HCT 32.4*  MCV 83.3  PLT 536    Basic Metabolic Panel: Recent Labs  Lab 01/25/21 1447 01/26/21 0534 01/27/21 0439  NA 137 137 137  K 3.2* 3.3* 4.0  CL 103 101 102  CO2 25 25 26   GLUCOSE 122* 88 99  BUN 49* 53* 54*  CREATININE 2.01* 2.08* 1.94*  CALCIUM 8.5* 8.7* 8.9  MG 1.8  --  2.2    GFR: Estimated Creatinine Clearance: 17.3 mL/min (A) (by C-G formula based on SCr of 1.94 mg/dL (H)).  Liver Function Tests: Recent Labs  Lab 01/25/21 1447  AST 33  ALT 14  ALKPHOS 92  BILITOT 1.0  PROT 7.1  ALBUMIN 3.3*    CBG: Recent Labs  Lab 01/26/21 1103 01/26/21 1601 01/26/21 2201 01/27/21 0746 01/27/21 1204  GLUCAP 91 115* 156* 86 142*    Recent Results (from the past 240 hour(s))  Resp Panel by RT-PCR (Flu A&B, Covid) Nasopharyngeal Swab     Status: None   Collection Time: 01/25/21  4:21 PM   Specimen: Nasopharyngeal Swab; Nasopharyngeal(NP) swabs in vial transport medium  Result Value Ref Range Status   SARS Coronavirus 2 by RT PCR NEGATIVE NEGATIVE Final    Comment: (NOTE) SARS-CoV-2 target nucleic  acids are NOT DETECTED.  The SARS-CoV-2 RNA is generally detectable in upper respiratory specimens during the acute phase of infection. The lowest concentration of SARS-CoV-2 viral copies this assay can detect is 138 copies/mL. A negative result does not preclude SARS-Cov-2 infection and should not be used as the sole basis for treatment or other patient management decisions. A negative result may occur with  improper specimen collection/handling, submission of specimen other than nasopharyngeal swab, presence of viral mutation(s) within the areas targeted by this assay, and inadequate number of viral copies(<138 copies/mL). A negative result must be combined with clinical observations, patient history, and epidemiological information. The expected result is Negative.  Fact Sheet for Patients:  EntrepreneurPulse.com.au  Fact Sheet for Healthcare Providers:  IncredibleEmployment.be  This test is no t yet approved or cleared by the [place]  States FDA and  has been authorized for detection and/or diagnosis of SARS-CoV-2 by FDA under an Emergency Use Authorization (EUA). This EUA will remain  in effect (meaning this test can be used) for the duration of the COVID-19 declaration under Section 564(b)(1) of the Act, 21 U.S.C.section 360bbb-3(b)(1), unless the authorization is terminated  or revoked sooner.       Influenza A by PCR NEGATIVE NEGATIVE Final   Influenza B by PCR NEGATIVE NEGATIVE Final    Comment: (NOTE) The Xpert Xpress SARS-CoV-2/FLU/RSV plus assay is intended as an aid in the diagnosis of influenza from Nasopharyngeal swab specimens and should not be used as a sole basis for treatment. Nasal washings and aspirates are unacceptable for Xpert Xpress SARS-CoV-2/FLU/RSV testing.  Fact Sheet for Patients: EntrepreneurPulse.com.au  Fact Sheet for Healthcare Providers: IncredibleEmployment.be  This  test is not yet approved or cleared by the [place] FDA and has been authorized for detection and/or diagnosis of SARS-CoV-2 by FDA under an Emergency Use Authorization (EUA). This EUA will remain in effect (meaning this test can be used) for the duration of the COVID-19 declaration under Section 564(b)(1) of the Act, 21 U.S.C. section 360bbb-3(b)(1), unless the authorization is terminated or revoked.  Performed at [facility], [address], [place], [place] [address]      Radiology Studies: No results found.  Scheduled Meds:  acetaminophen  1,000 mg Oral Once   apixaban  2.5 mg Oral BID   aspirin EC  81 mg Oral Q breakfast   atorvastatin  20 mg Oral QHS   feeding supplement  237 mL Oral BID   furosemide  40 mg Intravenous BID   guaiFENesin  600 mg Oral BID   insulin aspart  0-5 Units Subcutaneous QHS   insulin aspart  0-9 Units Subcutaneous TID WC   metoprolol tartrate  25 mg Oral q morning   pantoprazole  40 mg Oral Daily   senna-docusate  2 tablet Oral QHS   sodium chloride flush  3 mL Intravenous Q12H   spironolactone  12.5 mg Oral Daily   Continuous Infusions:  sodium chloride     potassium chloride       LOS: 2 days   Time spent: 35 minutes   Sinan Tuch Wynetta Emery, MD How to contact the [facility] Attending or Consulting provider [place] or covering provider during after hours [place], for this patient?  Check the care team in [facility] and look for a) attending/consulting TRH provider listed and b) the [facility] team listed Log into [email or web address] and use [place]'s universal password to access. If you do not have the password, please contact the hospital operator. Locate the [facility] provider you are looking for under [facility] and page to a number that you can be directly reached. If you still have difficulty reaching the provider, please page the [facility] (Director on Call) for the Hospitalists listed on amion for assistance.  01/27/2021, 3:49 PM

## 2021-01-27 NOTE — NC FL2 (Signed)
[place] LEVEL OF CARE SCREENING TOOL     IDENTIFICATION  Patient Name: Julia Hart Birthdate: 28-May-1927 Sex: female Admission Date (Current Location): 01/25/2021  [facility] and [place] Number:  [employer] and Address:  [facility] [address] [place]      Provider Number: [id number]  Attending Physician Name and Address:  Murlean Iba, MD  Relative Name and Phone Number:  Carney Bern (Niece)   [phone]    Current Level of Care: Hospital Recommended Level of Care: [place] Prior Approval Number:    Date Approved/Denied:   PASRR Number:    Discharge Plan: SNF    Current Diagnoses: Patient Active Problem List   Diagnosis Date Noted   Acute on chronic diastolic CHF (congestive heart failure) ([place]) 01/25/2021   SOB (shortness of breath)    Acute on chronic combined systolic and diastolic CHF (congestive heart failure) ([place]) 11/22/2020   CKD (chronic kidney disease) stage 3, GFR 30-59 ml/min ([place]) 11/22/2020   Elevated troponin 11/21/2020   AKI (acute kidney injury) ([place]) 11/21/2020   Hyponatremia 11/21/2020   Mild protein-calorie malnutrition ([place]) 11/21/2020   Diabetes mellitus type 2 in nonobese ([place]) 11/21/2020   CHF (congestive heart failure) ([place]) 11/20/2020   Acute exacerbation of CHF (congestive heart failure) ([place]) 09/15/2020   Acute on chronic systolic CHF (congestive heart failure) ([place]) 08/04/2020   Decompensated heart failure ([place]) 08/03/2020   Coronary artery disease 08/03/2020   HTN (hypertension) 08/03/2020   COVID-19 virus infection 08/03/2020   Atrial fibrillation, chronic ([place]) 08/03/2020   GERD (gastroesophageal reflux disease) 07/20/2020   Melena 06/29/2020   Abdominal pain 06/29/2020   Loss of weight    Anemia 12/19/2016   Change in bowel habits 12/19/2016    Orientation RESPIRATION BLADDER Height & Weight     Self, Time, Situation, Place  Normal Continent  Weight: 136 lb 3.9 oz (61.8 kg) Height:  5\' 6"  (167.6 cm)  BEHAVIORAL SYMPTOMS/MOOD NEUROLOGICAL BOWEL NUTRITION STATUS      Continent Diet (Diet heart healthy/carb modified Room service appropriate? Yes; Fluid consistency: Thin)  AMBULATORY STATUS COMMUNICATION OF NEEDS Skin   Limited Assist Verbally Skin abrasions (Right arm)                       Personal Care Assistance Level of Assistance  Bathing, Feeding, Dressing Bathing Assistance: Limited assistance Feeding assistance: Independent Dressing Assistance: Limited assistance     Functional Limitations Info  Sight, Hearing, Speech Sight Info: Impaired Hearing Info: Impaired Speech Info: Adequate    SPECIAL CARE FACTORS FREQUENCY  PT (By licensed PT)     PT Frequency: 5x              Contractures Contractures Info: Not present    Additional Factors Info  Code Status, Allergies, Insulin Sliding Scale Code Status Info: Full Allergies Info: n/a   Insulin Sliding Scale Info: CBG < 70: Implement Hypoglycemia Standing Orders and refer to Hypoglycemia Standing Orders sidebar report   CBG 70 - 120: 0 units   CBG 121 - 150: 0 units   CBG 151 - 200: 0 units   CBG 201 - 250: 2 units   CBG 251 - 300: 3 units   CBG 301 - 350: 4 units   CBG 351 - 400: 5 units   CBG > 400 call MD and obtain STAT lab verification       Current Medications (01/27/2021):  This  is the current hospital active medication list Current Facility-Administered Medications  Medication Dose Route Frequency Provider Last Rate Last Admin   0.9 %  sodium chloride infusion  250 mL Intravenous PRN Elgergawy, Silver Huguenin, MD       acetaminophen (TYLENOL) tablet 650 mg  650 mg Oral Q4H PRN Elgergawy, Silver Huguenin, MD   650 mg at 01/25/21 2338   apixaban (ELIQUIS) tablet 2.5 mg  2.5 mg Oral BID Elgergawy, Silver Huguenin, MD   2.5 mg at 01/27/21 1505   aspirin EC tablet 81 mg  81 mg Oral Q breakfast Johnson, Clanford L, MD   81 mg at 01/27/21 6979   atorvastatin (LIPITOR)  tablet 20 mg  20 mg Oral QHS Elgergawy, Silver Huguenin, MD   20 mg at 01/26/21 2202   feeding supplement (ENSURE ENLIVE / ENSURE PLUS) liquid 237 mL  237 mL Oral BID Johnson, Clanford L, MD   237 mL at 01/27/21 4801   furosemide (LASIX) injection 40 mg  40 mg Intravenous BID Johnson, Clanford L, MD   40 mg at 01/27/21 0827   HYDROcodone-acetaminophen (NORCO/VICODIN) 5-325 MG per tablet 1 tablet  1 tablet Oral TID PRN Johnson, Clanford L, MD       insulin aspart (novoLOG) injection 0-5 Units  0-5 Units Subcutaneous QHS Elgergawy, Silver Huguenin, MD       insulin aspart (novoLOG) injection 0-9 Units  0-9 Units Subcutaneous TID WC Elgergawy, Silver Huguenin, MD       metoprolol tartrate (LOPRESSOR) tablet 25 mg  25 mg Oral q morning Elgergawy, Silver Huguenin, MD   25 mg at 01/27/21 0828   nitroGLYCERIN (NITROSTAT) SL tablet 0.4 mg  0.4 mg Sublingual Q5 min PRN Johnson, Clanford L, MD       ondansetron (ZOFRAN) injection 4 mg  4 mg Intravenous Q6H PRN Elgergawy, Silver Huguenin, MD   4 mg at 01/26/21 2202   pantoprazole (PROTONIX) EC tablet 40 mg  40 mg Oral Daily Elgergawy, Silver Huguenin, MD   40 mg at 01/27/21 0827   polyethylene glycol (MIRALAX / GLYCOLAX) packet 17 g  17 g Oral Daily PRN Johnson, Clanford L, MD       senna-docusate (Senokot-S) tablet 2 tablet  2 tablet Oral QHS Elgergawy, Silver Huguenin, MD   2 tablet at 01/26/21 2202   sodium chloride flush (NS) 0.9 % injection 3 mL  3 mL Intravenous Q12H Elgergawy, Silver Huguenin, MD   3 mL at 01/27/21 6553   sodium chloride flush (NS) 0.9 % injection 3 mL  3 mL Intravenous PRN Elgergawy, Silver Huguenin, MD       spironolactone (ALDACTONE) tablet 12.5 mg  12.5 mg Oral Daily Johnson, Clanford L, MD   12.5 mg at 01/27/21 7482     Discharge Medications: Please see discharge summary for a list of discharge medications.  Relevant Imaging Results:  Relevant Lab Results:   Additional Information Pt SSN: [id number]  Natasha Bence, LCSW

## 2021-01-27 NOTE — Progress Notes (Signed)
C/O left upper quadrant abd pain which states has been ongoing for a while.  No N&V and moving bowels.  Has had several ABD CT scans recently. Relayed this to [place]

## 2021-01-27 NOTE — Progress Notes (Signed)
Now C/O leg cramping.  Received order for one time Tylenol 1000, Voltaren and Potassium.

## 2021-01-28 ENCOUNTER — Inpatient Hospital Stay (HOSPITAL_COMMUNITY): Payer: Medicare Other

## 2021-01-28 LAB — BASIC METABOLIC PANEL
Anion gap: 7 (ref 5–15)
BUN: 54 mg/dL — ABNORMAL HIGH (ref 8–23)
CO2: 26 mmol/L (ref 22–32)
Calcium: 8.3 mg/dL — ABNORMAL LOW (ref 8.9–10.3)
Chloride: 102 mmol/L (ref 98–111)
Creatinine, Ser: 1.7 mg/dL — ABNORMAL HIGH (ref 0.44–1.00)
GFR, Estimated: 28 mL/min — ABNORMAL LOW (ref >60)
Glucose, Bld: 98 mg/dL (ref 70–99)
Potassium: 3.8 mmol/L (ref 3.5–5.1)
Sodium: 135 mmol/L (ref 135–145)

## 2021-01-28 LAB — GLUCOSE, CAPILLARY
Glucose-Capillary: 101 mg/dL — ABNORMAL HIGH (ref 70–99)
Glucose-Capillary: 147 mg/dL — ABNORMAL HIGH (ref 70–99)
Glucose-Capillary: 135 mg/dL — ABNORMAL HIGH (ref 70–99)
Glucose-Capillary: 93 mg/dL (ref 70–99)

## 2021-01-28 LAB — MAGNESIUM: Magnesium: 2 mg/dL (ref 1.7–2.4)

## 2021-01-28 MED ORDER — FUROSEMIDE 10 MG/ML IJ SOLN
40.0000 mg | Freq: Every day | INTRAMUSCULAR | Status: DC
  Administered 2021-01-29: 40 mg via INTRAVENOUS
  Filled 2021-01-28: qty 4

## 2021-01-28 MED ORDER — ACETAMINOPHEN 325 MG PO TABS
650.0000 mg | ORAL_TABLET | Freq: Four times a day (QID) | ORAL | Status: DC
  Administered 2021-01-28 – 2021-01-30 (×9): 650 mg via ORAL
  Filled 2021-01-28 (×9): qty 2

## 2021-01-28 MED ORDER — POTASSIUM CHLORIDE CRYS ER 10 MEQ PO TBCR
10.0000 meq | EXTENDED_RELEASE_TABLET | Freq: Every day | ORAL | Status: DC
  Administered 2021-01-28 – 2021-01-30 (×3): 10 meq via ORAL
  Filled 2021-01-28 (×3): qty 1

## 2021-01-28 MED ORDER — HYDROCORTISONE 1 % EX CREA
TOPICAL_CREAM | CUTANEOUS | Status: DC | PRN
  Filled 2021-01-28: qty 28

## 2021-01-28 NOTE — TOC Progression Note (Signed)
Transition of Care [facility]) - Progression Note    Patient Details  Name: Julia Hart MRN: [id number] Date of Birth: 1926-12-10  [facility] CM/SW Contact  Natasha Bence, LCSW Phone Number: 01/28/2021, 2:13 PM  Clinical Narrative:    CS[facility]. TOC to follow.        Expected Discharge Plan and Services                                                 Social Determinants of Health (SDOH) Interventions    Readmission Risk Interventions Readmission Risk Prevention Plan 11/22/2020  Transportation Screening Complete  HRI or [place] Complete  Social Work Consult for [place] Planning/Counseling Complete  Palliative Care Screening Not Applicable  Medication Review Press photographer) Complete  Some recent data might be hidden

## 2021-01-28 NOTE — Progress Notes (Signed)
PROGRESS NOTE   Julia Hart  FHQ:[id number] DOB: 1927/03/06 DOA: 01/25/2021 PCP: [name], MD   Chief Complaint  Patient presents with   Shortness of Breath   Level of care: Med-Surg  Brief Admission History:  87 y.o. female, with a history of coronary artery disease/NSTEMI, hypertension, hyperlipidemia, ischemic cardiomyopathy with previous EF 35%, improved to 55% during previous admissions, patient with recent hospitalization in May due to to congestive heart failure episode improved with diuresis.  Patient was brought in by her family due to increased shortness of breath, some abdominal pain, patient reports she has some chronic dyspnea, off and on for a while, but it did much worsened today, ports she has been compliant with her medications, including diuretics, he does report worsening lower extremity edema over the last 2 days, ports she does not check her weight, notes her dyspnea worsened with activity, she denies cough, fever, hemoptysis or chest pain, she does report some abdominal pain with diarrhea as well.  in ED NP significantly elevated> 4500, chest x-ray significant for heart failure picture, but no pulmonary edema, her creatinine has increased to 2 from baseline of 1.6, her lipase within normal limit, [facility] hospitalist consulted to admit.  Assessment & Plan:   Active Problems:   Atrial fibrillation, chronic (HCC)   AKI (acute kidney injury) ([place])   Diabetes mellitus type 2 in nonobese (HCC)   CKD (chronic kidney disease) stage 3, GFR 30-59 ml/min (HCC)   Acute on chronic diastolic CHF (congestive heart failure) (HCC)   Acute combined systolic/diastolic heart failure -slowly improving, pt presented symptomatic with dyspnea, LE edema and markedly elevated BNP.  She has been managed with IV lasix for diuresis.  Will monitor I/Os, electrolytes.  Palliative consultation for goals of care discussion with goal to treat as able and plan for rehab and outpatient palliative  care.    Leg cramping - Resolved now.  After IV potassium, voltaren gel and tylenol dose.   Persistent atrial fibrillation - she remains rate controlled with metoprolol and anticoagulated with apixaban.    CAD s/p CABG - stable, no CP symptoms, HS troponins do not favor ACS.  Continue statin, metoprolol and aspirin.   Facial pain - added scheduled tylenol.   Essential hypertension - holding some home meds due to soft BPs.    AKI on stage 3b CKD - Creatinine has been improving with IV diuresis.    Hyperlipidemia - remains on atorvastatin 20 mg daily.    Type 2 DM with renal complications - continue SSI coverage and CBG monitoring.    Generalized weakness - planning for SNF rehab placement.  TOC team working on it.   DVT prophylaxis: apixaban (renal dose)  Code Status: DNR  Family Communication: no family  present at bedside today Disposition: TBD  Status is: Inpatient  Remains inpatient appropriate because:IV treatments appropriate due to intensity of illness or inability to take PO and Inpatient level of care appropriate due to severity of illness  Dispo: The patient is from: Home              Anticipated d/c is to: Home              Patient currently is not medically stable to d/c.   Difficult to place patient No   Consultants:  Palliative medicine   Procedures:  N/a  Antimicrobials:  N/a   Subjective: Pt reports dyspnea.  No further leg cramping.    Objective: Vitals:   01/27/21 1353 01/27/21 2105  01/28/21 0449 01/28/21 0454  BP: (!) 107/46 (!) 117/58 (!) 117/56   Pulse: 88 90 92   Resp: 16 18 18    Temp: (!) 97.4 F (36.3 C) 97.7 F (36.5 C) 97.9 F (36.6 C)   TempSrc: Oral Oral Oral   SpO2: 100% 100% 99%   Weight:    62 kg  Height:        Intake/Output Summary (Last 24 hours) at 01/28/2021 1143 Last data filed at 01/28/2021 0900 Gross per 24 hour  Intake 1040.96 ml  Output --  Net 1040.96 ml   Filed Weights   01/25/21 2017 01/27/21 0409 01/28/21  0454  Weight: 60.7 kg 61.8 kg 62 kg    Examination:  General exam: elderly fraile female, awake,alert, oriented x 3,, Appears calm and comfortable  Respiratory system: no crackles heard. Respiratory effort normal. Cardiovascular system: normal S1 & S2 heard. No JVD, murmurs, rubs, gallops or clicks. trace pedal edema. Gastrointestinal system: Abdomen is nondistended, soft and nontender. No organomegaly or masses felt. Normal bowel sounds heard. Central nervous system: Alert and oriented. No focal neurological deficits. Extremities: no LE edema seen. Symmetric 5 x 5 power. Skin: No rashes, lesions or ulcers Psychiatry: Judgement and insight appear normal. Mood & affect appropriate.   Data Reviewed: I have personally reviewed following labs and imaging studies  CBC: Recent Labs  Lab 01/25/21 1447  WBC 3.5*  HGB 10.2*  HCT 32.4*  MCV 83.3  PLT 242    Basic Metabolic Panel: Recent Labs  Lab 01/25/21 1447 01/26/21 0534 01/27/21 0439 01/28/21 0429  NA 137 137 137 135  K 3.2* 3.3* 4.0 3.8  CL 103 101 102 102  CO2 25 25 26 26   GLUCOSE 122* 88 99 98  BUN 49* 53* 54* 54*  CREATININE 2.01* 2.08* 1.94* 1.70*  CALCIUM 8.5* 8.7* 8.9 8.3*  MG 1.8  --  2.2 2.0    GFR: Estimated Creatinine Clearance: 19.8 mL/min (A) (by C-G formula based on SCr of 1.7 mg/dL (H)).  Liver Function Tests: Recent Labs  Lab 01/25/21 1447  AST 33  ALT 14  ALKPHOS 92  BILITOT 1.0  PROT 7.1  ALBUMIN 3.3*    CBG: Recent Labs  Lab 01/27/21 1204 01/27/21 1705 01/27/21 2102 01/28/21 0724 01/28/21 1122  GLUCAP 142* 77 95 101* 147*    Recent Results (from the past 240 hour(s))  Resp Panel by RT-PCR (Flu A&B, Covid) Nasopharyngeal Swab     Status: None   Collection Time: 01/25/21  4:21 PM   Specimen: Nasopharyngeal Swab; Nasopharyngeal(NP) swabs in vial transport medium  Result Value Ref Range Status   SARS Coronavirus 2 by RT PCR NEGATIVE NEGATIVE Final    Comment: (NOTE) SARS-CoV-2  target nucleic acids are NOT DETECTED.  The SARS-CoV-2 RNA is generally detectable in upper respiratory specimens during the acute phase of infection. The lowest concentration of SARS-CoV-2 viral copies this assay can detect is 138 copies/mL. A negative result does not preclude SARS-Cov-2 infection and should not be used as the sole basis for treatment or other patient management decisions. A negative result may occur with  improper specimen collection/handling, submission of specimen other than nasopharyngeal swab, presence of viral mutation(s) within the areas targeted by this assay, and inadequate number of viral copies(<138 copies/mL). A negative result must be combined with clinical observations, patient history, and epidemiological information. The expected result is Negative.  Fact Sheet for Patients:  EntrepreneurPulse.com.au  Fact Sheet for Healthcare Providers:  IncredibleEmployment.be  This  test is no t yet approved or cleared by the [place] and  has been authorized for detection and/or diagnosis of SARS-CoV-2 by FDA under an Emergency Use Authorization (EUA). This EUA will remain  in effect (meaning this test can be used) for the duration of the COVID-19 declaration under Section 564(b)(1) of the Act, 21 U.S.C.section 360bbb-3(b)(1), unless the authorization is terminated  or revoked sooner.       Influenza A by PCR NEGATIVE NEGATIVE Final   Influenza B by PCR NEGATIVE NEGATIVE Final    Comment: (NOTE) The Xpert Xpress SARS-CoV-2/FLU/RSV plus assay is intended as an aid in the diagnosis of influenza from Nasopharyngeal swab specimens and should not be used as a sole basis for treatment. Nasal washings and aspirates are unacceptable for Xpert Xpress SARS-CoV-2/FLU/RSV testing.  Fact Sheet for Patients: EntrepreneurPulse.com.au  Fact Sheet for Healthcare  Providers: IncredibleEmployment.be  This test is not yet approved or cleared by the [place] FDA and has been authorized for detection and/or diagnosis of SARS-CoV-2 by FDA under an Emergency Use Authorization (EUA). This EUA will remain in effect (meaning this test can be used) for the duration of the COVID-19 declaration under Section 564(b)(1) of the Act, 21 U.S.C. section 360bbb-3(b)(1), unless the authorization is terminated or revoked.  Performed at [facility], [address], [place], [place] [address]      Radiology Studies: DG CHEST PORT 1 VIEW  Result Date: 01/28/2021 CLINICAL DATA:  Shortness of breath. EXAM: PORTABLE CHEST 1 VIEW COMPARISON:  Chest x-ray dated 01/25/2021 and 08/03/2020. FINDINGS: Stable cardiomegaly. Median sternotomy wires appear intact and stable in alignment. Coarse lung markings again noted bilaterally. Lungs otherwise clear. No confluent opacity to suggest superimposed pneumonia or pulmonary edema. No pleural effusion or pneumothorax is seen. No acute-appearing osseous abnormality. IMPRESSION: 1. No active disease. No evidence of pneumonia or pulmonary edema. 2. Stable cardiomegaly. 3. Chronic interstitial lung disease/fibrosis. Electronically Signed   By: Franki Cabot M.D.   On: 01/28/2021 10:22    Scheduled Meds:  acetaminophen  650 mg Oral Q6H   apixaban  2.5 mg Oral BID   aspirin EC  81 mg Oral Q breakfast   atorvastatin  20 mg Oral QHS   feeding supplement  237 mL Oral BID   [START ON 01/29/2021] furosemide  40 mg Intravenous Daily   guaiFENesin  600 mg Oral BID   insulin aspart  0-5 Units Subcutaneous QHS   insulin aspart  0-9 Units Subcutaneous TID WC   metoprolol tartrate  25 mg Oral q morning   pantoprazole  40 mg Oral Daily   senna-docusate  2 tablet Oral QHS   sodium chloride flush  3 mL Intravenous Q12H   spironolactone  12.5 mg Oral Daily   Continuous Infusions:  sodium chloride      LOS: 3 days   Time  spent: 35 minutes   Daryan Cagley Wynetta Emery, MD How to contact the [facility] Attending or Consulting provider [place] or covering provider during after hours [place], for this patient?  Check the care team in [facility] and look for a) attending/consulting [facility] provider listed and b) the [facility] team listed Log into [email or web address] and use [place]'s universal password to access. If you do not have the password, please contact the hospital operator. Locate the [facility] provider you are looking for under [facility] and page to a number that you can be directly reached. If you still have difficulty reaching the provider, please page the  DOC (Director on Call) for the Hospitalists listed on amion for assistance.  01/28/2021, 11:43 AM

## 2021-01-29 ENCOUNTER — Inpatient Hospital Stay (HOSPITAL_COMMUNITY): Payer: Medicare Other

## 2021-01-29 LAB — BASIC METABOLIC PANEL
Anion gap: 8 (ref 5–15)
BUN: 51 mg/dL — ABNORMAL HIGH (ref 8–23)
CO2: 25 mmol/L (ref 22–32)
Calcium: 8.4 mg/dL — ABNORMAL LOW (ref 8.9–10.3)
Chloride: 101 mmol/L (ref 98–111)
Creatinine, Ser: 1.64 mg/dL — ABNORMAL HIGH (ref 0.44–1.00)
GFR, Estimated: 29 mL/min — ABNORMAL LOW (ref >60)
Glucose, Bld: 84 mg/dL (ref 70–99)
Potassium: 4.1 mmol/L (ref 3.5–5.1)
Sodium: 134 mmol/L — ABNORMAL LOW (ref 135–145)

## 2021-01-29 LAB — GLUCOSE, CAPILLARY
Glucose-Capillary: 80 mg/dL (ref 70–99)
Glucose-Capillary: 130 mg/dL — ABNORMAL HIGH (ref 70–99)
Glucose-Capillary: 132 mg/dL — ABNORMAL HIGH (ref 70–99)
Glucose-Capillary: 126 mg/dL — ABNORMAL HIGH (ref 70–99)

## 2021-01-29 LAB — MAGNESIUM: Magnesium: 2.1 mg/dL (ref 1.7–2.4)

## 2021-01-29 MED ORDER — TORSEMIDE 20 MG PO TABS
40.0000 mg | ORAL_TABLET | Freq: Every day | ORAL | Status: DC
  Administered 2021-01-30: 40 mg via ORAL
  Filled 2021-01-29: qty 2

## 2021-01-29 NOTE — Progress Notes (Signed)
Physical Therapy Treatment Patient Details Name: Julia Hart MRN: [id number] DOB: [birth year] Today's Date: 01/29/2021    History of Present Illness 87 y.o. female, with a history of coronary artery disease/NSTEMI, hypertension, hyperlipidemia, ischemic cardiomyopathy with previous EF 35%, improved to 55% during previous admissions, patient with recent hospitalization in May due to to congestive heart failure episode improved with diuresis.  Patient was brought in by her family due to increased shortness of breath, some abdominal pain, patient reports she has some chronic dyspnea, off and on for a while, but it did much worsened today, ports she has been compliant with her medications, including diuretics, he does report worsening lower extremity edema over the last 2 days, ports she does not check her weight, notes her dyspnea worsened with activity, she denies cough, fever, hemoptysis or chest pain, she does report some abdominal pain with diarrhea as well.  in ED NP significantly elevated> 4500, chest x-ray significant for heart failure picture, but no pulmonary edema, her creatinine has increased to 2 from baseline of 1.6, her lipase within normal limit, [facility] hospitalist consulted to admit.    PT Comments    Patient sitting at edge of bed and complaining of being cold and abdominal pain at beginning of session. Patient agreeable to participating in therapy today. When offered, patient refused to ambulate with her personal SPC in room, stating she felt she needed to walk with the RW today. Patient performed well today but was obviously in abdominal discomfort during session. Patient exhibited dyspnea on exertion and required verbal and gestural cues for sequencing of steps and placement of hands during use of RW for sit to stand and stand pivot transfers. Patient agreeable to sitting in chair at end of session.  Patient would continue to benefit from skilled physical therapy in current environment  and next venue to continue return to prior function and increase strength, endurance, balance, coordination, and functional mobility and gait skills.    Follow Up Recommendations  Supervision/Assistance - 24 hour;Supervision for mobility/OOB;SNF     Equipment Recommendations  None recommended by PT    Recommendations for Other Services       Precautions / Restrictions Precautions Precautions: Fall Restrictions Weight Bearing Restrictions: No    Mobility  Bed Mobility               General bed mobility comments: patient sitting at EOB at beginning of session and in recliner at end of session    Transfers Overall transfer level: Needs assistance Equipment used: Rolling walker (2 wheeled) Transfers: Sit to/from [facility];Anterior-Posterior Transfer Sit to Stand: Min guard Stand pivot transfers: Engineer, building services transfers: Supervision   General transfer comment: cues for sequencing of steps and placement of hands during sit to stand and stand pivot transfers with RW  Ambulation/Gait Ambulation/Gait assistance: Min guard Gait Distance (Feet): 70 Feet Assistive device: Rolling walker (2 wheeled) Gait Pattern/deviations: Step-through pattern;Decreased step length - right;Decreased step length - left;Trunk flexed;Decreased stride length;Wide base of support Gait velocity: decreased   General Gait Details: somewhat slow labored gait with RW; limited by fatigue and stomach discomfort; on room air; patient complaining of shortness of breath/dyspnea on exertion   Stairs             Wheelchair Mobility    Modified Rankin (Stroke Patients Only)       Balance Overall balance assessment: Needs assistance Sitting-balance support: Feet supported Sitting balance-Leahy Scale: Good Sitting balance - Comments: seated edge of  bed   Standing balance support: Bilateral upper extremity supported Standing balance-Leahy Scale:  Fair Standing balance comment: fair/good with RW                            Cognition Arousal/Alertness: Awake/alert Behavior During Therapy: WFL for tasks assessed/performed Overall Cognitive Status: Within Functional Limits for tasks assessed                                        Exercises      General Comments        Pertinent Vitals/Pain Pain Assessment: Faces Pain Score: 5  Pain Location: abdomen Pain Descriptors / Indicators: Moaning;Aching;Stabbing;Cramping;Grimacing;Guarding;Other (Comment) (gas) Pain Intervention(s): Limited activity within patient's tolerance;Monitored during session;Repositioned    Home Living                      Prior Function            PT Goals (current goals can now be found in the care plan section) Acute Rehab PT Goals Patient Stated Goal: Return home PT Goal Formulation: With patient Time For Goal Achievement: 02/09/21 Potential to Achieve Goals: Good Progress towards PT goals: Progressing toward goals    Frequency    Min 3X/week      PT Plan Current plan remains appropriate       AM-PAC PT "6 Clicks" Mobility   Outcome Measure  Help needed turning from your back to your side while in a flat bed without using bedrails?: None Help needed moving from lying on your back to sitting on the side of a flat bed without using bedrails?: A Little Help needed moving to and from a bed to a chair (including a wheelchair)?: A Little Help needed standing up from a chair using your arms (e.g., wheelchair or bedside chair)?: A Little Help needed to walk in hospital room?: A Little Help needed climbing 3-5 steps with a railing? : A Lot 6 Click Score: 18    End of Session Equipment Utilized During Treatment: Gait belt Activity Tolerance: Patient tolerated treatment well;Patient limited by fatigue;Patient limited by pain Patient left: in chair;with call bell/phone within reach Nurse Communication:  Mobility status PT Visit Diagnosis: Unsteadiness on feet (R26.81);Other abnormalities of gait and mobility (R26.89);Muscle weakness (generalized) (M62.81)     Time: 1315-1330 PT Time Calculation (min) (ACUTE ONLY): 15 min  Charges:  $Therapeutic Activity: 8-22 mins                     Floria Raveling. Hartnett-Rands, MS, PT Per [place] [phone]  Pamala Hurry  Hartnett-Rands 01/29/2021, 1:38 PM

## 2021-01-29 NOTE — Progress Notes (Signed)
PROGRESS NOTE   Julia Hart  WPY:[id number] DOB: 11-17-1926 DOA: 01/25/2021 PCP: [name], MD   Chief Complaint  Patient presents with   Shortness of Breath   Level of care: Med-Surg  Brief Admission History:  87 y.o. female, with a history of coronary artery disease/NSTEMI, hypertension, hyperlipidemia, ischemic cardiomyopathy with previous EF 35%, improved to 55% during previous admissions, patient with recent hospitalization in May due to to congestive heart failure episode improved with diuresis.  Patient was brought in by her family due to increased shortness of breath, some abdominal pain, patient reports she has some chronic dyspnea, off and on for a while, but it did much worsened today, ports she has been compliant with her medications, including diuretics, he does report worsening lower extremity edema over the last 2 days, ports she does not check her weight, notes her dyspnea worsened with activity, she denies cough, fever, hemoptysis or chest pain, she does report some abdominal pain with diarrhea as well.  in ED NP significantly elevated> 4500, chest x-ray significant for heart failure picture, but no pulmonary edema, her creatinine has increased to 2 from baseline of 1.6, her lipase within normal limit, [facility] hospitalist consulted to admit.  Assessment & Plan:   Active Problems:   Atrial fibrillation, chronic (HCC)   AKI (acute kidney injury) ([place])   Diabetes mellitus type 2 in nonobese (HCC)   CKD (chronic kidney disease) stage 3, GFR 30-59 ml/min (HCC)   Acute on chronic diastolic CHF (congestive heart failure) (HCC)   Acute combined systolic/diastolic heart failure -slowly improving, pt presented symptomatic with dyspnea, LE edema and markedly elevated BNP.  She has been managed with IV lasix for diuresis.  DC IV lasix and start home oral torsemide 40 mg daily 7/12.  Will monitor I/Os, electrolytes.  Palliative consultation for goals of care discussion with goal to  treat as able and plan for rehab and outpatient palliative care.    Leg cramping - Resolved now.  After IV potassium, voltaren gel and tylenol dose.   Persistent atrial fibrillation - she remains rate controlled with metoprolol and anticoagulated with apixaban.    CAD s/p CABG - stable, no CP symptoms, HS troponins do not favor ACS.  Continue statin, metoprolol and aspirin.   Facial pain - added scheduled tylenol.   Essential hypertension - holding some home meds due to soft BPs.    AKI on stage 3b CKD - Creatinine has been improving with IV diuresis.    Hyperlipidemia - remains on atorvastatin 20 mg daily.    Type 2 DM with renal complications - continue SSI coverage and CBG monitoring.    Generalized weakness - planning for SNF rehab placement.  TOC team working on it.   DVT prophylaxis: apixaban (renal dose)  Code Status: DNR  Family Communication: no family  present at bedside today Disposition: TBD  Status is: Inpatient  Remains inpatient appropriate because:IV treatments appropriate due to intensity of illness or inability to take PO and Inpatient level of care appropriate due to severity of illness  Dispo: The patient is from: Home              Anticipated d/c is to: Home              Patient currently is not medically stable to d/c.   Difficult to place patient No   Consultants:  Palliative medicine   Procedures:  N/a  Antimicrobials:  N/a   Subjective: Pt reports generalized complaints but did  have a bowel movement.  She is diuresing well.      Objective: Vitals:   01/28/21 2115 01/29/21 0511 01/29/21 0514 01/29/21 0814  BP: (!) 115/55  136/60 131/63  Pulse: 91  92 93  Resp: 18  18   Temp: 98.1 F (36.7 C)  97.8 F (36.6 C)   TempSrc: Oral  Oral   SpO2: 100%  99%   Weight:  61.7 kg    Height:        Intake/Output Summary (Last 24 hours) at 01/29/2021 1212 Last data filed at 01/29/2021 0900 Gross per 24 hour  Intake 725 ml  Output --  Net 725 ml    Filed Weights   01/27/21 0409 01/28/21 0454 01/29/21 0511  Weight: 61.8 kg 62 kg 61.7 kg    Examination:  General exam: elderly fraile female, awake,alert, oriented x 3,, Appears calm and comfortable  Respiratory system: no crackles heard. Respiratory effort normal. Cardiovascular system: normal S1 & S2 heard. No JVD, murmurs, rubs, gallops or clicks. trace pedal edema. Gastrointestinal system: Abdomen is nondistended, soft and nontender. No organomegaly or masses felt. Normal bowel sounds heard. Central nervous system: Alert and oriented. No focal neurological deficits. Extremities: no LE edema seen. Symmetric 5 x 5 power. Skin: No rashes, lesions or ulcers Psychiatry: Judgement and insight appear normal. Mood & affect appropriate.   Data Reviewed: I have personally reviewed following labs and imaging studies  CBC: Recent Labs  Lab 01/25/21 1447  WBC 3.5*  HGB 10.2*  HCT 32.4*  MCV 83.3  PLT 229    Basic Metabolic Panel: Recent Labs  Lab 01/25/21 1447 01/26/21 0534 01/27/21 0439 01/28/21 0429 01/29/21 0611  NA 137 137 137 135 134*  K 3.2* 3.3* 4.0 3.8 4.1  CL 103 101 102 102 101  CO2 25 25 26 26 25   GLUCOSE 122* 88 99 98 84  BUN 49* 53* 54* 54* 51*  CREATININE 2.01* 2.08* 1.94* 1.70* 1.64*  CALCIUM 8.5* 8.7* 8.9 8.3* 8.4*  MG 1.8  --  2.2 2.0 2.1    GFR: Estimated Creatinine Clearance: 20.5 mL/min (A) (by C-G formula based on SCr of 1.64 mg/dL (H)).  Liver Function Tests: Recent Labs  Lab 01/25/21 1447  AST 33  ALT 14  ALKPHOS 92  BILITOT 1.0  PROT 7.1  ALBUMIN 3.3*    CBG: Recent Labs  Lab 01/28/21 1122 01/28/21 1622 01/28/21 2112 01/29/21 0726 01/29/21 1109  GLUCAP 147* 135* 93 80 130*    Recent Results (from the past 240 hour(s))  Resp Panel by RT-PCR (Flu A&B, Covid) Nasopharyngeal Swab     Status: None   Collection Time: 01/25/21  4:21 PM   Specimen: Nasopharyngeal Swab; Nasopharyngeal(NP) swabs in vial transport medium  Result  Value Ref Range Status   SARS Coronavirus 2 by RT PCR NEGATIVE NEGATIVE Final    Comment: (NOTE) SARS-CoV-2 target nucleic acids are NOT DETECTED.  The SARS-CoV-2 RNA is generally detectable in upper respiratory specimens during the acute phase of infection. The lowest concentration of SARS-CoV-2 viral copies this assay can detect is 138 copies/mL. A negative result does not preclude SARS-Cov-2 infection and should not be used as the sole basis for treatment or other patient management decisions. A negative result may occur with  improper specimen collection/handling, submission of specimen other than nasopharyngeal swab, presence of viral mutation(s) within the areas targeted by this assay, and inadequate number of viral copies(<138 copies/mL). A negative result must be combined with clinical observations,  patient history, and epidemiological information. The expected result is Negative.  Fact Sheet for Patients:  EntrepreneurPulse.com.au  Fact Sheet for Healthcare Providers:  IncredibleEmployment.be  This test is no t yet approved or cleared by the [place] FDA and  has been authorized for detection and/or diagnosis of SARS-CoV-2 by FDA under an Emergency Use Authorization (EUA). This EUA will remain  in effect (meaning this test can be used) for the duration of the COVID-19 declaration under Section 564(b)(1) of the Act, 21 U.S.C.section 360bbb-3(b)(1), unless the authorization is terminated  or revoked sooner.       Influenza A by PCR NEGATIVE NEGATIVE Final   Influenza B by PCR NEGATIVE NEGATIVE Final    Comment: (NOTE) The Xpert Xpress SARS-CoV-2/FLU/RSV plus assay is intended as an aid in the diagnosis of influenza from Nasopharyngeal swab specimens and should not be used as a sole basis for treatment. Nasal washings and aspirates are unacceptable for Xpert Xpress SARS-CoV-2/FLU/RSV testing.  Fact Sheet for  Patients: EntrepreneurPulse.com.au  Fact Sheet for Healthcare Providers: IncredibleEmployment.be  This test is not yet approved or cleared by the [place] FDA and has been authorized for detection and/or diagnosis of SARS-CoV-2 by FDA under an Emergency Use Authorization (EUA). This EUA will remain in effect (meaning this test can be used) for the duration of the COVID-19 declaration under Section 564(b)(1) of the Act, 21 U.S.C. section 360bbb-3(b)(1), unless the authorization is terminated or revoked.  Performed at [facility], [address], [place], [place] [address]      Radiology Studies: DG Abd 1 View  Result Date: 01/29/2021 CLINICAL DATA:  Abdominal pain and distension for 2 days EXAM: ABDOMEN - 1 VIEW COMPARISON:  None FINDINGS: Probable medication tablet projecting over stomach in LEFT upper quadrant. Nonobstructive bowel gas pattern. No bowel dilatation or bowel wall thickening. Bones demineralized with degenerative changes of the hip joints bilaterally. Scattered atherosclerotic calcifications including aorta. No definite urinary tract calcifications. IMPRESSION: Nonobstructive bowel gas pattern. Electronically Signed   By: Lavonia Dana M.D.   On: 01/29/2021 09:56   DG CHEST PORT 1 VIEW  Result Date: 01/28/2021 CLINICAL DATA:  Shortness of breath. EXAM: PORTABLE CHEST 1 VIEW COMPARISON:  Chest x-ray dated 01/25/2021 and 08/03/2020. FINDINGS: Stable cardiomegaly. Median sternotomy wires appear intact and stable in alignment. Coarse lung markings again noted bilaterally. Lungs otherwise clear. No confluent opacity to suggest superimposed pneumonia or pulmonary edema. No pleural effusion or pneumothorax is seen. No acute-appearing osseous abnormality. IMPRESSION: 1. No active disease. No evidence of pneumonia or pulmonary edema. 2. Stable cardiomegaly. 3. Chronic interstitial lung disease/fibrosis. Electronically Signed   By: Franki Cabot  M.D.   On: 01/28/2021 10:22    Scheduled Meds:  acetaminophen  650 mg Oral Q6H   apixaban  2.5 mg Oral BID   aspirin EC  81 mg Oral Q breakfast   atorvastatin  20 mg Oral QHS   feeding supplement  237 mL Oral BID   guaiFENesin  600 mg Oral BID   insulin aspart  0-5 Units Subcutaneous QHS   insulin aspart  0-9 Units Subcutaneous TID WC   metoprolol tartrate  25 mg Oral q morning   pantoprazole  40 mg Oral Daily   potassium chloride  10 mEq Oral Daily   senna-docusate  2 tablet Oral QHS   sodium chloride flush  3 mL Intravenous Q12H   spironolactone  12.5 mg Oral Daily   [START ON 01/30/2021] torsemide  40 mg Oral Daily  Continuous Infusions:  sodium chloride      LOS: 4 days   Time spent: 35 minutes   Alfred Harrel Wynetta Emery, MD How to contact the [facility] Attending or Consulting provider [place] or covering provider during after hours [place], for this patient?  Check the care team in [facility] and look for a) attending/consulting [facility] provider listed and b) the [facility] team listed Log into [email or web address] and use [place]'s universal password to access. If you do not have the password, please contact the hospital operator. Locate the [facility] provider you are looking for under [facility] and page to a number that you can be directly reached. If you still have difficulty reaching the provider, please page the [facility] (Director on Call) for the Hospitalists listed on amion for assistance.  01/29/2021, 12:12 PM

## 2021-01-30 LAB — RESP PANEL BY RT-PCR (FLU A&B, COVID) ARPGX2
Influenza A by PCR: NEGATIVE
Influenza B by PCR: NEGATIVE
SARS Coronavirus 2 by RT PCR: NEGATIVE

## 2021-01-30 LAB — BASIC METABOLIC PANEL
Anion gap: 9 (ref 5–15)
BUN: 57 mg/dL — ABNORMAL HIGH (ref 8–23)
CO2: 25 mmol/L (ref 22–32)
Calcium: 8.4 mg/dL — ABNORMAL LOW (ref 8.9–10.3)
Chloride: 99 mmol/L (ref 98–111)
Creatinine, Ser: 1.57 mg/dL — ABNORMAL HIGH (ref 0.44–1.00)
GFR, Estimated: 31 mL/min — ABNORMAL LOW (ref >60)
Glucose, Bld: 96 mg/dL (ref 70–99)
Potassium: 4.1 mmol/L (ref 3.5–5.1)
Sodium: 133 mmol/L — ABNORMAL LOW (ref 135–145)

## 2021-01-30 LAB — GLUCOSE, CAPILLARY
Glucose-Capillary: 79 mg/dL (ref 70–99)
Glucose-Capillary: 141 mg/dL — ABNORMAL HIGH (ref 70–99)

## 2021-01-30 MED ORDER — GUAIFENESIN ER 600 MG PO TB12: 600.0000 mg | ORAL_TABLET | Freq: Two times a day (BID) | ORAL | Status: AC | PRN

## 2021-01-30 MED ORDER — ACETAMINOPHEN 325 MG PO TABS: 650.0000 mg | ORAL_TABLET | Freq: Three times a day (TID) | ORAL | 1 refills | Status: AC

## 2021-01-30 MED ORDER — DICLOFENAC SODIUM 1 % EX GEL: 2.0000 g | Freq: Two times a day (BID) | CUTANEOUS | Status: AC | PRN

## 2021-01-30 MED ORDER — METOPROLOL SUCCINATE ER 25 MG PO TB24: 25.0000 mg | ORAL_TABLET | Freq: Every morning | ORAL | Status: AC

## 2021-01-30 MED ORDER — HYDROCODONE-ACETAMINOPHEN 5-325 MG PO TABS: 1.0000 | ORAL_TABLET | Freq: Three times a day (TID) | ORAL | 0 refills | Status: AC | PRN

## 2021-01-30 NOTE — Discharge Summary (Addendum)
Physician Discharge Summary  Julia Hart JAS:[id number] DOB: [birth year] DOA: 01/25/2021  PCP: [name], MD  Admit date: 01/25/2021 Discharge date: 01/30/2021  Admitted From:  HOME  Disposition:  SNF   Recommendations for Outpatient Follow-up:  Follow up with PCP in 2 weeks Establish care with cardiology Outpatient palliative care for ongoing goals of care discussion Please obtain BMP in 1 week to check potassium level   Discharge Condition: STABLE   CODE STATUS: FULL DIET: heart healthy low sodium recommended    Brief Hospitalization Summary: Please see all hospital notes, images, labs for full details of the hospitalization. Brief Admission History:  87 y.o. female, with a history of coronary artery disease/NSTEMI, hypertension, hyperlipidemia, ischemic cardiomyopathy with previous EF 35%, improved to 55% during previous admissions, patient with recent hospitalization in May due to to congestive heart failure episode improved with diuresis.  Patient was brought in by her family due to increased shortness of breath, some abdominal pain, patient reports she has some chronic dyspnea, off and on for a while, but it did much worsened today, ports she has been compliant with her medications, including diuretics, he does report worsening lower extremity edema over the last 2 days, ports she does not check her weight, notes her dyspnea worsened with activity, she denies cough, fever, hemoptysis or chest pain, she does report some abdominal pain with diarrhea as well.  in ED NP significantly elevated> 4500, chest x-ray significant for heart failure picture, but no pulmonary edema, her creatinine has increased to 2 from baseline of 1.6, her lipase within normal limit, [facility]ist consulted to admit.   Assessment & Plan:   Active Problems:   Atrial fibrillation, chronic (HCC)   AKI (acute kidney injury) ([place])   Diabetes mellitus type 2 in nonobese (HCC)   CKD (chronic kidney disease)  stage 3, GFR 30-59 ml/min (HCC)   Acute on chronic diastolic CHF (congestive heart failure) (HCC)     Acute combined systolic/diastolic heart failure -slowly improving, pt presented symptomatic with dyspnea, LE edema and markedly elevated BNP.  She has been managed with IV lasix for diuresis.  DC IV lasix and start home oral torsemide 40 mg daily 7/12.  Will monitor I/Os, electrolytes.  Palliative consultation for goals of care discussion with goal to treat as able and plan for rehab and outpatient palliative care.  Pt is feeling much better.  Stable to DC to SNF today.  DID NOT RESTART ENTRESTO DUE TO CONSISTENT SOFT BLOOD PRESSURES. Outpatient follow up with cardiology for re-assessment and consideration for restarting.    Leg cramping - Resolved now.  After IV potassium, voltaren gel and tylenol dose.   Persistent atrial fibrillation - she remains rate controlled with metoprolol and anticoagulated with apixaban.     CAD s/p CABG - stable, no CP symptoms, HS troponins do not favor ACS.  Continue statin, metoprolol and aspirin.   Facial pain - resolved after scheduled tylenol.   Essential hypertension - holding some home meds due to soft BPs.     AKI on stage 3b CKD - Creatinine has been improving with IV diuresis.     Hyperlipidemia - remains on atorvastatin 20 mg daily.     Type 2 DM with renal complications - continue SSI coverage and CBG monitoring.     Generalized weakness - planning for SNF rehab placement.  TOC team working on it.   DVT prophylaxis: apixaban (renal dose) Code Status: DNR Family Communication: Niece at bedside updated 7/12 Disposition: SNF rehab  placement  Status is: Inpatient Discharge Diagnoses:  Active Problems:   Atrial fibrillation, chronic (HCC)   AKI (acute kidney injury) ([place])   Diabetes mellitus type 2 in nonobese (HCC)   CKD (chronic kidney disease) stage 3, GFR 30-59 ml/min (HCC)   Acute on chronic diastolic CHF (congestive heart failure)  [facility])   Discharge Instructions: Discharge Instructions     Amb Referral to Palliative Care   Complete by: As directed    Ambulatory referral to Cardiology   Complete by: As directed       Allergies as of 01/30/2021   No Known Allergies      Medication List     STOP taking these medications    Entresto 97-103 MG Generic drug: sacubitril-valsartan       TAKE these medications    acetaminophen 325 MG tablet Commonly known as: TYLENOL Take 2 tablets (650 mg total) by mouth in the morning, at noon, and at bedtime. What changed:  when to take this reasons to take this   apixaban 2.5 MG Tabs tablet Commonly known as: ELIQUIS Take 1 tablet (2.5 mg total) by mouth 2 (two) times daily.   aspirin EC 81 MG tablet Take 1 tablet (81 mg total) by mouth daily with breakfast.   atorvastatin 20 MG tablet Commonly known as: LIPITOR Take 20 mg by mouth at bedtime.   diclofenac Sodium 1 % Gel Commonly known as: VOLTAREN Apply 2 g topically 2 (two) times daily as needed (cramping in legs).   empagliflozin 10 MG Tabs tablet Commonly known as: JARDIANCE Take 10 mg by mouth every morning.   feeding supplement Liqd Take 237 mLs by mouth 2 (two) times daily.   guaiFENesin 600 MG 12 hr tablet Commonly known as: MUCINEX Take 1 tablet (600 mg total) by mouth 2 (two) times daily as needed for up to 5 days for cough or to loosen phlegm.   HYDROcodone-acetaminophen 5-325 MG tablet Commonly known as: NORCO/VICODIN Take 1 tablet by mouth 3 (three) times daily as needed for severe pain.   metoprolol succinate 25 MG 24 hr tablet Commonly known as: TOPROL-XL Take 1 tablet (25 mg total) by mouth every morning.   nitroGLYCERIN 0.4 MG SL tablet Commonly known as: NITROSTAT Place 0.4 mg under the tongue every 5 (five) minutes as needed for chest pain.   pantoprazole 40 MG tablet Commonly known as: PROTONIX Take 40 mg by mouth daily.   polyethylene glycol 17 g packet Commonly  known as: MIRALAX / GLYCOLAX Take 17 g by mouth daily as needed for mild constipation.   senna-docusate 8.6-50 MG tablet Commonly known as: Senokot-S Take 2 tablets by mouth at bedtime.   spironolactone 25 MG tablet Commonly known as: ALDACTONE Take 12.5 mg by mouth daily.   Torsemide 40 MG Tabs Take 40 mg by mouth daily.   triamcinolone cream 0.1 % Commonly known as: KENALOG Apply 1 application topically 2 (two) times daily as needed (FOR RASH-ITCH (applied to back)).        Follow-up Information     [name], MD Follow up in 2 week(s).   Specialty: Internal Medicine Why: Hospital Follow Up Contact information: [place] [address] [phone]                No Known Allergies Allergies as of 01/30/2021   No Known Allergies      Medication List     STOP taking these medications    Entresto 97-103 MG  Generic drug: sacubitril-valsartan       TAKE these medications    acetaminophen 325 MG tablet Commonly known as: TYLENOL Take 2 tablets (650 mg total) by mouth in the morning, at noon, and at bedtime. What changed:  when to take this reasons to take this   apixaban 2.5 MG Tabs tablet Commonly known as: ELIQUIS Take 1 tablet (2.5 mg total) by mouth 2 (two) times daily.   aspirin EC 81 MG tablet Take 1 tablet (81 mg total) by mouth daily with breakfast.   atorvastatin 20 MG tablet Commonly known as: LIPITOR Take 20 mg by mouth at bedtime.   diclofenac Sodium 1 % Gel Commonly known as: VOLTAREN Apply 2 g topically 2 (two) times daily as needed (cramping in legs).   empagliflozin 10 MG Tabs tablet Commonly known as: JARDIANCE Take 10 mg by mouth every morning.   feeding supplement Liqd Take 237 mLs by mouth 2 (two) times daily.   guaiFENesin 600 MG 12 hr tablet Commonly known as: MUCINEX Take 1 tablet (600 mg total) by mouth 2 (two) times daily as needed for up to 5 days for cough or to loosen phlegm.    HYDROcodone-acetaminophen 5-325 MG tablet Commonly known as: NORCO/VICODIN Take 1 tablet by mouth 3 (three) times daily as needed for severe pain.   metoprolol succinate 25 MG 24 hr tablet Commonly known as: TOPROL-XL Take 1 tablet (25 mg total) by mouth every morning.   nitroGLYCERIN 0.4 MG SL tablet Commonly known as: NITROSTAT Place 0.4 mg under the tongue every 5 (five) minutes as needed for chest pain.   pantoprazole 40 MG tablet Commonly known as: PROTONIX Take 40 mg by mouth daily.   polyethylene glycol 17 g packet Commonly known as: MIRALAX / GLYCOLAX Take 17 g by mouth daily as needed for mild constipation.   senna-docusate 8.6-50 MG tablet Commonly known as: Senokot-S Take 2 tablets by mouth at bedtime.   spironolactone 25 MG tablet Commonly known as: ALDACTONE Take 12.5 mg by mouth daily.   Torsemide 40 MG Tabs Take 40 mg by mouth daily.   triamcinolone cream 0.1 % Commonly known as: KENALOG Apply 1 application topically 2 (two) times daily as needed (FOR RASH-ITCH (applied to back)).        Procedures/Studies: DG Chest 1 View  Result Date: 01/25/2021 CLINICAL DATA:  Shortness of breath.  Left-sided abdominal pain. EXAM: CHEST  1 VIEW COMPARISON:  Chest x-ray 11/20/2020 FINDINGS: Enlarged cardiac silhouette. The heart size and mediastinal contours are unchanged. Aortic arch calcification. Right neck surgical changes overlie the mediastinum. No focal consolidation. Persistent coarsened interstitial markings with no overt pulmonary edema. Blunting of bilateral costophrenic angles with no definite pleural effusion. No pneumothorax. No acute osseous abnormality. IMPRESSION: No active disease in a patient with persistent enlarged cardiac silhouette. Electronically Signed   By: Iven Finn M.D.   On: 01/25/2021 15:40   DG Abd 1 View  Result Date: 01/29/2021 CLINICAL DATA:  Abdominal pain and distension for 2 days EXAM: ABDOMEN - 1 VIEW COMPARISON:  None  FINDINGS: Probable medication tablet projecting over stomach in LEFT upper quadrant. Nonobstructive bowel gas pattern. No bowel dilatation or bowel wall thickening. Bones demineralized with degenerative changes of the hip joints bilaterally. Scattered atherosclerotic calcifications including aorta. No definite urinary tract calcifications. IMPRESSION: Nonobstructive bowel gas pattern. Electronically Signed   By: Lavonia Dana M.D.   On: 01/29/2021 09:56   DG CHEST PORT 1 VIEW  Result Date: 01/28/2021 CLINICAL DATA:  Shortness of breath. EXAM: PORTABLE CHEST 1 VIEW COMPARISON:  Chest x-ray dated 01/25/2021 and 08/03/2020. FINDINGS: Stable cardiomegaly. Median sternotomy wires appear intact and stable in alignment. Coarse lung markings again noted bilaterally. Lungs otherwise clear. No confluent opacity to suggest superimposed pneumonia or pulmonary edema. No pleural effusion or pneumothorax is seen. No acute-appearing osseous abnormality. IMPRESSION: 1. No active disease. No evidence of pneumonia or pulmonary edema. 2. Stable cardiomegaly. 3. Chronic interstitial lung disease/fibrosis. Electronically Signed   By: Franki Cabot M.D.   On: 01/28/2021 10:22     Subjective: Pt overall was feeling much better.  She says no further leg cramping, she is urinating well on oral torsemide now.  She is ambulating.    Discharge Exam: Vitals:   01/29/21 2053 01/30/21 0520  BP: (!) 124/53 (!) 118/53  Pulse: 91 93  Resp: 18 18  Temp: 97.9 F (36.6 C) 98.5 F (36.9 C)  SpO2: 100% 100%   Vitals:   01/29/21 1349 01/29/21 2053 01/30/21 0427 01/30/21 0520  BP: 130/61 (!) 124/53  (!) 118/53  Pulse: 90 91  93  Resp: 18 18  18   Temp: 98 F (36.7 C) 97.9 F (36.6 C)  98.5 F (36.9 C)  TempSrc: Oral Oral  Oral  SpO2: 98% 100%  100%  Weight:   64.3 kg   Height:        General: Pt is alert, awake, not in acute distress Cardiovascular: normal S1/S2 +, no rubs, no gallops Respiratory: CTA bilaterally, no  wheezing, no rhonchi Abdominal: Soft, NT, ND, bowel sounds + Extremities: no edema, no cyanosis   The results of significant diagnostics from this hospitalization (including imaging, microbiology, ancillary and laboratory) are listed below for reference.     Microbiology: Recent Results (from the past 240 hour(s))  Resp Panel by RT-PCR (Flu A&B, Covid) Nasopharyngeal Swab     Status: None   Collection Time: 01/25/21  4:21 PM   Specimen: Nasopharyngeal Swab; Nasopharyngeal(NP) swabs in vial transport medium  Result Value Ref Range Status   SARS Coronavirus 2 by RT PCR NEGATIVE NEGATIVE Final    Comment: (NOTE) SARS-CoV-2 target nucleic acids are NOT DETECTED.  The SARS-CoV-2 RNA is generally detectable in upper respiratory specimens during the acute phase of infection. The lowest concentration of SARS-CoV-2 viral copies this assay can detect is 138 copies/mL. A negative result does not preclude SARS-Cov-2 infection and should not be used as the sole basis for treatment or other patient management decisions. A negative result may occur with  improper specimen collection/handling, submission of specimen other than nasopharyngeal swab, presence of viral mutation(s) within the areas targeted by this assay, and inadequate number of viral copies(<138 copies/mL). A negative result must be combined with clinical observations, patient history, and epidemiological information. The expected result is Negative.  Fact Sheet for Patients:  EntrepreneurPulse.com.au  Fact Sheet for Healthcare Providers:  IncredibleEmployment.be  This test is no t yet approved or cleared by the [place] FDA and  has been authorized for detection and/or diagnosis of SARS-CoV-2 by FDA under an Emergency Use Authorization (EUA). This EUA will remain  in effect (meaning this test can be used) for the duration of the COVID-19 declaration under Section 564(b)(1) of the Act,  21 U.S.C.section 360bbb-3(b)(1), unless the authorization is terminated  or revoked sooner.       Influenza A by PCR NEGATIVE NEGATIVE Final   Influenza B by PCR NEGATIVE NEGATIVE Final    Comment: (NOTE) The Xpert Xpress SARS-CoV-2/FLU/RSV  plus assay is intended as an aid in the diagnosis of influenza from Nasopharyngeal swab specimens and should not be used as a sole basis for treatment. Nasal washings and aspirates are unacceptable for Xpert Xpress SARS-CoV-2/FLU/RSV testing.  Fact Sheet for Patients: EntrepreneurPulse.com.au  Fact Sheet for Healthcare Providers: IncredibleEmployment.be  This test is not yet approved or cleared by the [place] FDA and has been authorized for detection and/or diagnosis of SARS-CoV-2 by FDA under an Emergency Use Authorization (EUA). This EUA will remain in effect (meaning this test can be used) for the duration of the COVID-19 declaration under Section 564(b)(1) of the Act, 21 U.S.C. section 360bbb-3(b)(1), unless the authorization is terminated or revoked.  Performed at [facility], [address], [place], [place] [address]      Labs: BNP (last 3 results) Recent Labs    11/23/20 0548 12/25/20 1840 01/25/21 1447  BNP >4,500.0* >4,500.0* >4,481.8*   Basic Metabolic Panel: Recent Labs  Lab 01/25/21 1447 01/26/21 0534 01/27/21 0439 01/28/21 0429 01/29/21 0611 01/30/21 0334  NA 137 137 137 135 134* 133*  K 3.2* 3.3* 4.0 3.8 4.1 4.1  CL 103 101 102 102 101 99  CO2 25 25 26 26 25 25   GLUCOSE 122* 88 99 98 84 96  BUN 49* 53* 54* 54* 51* 57*  CREATININE 2.01* 2.08* 1.94* 1.70* 1.64* 1.57*  CALCIUM 8.5* 8.7* 8.9 8.3* 8.4* 8.4*  MG 1.8  --  2.2 2.0 2.1  --    Liver Function Tests: Recent Labs  Lab 01/25/21 1447  AST 33  ALT 14  ALKPHOS 92  BILITOT 1.0  PROT 7.1  ALBUMIN 3.3*   Recent Labs  Lab 01/25/21 1447  LIPASE 50   No results for input(s): AMMONIA in the last 168  hours. CBC: Recent Labs  Lab 01/25/21 1447  WBC 3.5*  HGB 10.2*  HCT 32.4*  MCV 83.3  PLT 190   Cardiac Enzymes: No results for input(s): CKTOTAL, CKMB, CKMBINDEX, TROPONINI in the last 168 hours. BNP: Invalid input(s): POCBNP CBG: Recent Labs  Lab 01/29/21 1109 01/29/21 1609 01/29/21 2051 01/30/21 0730 01/30/21 1117  GLUCAP 130* 132* 126* 79 141*   D-Dimer No results for input(s): DDIMER in the last 72 hours. Hgb A1c No results for input(s): HGBA1C in the last 72 hours. Lipid Profile No results for input(s): CHOL, HDL, LDLCALC, TRIG, CHOLHDL, LDLDIRECT in the last 72 hours. Thyroid function studies No results for input(s): TSH, T4TOTAL, T3FREE, THYROIDAB in the last 72 hours.  Invalid input(s): FREET3 Anemia work up No results for input(s): VITAMINB12, FOLATE, FERRITIN, TIBC, IRON, RETICCTPCT in the last 72 hours. Urinalysis    Component Value Date/Time   COLORURINE YELLOW 01/26/2021 2220   APPEARANCEUR CLEAR 01/26/2021 2220   LABSPEC 1.009 01/26/2021 2220   PHURINE 5.0 01/26/2021 2220   GLUCOSEU 50 (A) 01/26/2021 2220   HGBUR MODERATE (A) 01/26/2021 2220   BILIRUBINUR NEGATIVE 01/26/2021 2220   KETONESUR NEGATIVE 01/26/2021 2220   PROTEINUR 30 (A) 01/26/2021 2220   NITRITE NEGATIVE 01/26/2021 2220   LEUKOCYTESUR TRACE (A) 01/26/2021 2220   Sepsis Labs Invalid input(s): PROCALCITONIN,  WBC,  LACTICIDVEN Microbiology Recent Results (from the past 240 hour(s))  Resp Panel by RT-PCR (Flu A&B, Covid) Nasopharyngeal Swab     Status: None   Collection Time: 01/25/21  4:21 PM   Specimen: Nasopharyngeal Swab; Nasopharyngeal(NP) swabs in vial transport medium  Result Value Ref Range Status   SARS Coronavirus 2 by RT PCR NEGATIVE NEGATIVE Final  Comment: (NOTE) SARS-CoV-2 target nucleic acids are NOT DETECTED.  The SARS-CoV-2 RNA is generally detectable in upper respiratory specimens during the acute phase of infection. The lowest concentration of SARS-CoV-2  viral copies this assay can detect is 138 copies/mL. A negative result does not preclude SARS-Cov-2 infection and should not be used as the sole basis for treatment or other patient management decisions. A negative result may occur with  improper specimen collection/handling, submission of specimen other than nasopharyngeal swab, presence of viral mutation(s) within the areas targeted by this assay, and inadequate number of viral copies(<138 copies/mL). A negative result must be combined with clinical observations, patient history, and epidemiological information. The expected result is Negative.  Fact Sheet for Patients:  EntrepreneurPulse.com.au  Fact Sheet for Healthcare Providers:  IncredibleEmployment.be  This test is no t yet approved or cleared by the [place] FDA and  has been authorized for detection and/or diagnosis of SARS-CoV-2 by FDA under an Emergency Use Authorization (EUA). This EUA will remain  in effect (meaning this test can be used) for the duration of the COVID-19 declaration under Section 564(b)(1) of the Act, 21 U.S.C.section 360bbb-3(b)(1), unless the authorization is terminated  or revoked sooner.       Influenza A by PCR NEGATIVE NEGATIVE Final   Influenza B by PCR NEGATIVE NEGATIVE Final    Comment: (NOTE) The Xpert Xpress SARS-CoV-2/FLU/RSV plus assay is intended as an aid in the diagnosis of influenza from Nasopharyngeal swab specimens and should not be used as a sole basis for treatment. Nasal washings and aspirates are unacceptable for Xpert Xpress SARS-CoV-2/FLU/RSV testing.  Fact Sheet for Patients: EntrepreneurPulse.com.au  Fact Sheet for Healthcare Providers: IncredibleEmployment.be  This test is not yet approved or cleared by the [place] FDA and has been authorized for detection and/or diagnosis of SARS-CoV-2 by FDA under an Emergency Use Authorization  (EUA). This EUA will remain in effect (meaning this test can be used) for the duration of the COVID-19 declaration under Section 564(b)(1) of the Act, 21 U.S.C. section 360bbb-3(b)(1), unless the authorization is terminated or revoked.  Performed at [facility], [address], [place], [place] [address]     Time coordinating discharge: 36 minutes   SIGNED:  Irwin Brakeman, MD  [facility] 01/30/2021, 11:39 AM How to contact the [facility] Attending or Consulting provider [place] or covering provider during after hours [place], for this patient?  Check the care team in [facility] and look for a) attending/consulting [facility] provider listed and b) the [facility] team listed Log into [email or web address] and use [place] universal password to access. If you do not have the password, please contact the hospital operator. Locate the [facility] provider you are looking for under [facility] and page to a number that you can be directly reached. If you still have difficulty reaching the provider, please page the [facility] (Director on Call) for the Hospitalists listed on amion for assistance.

## 2021-01-30 NOTE — Care Management Important Message (Signed)
Important Message  Patient Details  Name: Julia Hart MRN: [id number] Date of Birth: [birth year]   Medicare Important Message Given:  Yes     Tommy Medal 01/30/2021, 11:13 AM

## 2021-01-30 NOTE — Progress Notes (Signed)
Nsg Discharge Note  Admit Date:  01/25/2021 Discharge date: 01/30/2021   [name] to be D/C'd Skilled nursing facility per MD order.  AVS completed.  Copy for chart, and copy for patient signed, and dated. Patient/caregiver able to verbalize understanding.  Discharge Medication: Allergies as of 01/30/2021   No Known Allergies      Medication List     STOP taking these medications    Entresto 97-103 MG Generic drug: sacubitril-valsartan       TAKE these medications    acetaminophen 325 MG tablet Commonly known as: TYLENOL Take 2 tablets (650 mg total) by mouth in the morning, at noon, and at bedtime. What changed:  when to take this reasons to take this   apixaban 2.5 MG Tabs tablet Commonly known as: ELIQUIS Take 1 tablet (2.5 mg total) by mouth 2 (two) times daily.   aspirin EC 81 MG tablet Take 1 tablet (81 mg total) by mouth daily with breakfast.   atorvastatin 20 MG tablet Commonly known as: LIPITOR Take 20 mg by mouth at bedtime.   diclofenac Sodium 1 % Gel Commonly known as: VOLTAREN Apply 2 g topically 2 (two) times daily as needed (cramping in legs).   empagliflozin 10 MG Tabs tablet Commonly known as: JARDIANCE Take 10 mg by mouth every morning.   feeding supplement Liqd Take 237 mLs by mouth 2 (two) times daily.   guaiFENesin 600 MG 12 hr tablet Commonly known as: MUCINEX Take 1 tablet (600 mg total) by mouth 2 (two) times daily as needed for up to 5 days for cough or to loosen phlegm.   HYDROcodone-acetaminophen 5-325 MG tablet Commonly known as: NORCO/VICODIN Take 1 tablet by mouth 3 (three) times daily as needed for severe pain.   metoprolol succinate 25 MG 24 hr tablet Commonly known as: TOPROL-XL Take 1 tablet (25 mg total) by mouth every morning.   nitroGLYCERIN 0.4 MG SL tablet Commonly known as: NITROSTAT Place 0.4 mg under the tongue every 5 (five) minutes as needed for chest pain.   pantoprazole 40 MG tablet Commonly known  as: PROTONIX Take 40 mg by mouth daily.   polyethylene glycol 17 g packet Commonly known as: MIRALAX / GLYCOLAX Take 17 g by mouth daily as needed for mild constipation.   senna-docusate 8.6-50 MG tablet Commonly known as: Senokot-S Take 2 tablets by mouth at bedtime.   spironolactone 25 MG tablet Commonly known as: ALDACTONE Take 12.5 mg by mouth daily.   Torsemide 40 MG Tabs Take 40 mg by mouth daily.   triamcinolone cream 0.1 % Commonly known as: KENALOG Apply 1 application topically 2 (two) times daily as needed (FOR RASH-ITCH (applied to back)).        Discharge Assessment: Vitals:   01/29/21 2053 01/30/21 0520  BP: (!) 124/53 (!) 118/53  Pulse: 91 93  Resp: 18 18  Temp: 97.9 F (36.6 C) 98.5 F (36.9 C)  SpO2: 100% 100%   Skin clean, dry and intact without evidence of skin break down, no evidence of skin tears noted. IV catheter discontinued intact. Site without signs and symptoms of complications - no redness or edema noted at insertion site, patient denies c/o pain - only slight tenderness at site.  Dressing with slight pressure applied.  D/c Instructions-Education: Discharge instructions given to patient/family with verbalized understanding. D/c education completed with patient/family including follow up instructions, medication list, d/c activities limitations if indicated, with other d/c instructions as indicated by MD - patient able to verbalize  understanding, all questions fully answered. Patient instructed to return to ED, call 911, or call MD for any changes in condition.  Patient escorted via [place], and D/C to [facility] via private auto.  Zachery Conch, RN 01/30/2021 3:36 PM

## 2021-01-30 NOTE — Plan of Care (Signed)

## 2021-01-30 NOTE — Discharge Instructions (Signed)
IMPORTANT INFORMATION: PAY CLOSE ATTENTION   PHYSICIAN DISCHARGE INSTRUCTIONS  Follow with Primary care provider  [name], MD  and other consultants as instructed by your Hospitalist Physician  [place] IF SYMPTOMS COME BACK, WORSEN OR NEW PROBLEM DEVELOPS   Please note: You were cared for by a hospitalist during your hospital stay. Every effort will be made to forward records to your primary care provider.  You can request that your primary care provider send for your hospital records if they have not received them.  Once you are discharged, your primary care physician will handle any further medical issues. Please note that NO REFILLS for any discharge medications will be authorized once you are discharged, as it is imperative that you return to your primary care physician (or establish a relationship with a primary care physician if you do not have one) for your post hospital discharge needs so that they can reassess your need for medications and monitor your lab values.  Please get a complete blood count and chemistry panel checked by your Primary MD at your next visit, and again as instructed by your Primary MD.  Get Medicines reviewed and adjusted: Please take all your medications with you for your next visit with your Primary MD  Laboratory/radiological data: Please request your Primary MD to go over all hospital tests and procedure/radiological results at the follow up, please ask your primary care provider to get all Hospital records sent to his/her office.  In some cases, they will be blood work, cultures and biopsy results pending at the time of your discharge. Please request that your primary care provider follow up on these results.  If you are diabetic, please bring your blood sugar readings with you to your follow up appointment with primary care.    Please call and make your follow up appointments as soon as possible.    Also Note  the following: If you experience worsening of your admission symptoms, develop shortness of breath, life threatening emergency, suicidal or homicidal thoughts you must seek medical attention immediately by calling 911 or calling your MD immediately  if symptoms less severe.  You must read complete instructions/literature along with all the possible adverse reactions/side effects for all the Medicines you take and that have been prescribed to you. Take any new Medicines after you have completely understood and accpet all the possible adverse reactions/side effects.   Do not drive when taking Pain medications or sleeping medications (Benzodiazepines)  Do not take more than prescribed Pain, Sleep and Anxiety Medications. It is not advisable to combine anxiety,sleep and pain medications without talking with your primary care practitioner  Special Instructions: If you have smoked or chewed Tobacco  in the last 2 yrs please stop smoking, stop any regular Alcohol  and or any Recreational drug use.  Wear Seat belts while driving.  Do not drive if taking any narcotic, mind altering or controlled substances or recreational drugs or alcohol.

## 2021-01-30 NOTE — TOC Transition Note (Signed)
Transition of Care [facility]) - CM/SW Discharge Note   Patient Details  Name: Julia Hart MRN: [id number] Date of Birth: 1926-11-19  [facility] CM/SW Contact:  Boneta Lucks, RN Phone Number: 01/30/2021, 1:27 PM   Clinical Narrative:   Patient admitted with acute on chronic Diastolic CHF. Patient is medically ready for discharge. Family accepting the SNF at [facility] in [place]. [name] approved for [name]- niece to transport patient to facility. RN to call report.   Final next level of care: Skilled Nursing Facility Barriers to Discharge: Barriers Resolved  Patient Goals and CMS Choice Patient states their goals for this hospitalization and ongoing recovery are:: to go to rehab CMS Medicare.gov Compare Post Acute Care list provided to:: Patient Represenative (must comment)   Discharge Placement             Patient chooses bed at:  [facility]) Patient to be transferred to facility by: Niece - [name] Name of family member notified: [name] - niece Patient and family notified of of transfer: 01/30/21  Discharge Plan and Services    Readmission Risk Interventions Readmission Risk Prevention Plan 01/30/2021 11/22/2020  Transportation Screening Complete Complete  PCP or Specialist Appt within 3-5 Days Complete -  HRI or Home Care Consult Complete Complete  Social Work Consult for [place] Planning/Counseling Complete Complete  Palliative Care Screening Not Applicable Not Applicable  Medication Review [employer]) Complete Complete  Some recent data might be hidden

## 2021-03-22 DEATH — deceased

## 2022-10-10 IMAGING — CT CT ABD-PELV W/O CM
2 of 4 series · 16 of 46 positions shown, 18 images · non-contrast
Comparison: CT 1 month ago 11/24/2020

CLINICAL DATA: [AGE] with left-sided abdominal pain.

EXAM:
CT ABDOMEN AND PELVIS WITHOUT CONTRAST
TECHNIQUE: Multidetector CT imaging of the abdomen and pelvis was performed
following the standard protocol without IV contrast.

[Series 2: axial st · axial · 0.80mm/px · z∈[+726,+1106]mm · 13 of 86 slices shown, 15 images]
[im 5/86  soft-tissue]
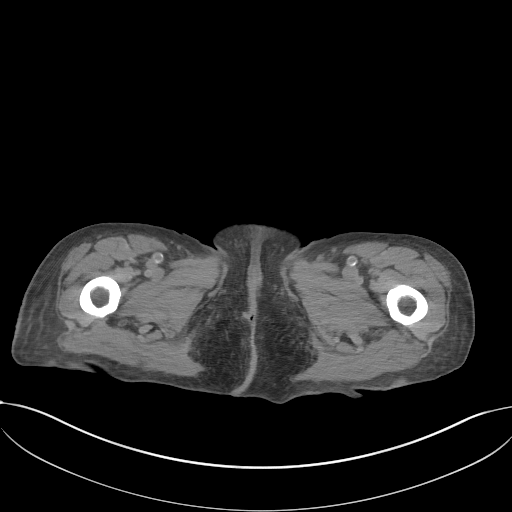
[im 5/86  bone]
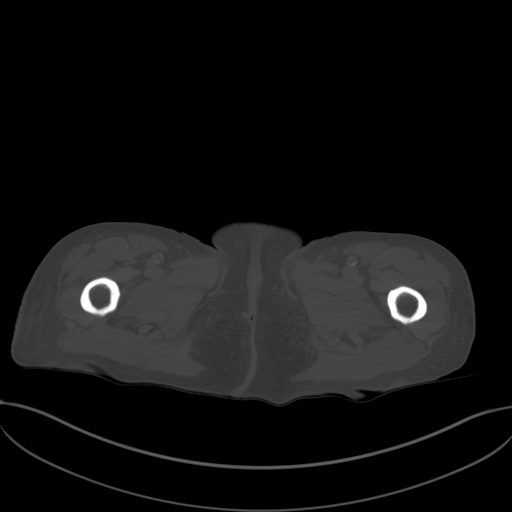
[im 14/86  soft-tissue]
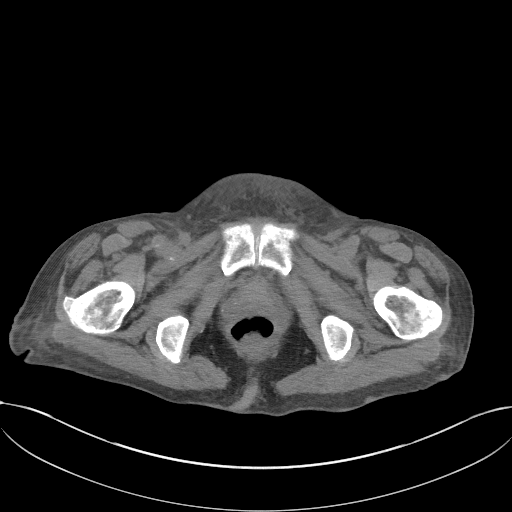
[im 18/86  soft-tissue]
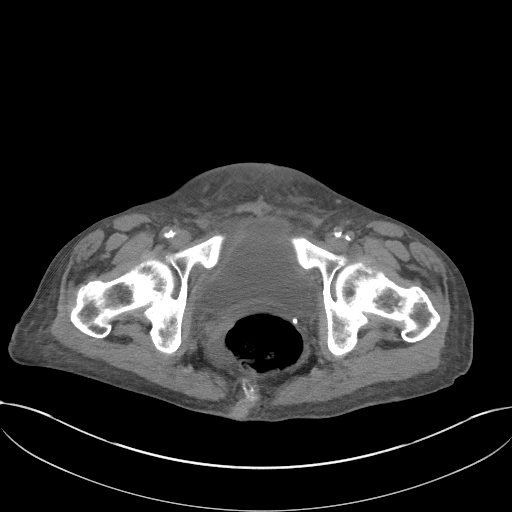
[im 23/86  soft-tissue]
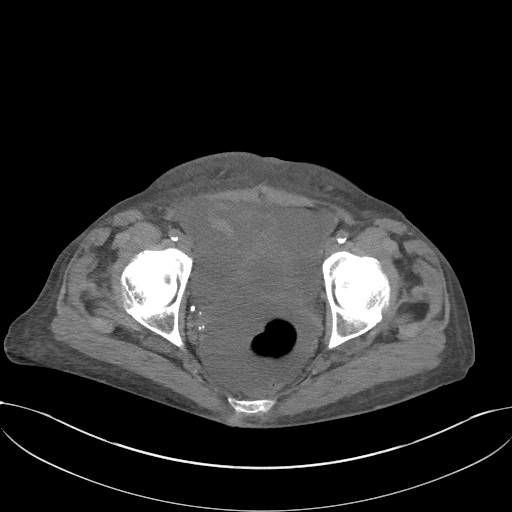
[im 32/86  soft-tissue]
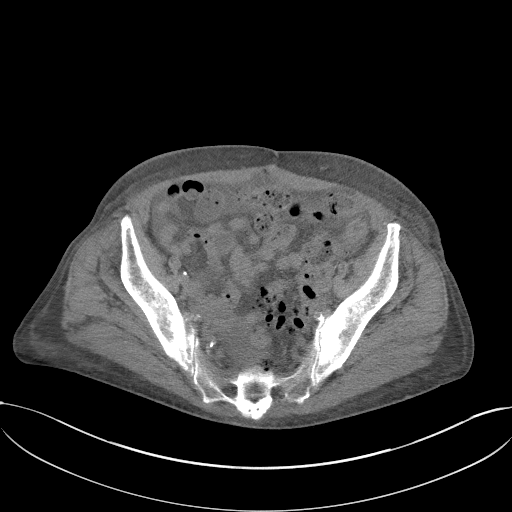
[im 36/86  soft-tissue]
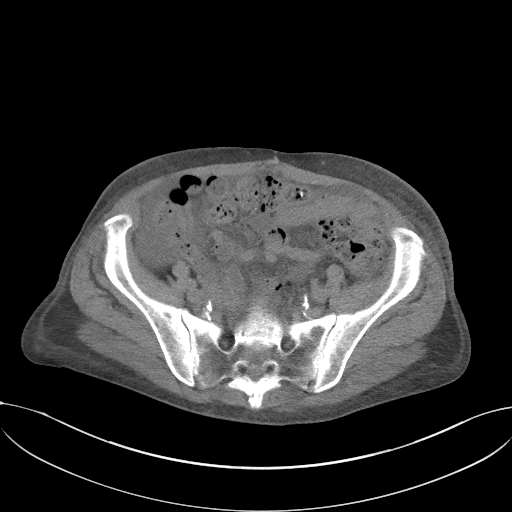
[im 45/86  soft-tissue]
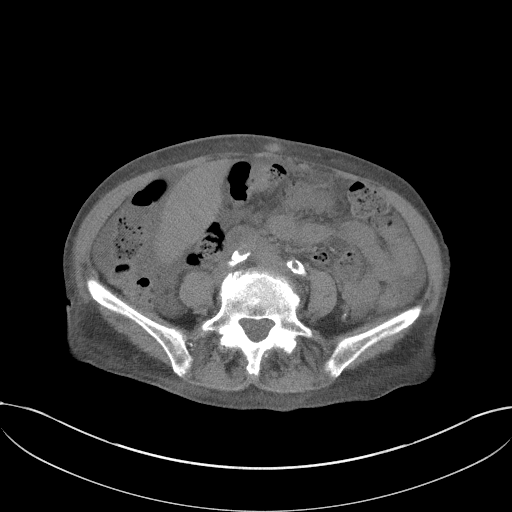
[im 50/86  soft-tissue]
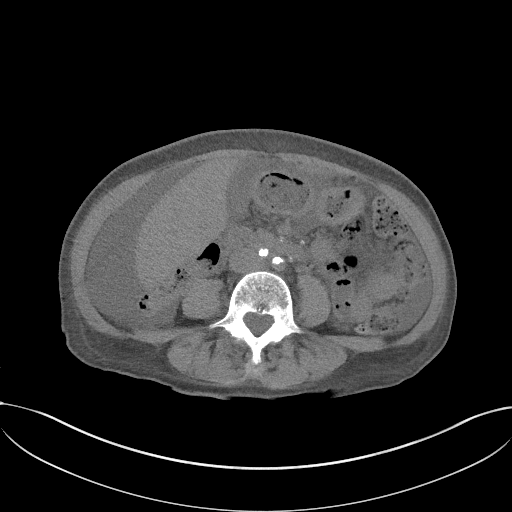
[im 54/86  soft-tissue]
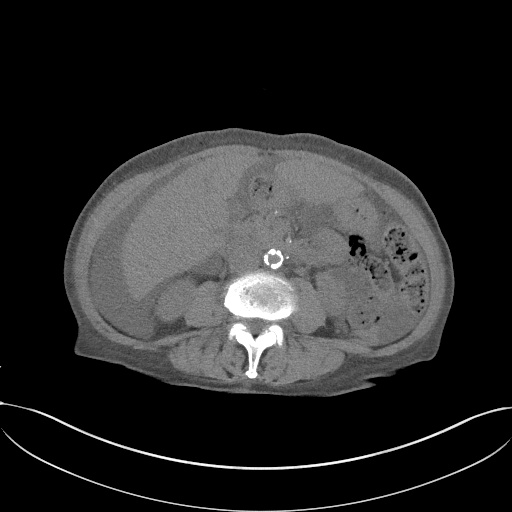
[im 54/86  bone]
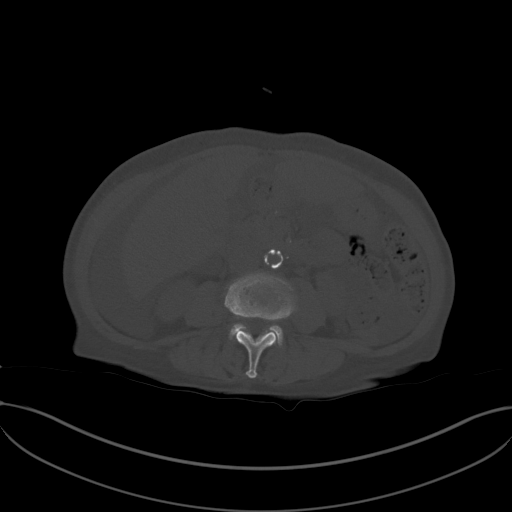
[im 63/86  soft-tissue]
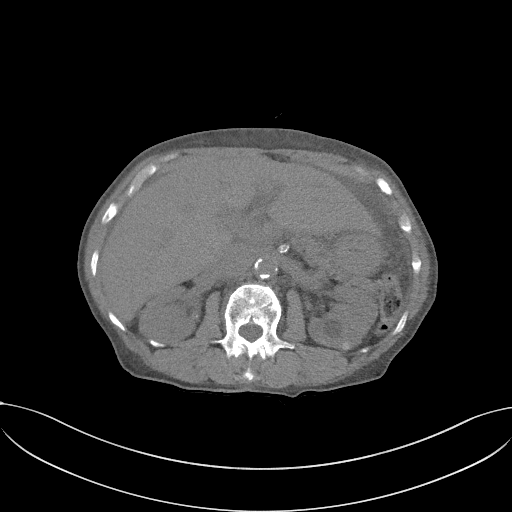
[im 68/86  soft-tissue]
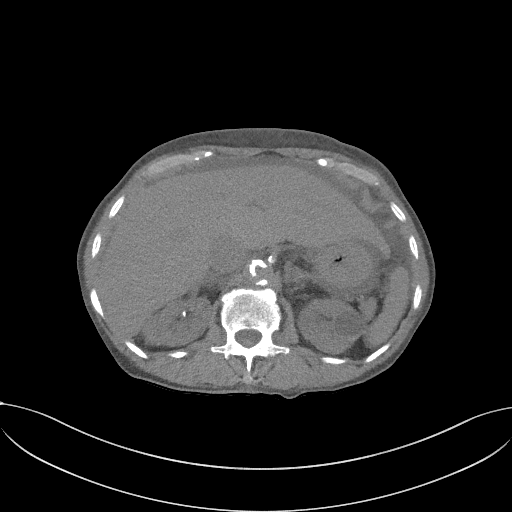
[im 72/86  soft-tissue]
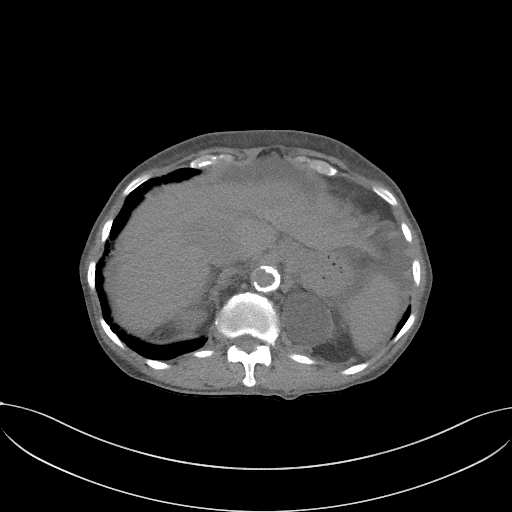
[im 81/86  soft-tissue]
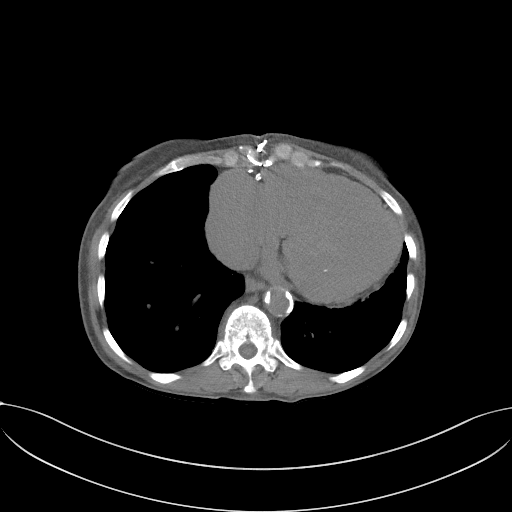

[Series 5: coronal st · coronal · 0.74mm/px · 3 of 93 slices shown]
[im 31/93  soft-tissue]
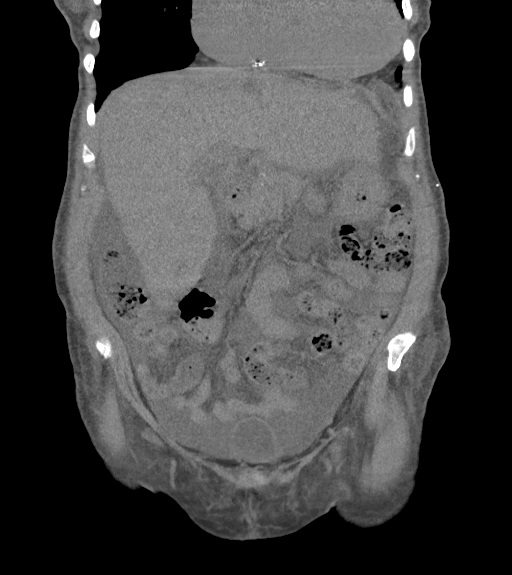
[im 41/93  soft-tissue]
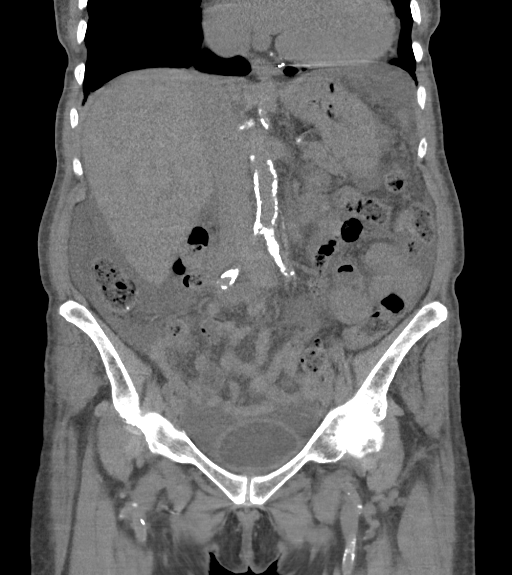
[im 52/93  soft-tissue]
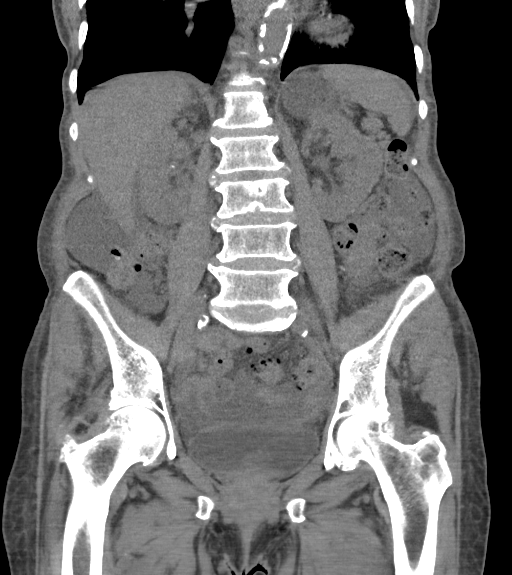

[16 of 46 positions shown; findings below may reference images not displayed]

FINDINGS: Lower chest: Again seen cardiomegaly and aortic atherosclerosis.
Cystic change in the left lung base is stable from prior exam.
Cystic change in the right lung on prior not included in the field
of view on the current exam. No acute airspace disease.

Hepatobiliary: No evidence of focal liver lesion on this noncontrast
exam. Questionable but not definite slight micro nodular hepatic
contours. Gallbladder tentatively visualized without calcified
gallstone, difficult to fully evaluate due to adjacent ascites.

Pancreas: Unremarkable noncontrast appearance.

Spleen: No splenomegaly.

Adrenals/Urinary Tract: No adrenal nodule. No hydronephrosis.
Calcifications at the renal hila appear to represent vascular
calcifications rather than renal calculi. These are not
significantly changed from prior. There are bilateral renal cysts.
Tiny hyperdense cyst in the posterior left kidney. Unremarkable
urinary bladder.

Stomach/Bowel: Decompressed stomach, unremarkable for degree of
distension. Small amount of ingested material in the distal stomach.
There is scattered fecalization of small bowel contents without
obstruction or inflammation. Appendix is not definitively
visualized. Moderate volume of colonic stool. Mild colonic
redundancy. Sigmoid colonic diverticulosis without focal
diverticulitis. No obvious colonic wall thickening or inflammation.

Vascular/Lymphatic: Advanced aortic and branch atherosclerosis. No
aortic aneurysm. Limited assessment for adenopathy on this
noncontrast exam given presence of ascites in generalized soft
tissue edema. No bulky abdominopelvic adenopathy seen.

Reproductive: Post hysterectomy.  No adnexal mass.

Other: Moderate volume abdominopelvic ascites with equivocal slight
increase from prior exam. There is generalized edema of the
subcutaneous and intra-abdominal fat. No free air. No abdominal wall
hernia.

Musculoskeletal: Multiple Schmorl's nodes in the lumbar spine.
Degenerative change of both sacroiliac joints. There are no acute or
suspicious osseous abnormalities.
IMPRESSION: 1. Moderate volume abdominopelvic ascites with equivocal slight
increase from CT last month. Generalized edema of the subcutaneous
and intra-abdominal fat. Findings consistent with third-spacing.
2. Colonic diverticulosis without diverticulitis.
3. Fecalization of small bowel contents without obstruction or
inflammation, suggesting slow transit.

Aortic Atherosclerosis ([id number]-TI2.2).

## 2022-11-10 IMAGING — DX DG CHEST 1V
1 series · 1 of 1 positions shown · non-contrast
Comparison: Chest x-ray 11/20/2020

CLINICAL DATA: Shortness of breath.  Left-sided abdominal pain.

EXAM:
CHEST  1 VIEW

[chest ap]
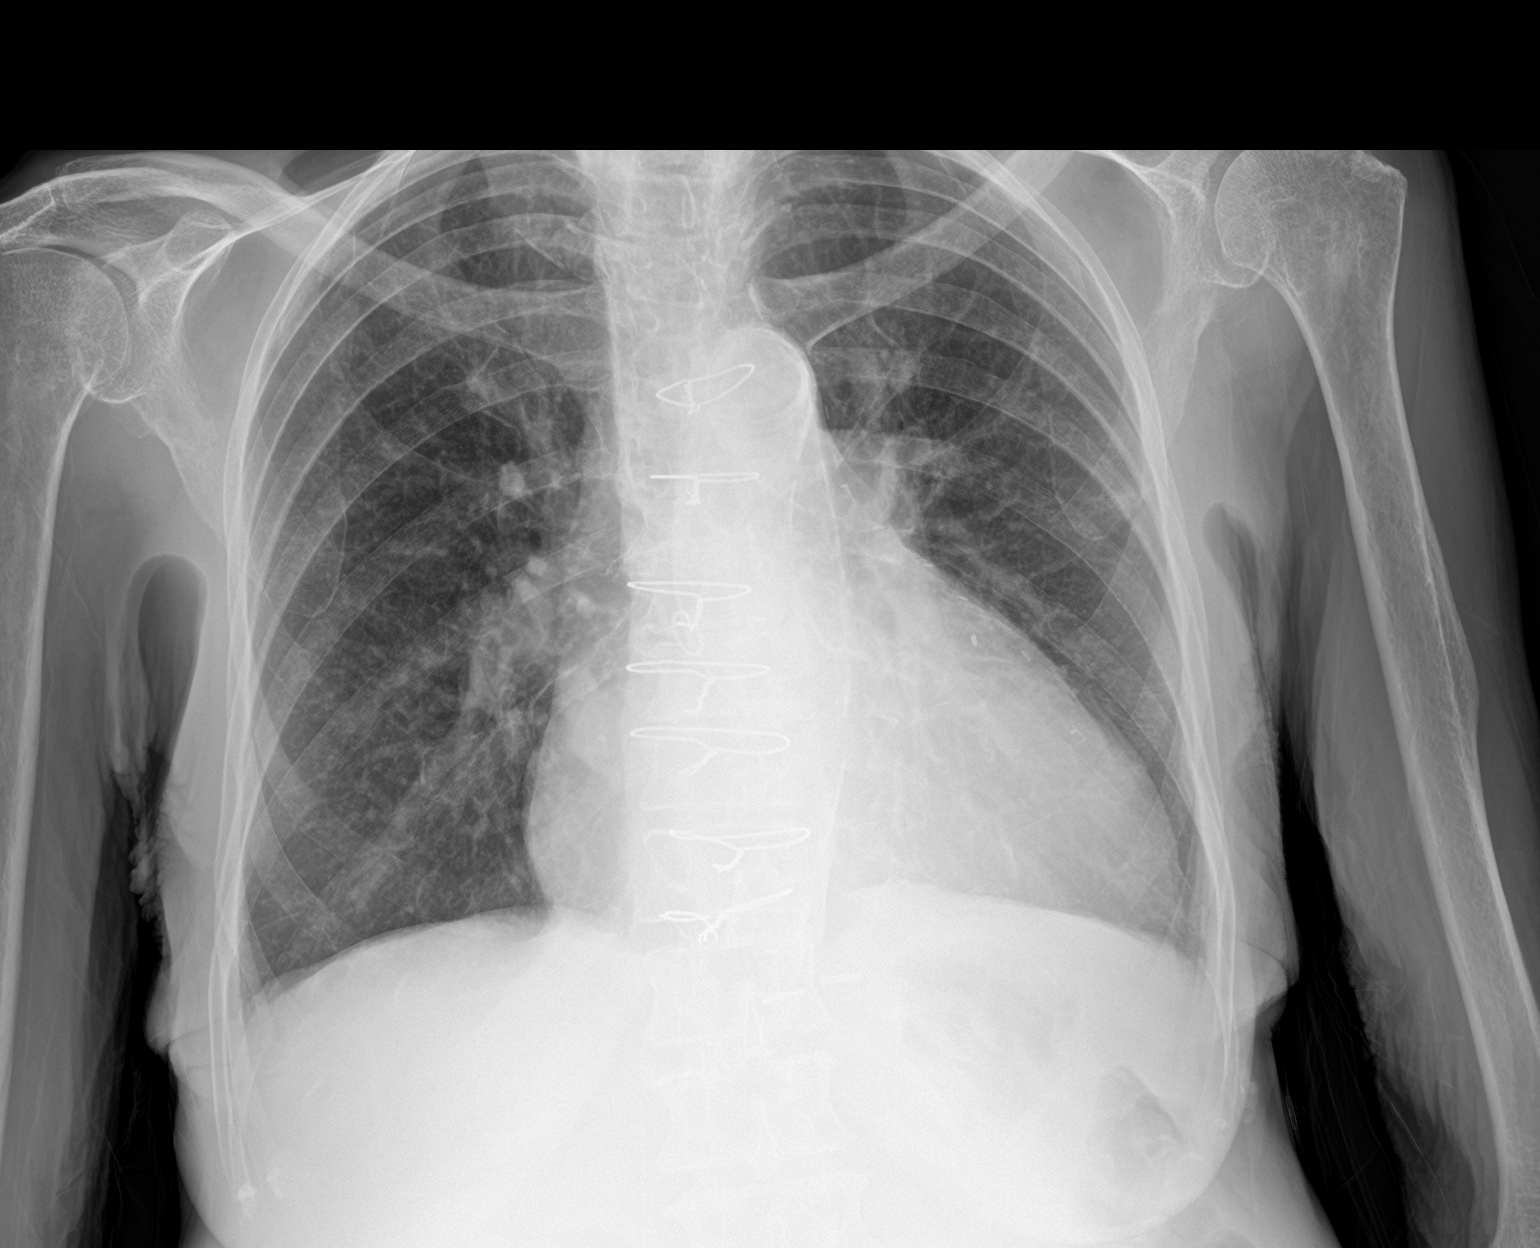

[1 of 1 positions shown; findings below may reference images not displayed]

FINDINGS: Enlarged cardiac silhouette. The heart size and mediastinal contours
are unchanged. Aortic arch calcification. Right neck surgical
changes overlie the mediastinum.

No focal consolidation. Persistent coarsened interstitial markings
with no overt pulmonary edema. Blunting of bilateral costophrenic
angles with no definite pleural effusion. No pneumothorax.

No acute osseous abnormality.
IMPRESSION: No active disease in a patient with persistent enlarged cardiac
silhouette.

## 2022-11-14 IMAGING — DX DG ABDOMEN 1V
1 series · 1 of 1 positions shown · non-contrast
Comparison: None

CLINICAL DATA: Abdominal pain and distension for 2 days

EXAM:
ABDOMEN - 1 VIEW

[abdomen supine]
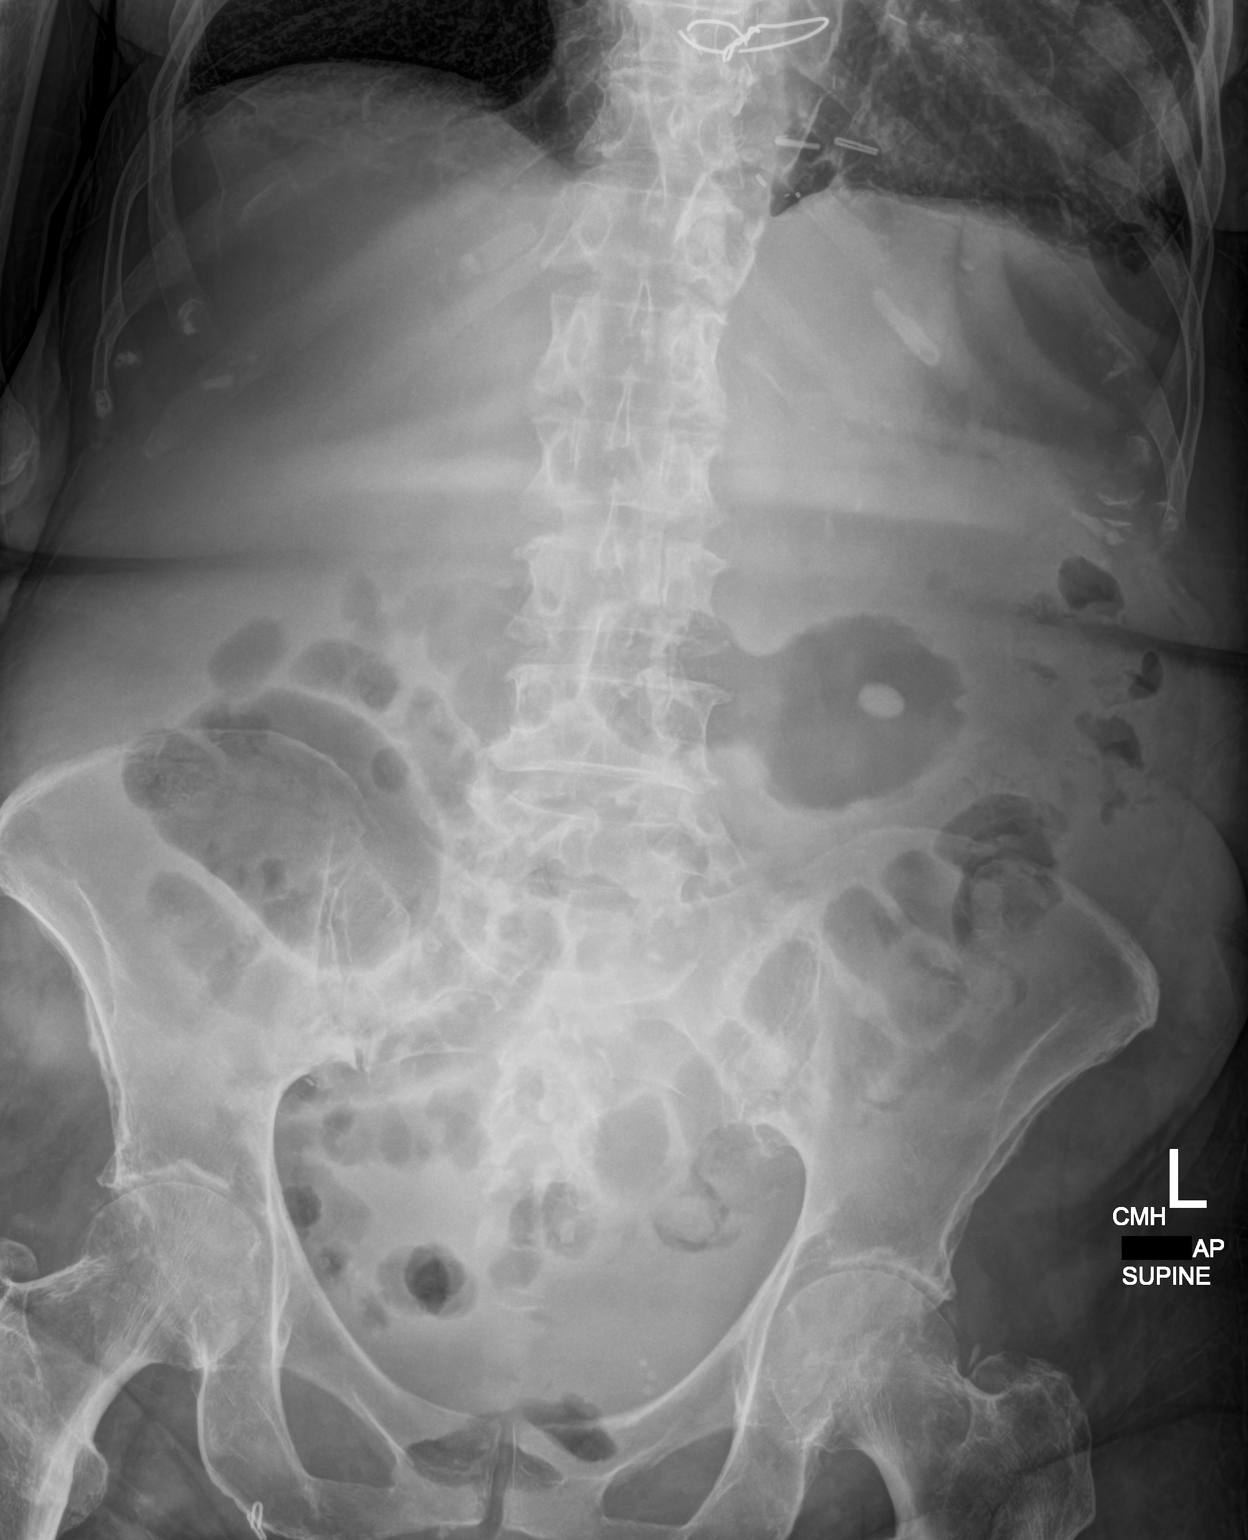

[1 of 1 positions shown; findings below may reference images not displayed]

FINDINGS: Probable medication tablet projecting over stomach in LEFT upper
quadrant.

Nonobstructive bowel gas pattern.

No bowel dilatation or bowel wall thickening.

Bones demineralized with degenerative changes of the hip joints
bilaterally.

Scattered atherosclerotic calcifications including aorta.

No definite urinary tract calcifications.
IMPRESSION: Nonobstructive bowel gas pattern.
